# Patient Record
Sex: Female | Born: 1960 | Race: Black or African American | Hispanic: No | Smoking: Never smoker
Health system: Southern US, Community
[De-identification: ages and names within clinical notes are randomized; demographics above are authoritative.]

## PROBLEM LIST (undated history)

## (undated) DIAGNOSIS — M5430 Sciatica, unspecified side: Secondary | ICD-10-CM

## (undated) DIAGNOSIS — R112 Nausea with vomiting, unspecified: Secondary | ICD-10-CM

## (undated) DIAGNOSIS — Z9889 Other specified postprocedural states: Secondary | ICD-10-CM

## (undated) DIAGNOSIS — E559 Vitamin D deficiency, unspecified: Secondary | ICD-10-CM

## (undated) DIAGNOSIS — M549 Dorsalgia, unspecified: Secondary | ICD-10-CM

## (undated) DIAGNOSIS — M5010 Cervical disc disorder with radiculopathy, unspecified cervical region: Secondary | ICD-10-CM

## (undated) HISTORY — PX: ABDOMINAL HYSTERECTOMY: SHX81

## (undated) HISTORY — DX: Vitamin D deficiency, unspecified: E55.9

## (undated) HISTORY — DX: Dorsalgia, unspecified: M54.9

## (undated) HISTORY — PX: FOOT SURGERY: SHX648

---

## 1999-04-29 ENCOUNTER — Other Ambulatory Visit: Admission: RE | Admit: 1999-04-29 | Discharge: 1999-04-29 | Payer: Self-pay | Admitting: Obstetrics and Gynecology

## 2000-05-02 ENCOUNTER — Other Ambulatory Visit: Admission: RE | Admit: 2000-05-02 | Discharge: 2000-05-02 | Payer: Self-pay | Admitting: Obstetrics and Gynecology

## 2001-01-19 ENCOUNTER — Encounter: Admission: RE | Admit: 2001-01-19 | Discharge: 2001-01-19 | Payer: Self-pay | Admitting: Emergency Medicine

## 2001-01-19 ENCOUNTER — Encounter: Payer: Self-pay | Admitting: Emergency Medicine

## 2001-04-12 ENCOUNTER — Other Ambulatory Visit: Admission: RE | Admit: 2001-04-12 | Discharge: 2001-04-12 | Payer: Self-pay | Admitting: Obstetrics and Gynecology

## 2001-10-26 ENCOUNTER — Encounter: Admission: RE | Admit: 2001-10-26 | Discharge: 2001-10-26 | Payer: Self-pay | Admitting: Emergency Medicine

## 2001-10-26 ENCOUNTER — Encounter: Payer: Self-pay | Admitting: Emergency Medicine

## 2002-04-23 ENCOUNTER — Other Ambulatory Visit: Admission: RE | Admit: 2002-04-23 | Discharge: 2002-04-23 | Payer: Self-pay | Admitting: Obstetrics and Gynecology

## 2003-05-22 ENCOUNTER — Other Ambulatory Visit: Admission: RE | Admit: 2003-05-22 | Discharge: 2003-05-22 | Payer: Self-pay | Admitting: Obstetrics and Gynecology

## 2003-07-20 ENCOUNTER — Encounter: Admission: RE | Admit: 2003-07-20 | Discharge: 2003-07-20 | Payer: Self-pay | Admitting: Emergency Medicine

## 2003-08-05 ENCOUNTER — Encounter: Admission: RE | Admit: 2003-08-05 | Discharge: 2003-08-05 | Payer: Self-pay | Admitting: Emergency Medicine

## 2003-08-19 ENCOUNTER — Encounter: Admission: RE | Admit: 2003-08-19 | Discharge: 2003-08-19 | Payer: Self-pay | Admitting: Emergency Medicine

## 2003-09-05 ENCOUNTER — Encounter: Admission: RE | Admit: 2003-09-05 | Discharge: 2003-09-05 | Payer: Self-pay | Admitting: Emergency Medicine

## 2003-11-06 ENCOUNTER — Other Ambulatory Visit: Admission: RE | Admit: 2003-11-06 | Discharge: 2003-11-06 | Payer: Self-pay | Admitting: Obstetrics and Gynecology

## 2004-03-02 ENCOUNTER — Other Ambulatory Visit: Admission: RE | Admit: 2004-03-02 | Discharge: 2004-03-02 | Payer: Self-pay | Admitting: Obstetrics and Gynecology

## 2004-08-07 ENCOUNTER — Other Ambulatory Visit: Admission: RE | Admit: 2004-08-07 | Discharge: 2004-08-07 | Payer: Self-pay | Admitting: Obstetrics and Gynecology

## 2005-01-20 ENCOUNTER — Encounter: Admission: RE | Admit: 2005-01-20 | Discharge: 2005-01-20 | Payer: Self-pay | Admitting: Emergency Medicine

## 2005-02-04 ENCOUNTER — Encounter: Admission: RE | Admit: 2005-02-04 | Discharge: 2005-02-04 | Payer: Self-pay | Admitting: Emergency Medicine

## 2005-02-22 ENCOUNTER — Other Ambulatory Visit: Admission: RE | Admit: 2005-02-22 | Discharge: 2005-02-22 | Payer: Self-pay | Admitting: Obstetrics and Gynecology

## 2005-03-02 ENCOUNTER — Encounter: Admission: RE | Admit: 2005-03-02 | Discharge: 2005-03-02 | Payer: Self-pay | Admitting: Emergency Medicine

## 2005-06-10 ENCOUNTER — Encounter: Admission: RE | Admit: 2005-06-10 | Discharge: 2005-06-10 | Payer: Self-pay | Admitting: Emergency Medicine

## 2006-11-01 ENCOUNTER — Encounter: Admission: RE | Admit: 2006-11-01 | Discharge: 2006-11-01 | Payer: Self-pay | Admitting: Obstetrics and Gynecology

## 2007-04-21 ENCOUNTER — Encounter: Admission: RE | Admit: 2007-04-21 | Discharge: 2007-04-21 | Payer: Self-pay | Admitting: Obstetrics and Gynecology

## 2007-10-25 ENCOUNTER — Encounter: Admission: RE | Admit: 2007-10-25 | Discharge: 2007-10-25 | Payer: Self-pay | Admitting: Obstetrics and Gynecology

## 2008-10-29 ENCOUNTER — Encounter: Admission: RE | Admit: 2008-10-29 | Discharge: 2008-10-29 | Payer: Self-pay | Admitting: Obstetrics and Gynecology

## 2009-10-30 ENCOUNTER — Encounter: Admission: RE | Admit: 2009-10-30 | Discharge: 2009-10-30 | Payer: Self-pay | Admitting: Obstetrics and Gynecology

## 2010-05-14 ENCOUNTER — Other Ambulatory Visit: Payer: Self-pay | Admitting: Obstetrics and Gynecology

## 2010-05-14 DIAGNOSIS — Z139 Encounter for screening, unspecified: Secondary | ICD-10-CM

## 2010-11-02 ENCOUNTER — Ambulatory Visit
Admission: RE | Admit: 2010-11-02 | Discharge: 2010-11-02 | Disposition: A | Discharge status: Home / Self Care | Payer: 59 | Source: Ambulatory Visit | Attending: Obstetrics and Gynecology | Admitting: Obstetrics and Gynecology

## 2010-11-02 DIAGNOSIS — Z139 Encounter for screening, unspecified: Secondary | ICD-10-CM

## 2011-09-30 ENCOUNTER — Other Ambulatory Visit: Payer: Self-pay | Admitting: Obstetrics and Gynecology

## 2011-09-30 DIAGNOSIS — Z1231 Encounter for screening mammogram for malignant neoplasm of breast: Secondary | ICD-10-CM

## 2011-11-03 ENCOUNTER — Ambulatory Visit
Admission: RE | Admit: 2011-11-03 | Discharge: 2011-11-03 | Disposition: A | Discharge status: Home / Self Care | Payer: 59 | Source: Ambulatory Visit | Attending: Obstetrics and Gynecology | Admitting: Obstetrics and Gynecology

## 2011-11-03 DIAGNOSIS — Z1231 Encounter for screening mammogram for malignant neoplasm of breast: Secondary | ICD-10-CM

## 2012-05-24 ENCOUNTER — Other Ambulatory Visit (HOSPITAL_COMMUNITY)
Admission: RE | Admit: 2012-05-24 | Discharge: 2012-05-24 | Disposition: A | Discharge status: Home / Self Care | Payer: 59 | Source: Ambulatory Visit | Attending: Obstetrics and Gynecology | Admitting: Obstetrics and Gynecology

## 2012-05-24 ENCOUNTER — Other Ambulatory Visit: Payer: Self-pay | Admitting: Obstetrics and Gynecology

## 2012-05-24 DIAGNOSIS — Z01419 Encounter for gynecological examination (general) (routine) without abnormal findings: Secondary | ICD-10-CM | POA: Insufficient documentation

## 2012-05-24 DIAGNOSIS — Z1151 Encounter for screening for human papillomavirus (HPV): Secondary | ICD-10-CM | POA: Insufficient documentation

## 2012-09-25 ENCOUNTER — Ambulatory Visit
Admission: RE | Admit: 2012-09-25 | Discharge: 2012-09-25 | Disposition: A | Discharge status: Home / Self Care | Payer: 59 | Source: Ambulatory Visit | Attending: Family Medicine | Admitting: Family Medicine

## 2012-09-25 ENCOUNTER — Ambulatory Visit (INDEPENDENT_AMBULATORY_CARE_PROVIDER_SITE_OTHER): Payer: 59 | Admitting: Sports Medicine

## 2012-09-25 VITALS — BP 115/78 | Ht 64.0 in | Wt 163.0 lb

## 2012-09-25 DIAGNOSIS — M542 Cervicalgia: Secondary | ICD-10-CM

## 2012-09-25 DIAGNOSIS — M545 Low back pain: Secondary | ICD-10-CM

## 2012-09-25 MED ORDER — CYCLOBENZAPRINE HCL 10 MG PO TABS: 10.0000 mg | ORAL_TABLET | Freq: Every evening | ORAL | Status: DC | PRN

## 2012-09-25 MED ORDER — KETOROLAC TROMETHAMINE 60 MG/2ML IM SOLN
60.0000 mg | Freq: Once | INTRAMUSCULAR | Status: AC
  Administered 2012-09-25: 60 mg via INTRAMUSCULAR

## 2012-09-25 MED ORDER — METHYLPREDNISOLONE ACETATE 80 MG/ML IJ SUSP
80.0000 mg | Freq: Once | INTRAMUSCULAR | Status: AC
  Administered 2012-09-25: 80 mg via INTRAMUSCULAR

## 2012-09-25 NOTE — Patient Instructions (Addendum)
Dear Mrs. Melissa Roth,   Thank you for coming in today. Please read the below about the issues that we discussed.  1. Neck pain - this is likely related to arthritis of the neck. We will get an x-ray to evaluate the bones of the neck. Additionally I will prescribed a muscle relaxer that you can take at night to help sleep. Also, I will provided note to you can take to work stating that you need a headset to use to decrease your neck pain.  2. Back pain - get another x-ray of your back today to evaluate the arthritis that could be causing the pain. Hopefully you will get some relief from the shot that you received today. We will contact you with the results of the x-ray.  Sincerely,   Dr. Margaretha Sheffield and Dr. Clinton Sawyer

## 2012-09-25 NOTE — Assessment & Plan Note (Signed)
Assessment: Chronic low back pain likely due to facet arthritis and degenerative disc disease but no sign of radiculopathy Plan: Obtain plain films of lumbar spine to assess degenerative disc disease and facet arthritis and likely recommend repeat steroid injections or facet since it was very helpful the past; patient also given IM injections of Depo-Medrol 80 mg and Toradol 60 mg for immediate pain relief today

## 2012-09-25 NOTE — Assessment & Plan Note (Signed)
Assessment: Paraspinal muscle pain likely due to positioning at work and also arthritis of cervical spine without evidence of radiculopathy or impingement Plan: obtain plain film x-rays of cervical spine, give prescription for Flexeril 10 mg each bedtime when necessary, provide a note for work recommending hands-free headset to use

## 2012-09-25 NOTE — Progress Notes (Signed)
  Subjective:    Patient ID: Melissa Roth, female    DOB: Mar 04, 1960, 52 y.o.   MRN: [id number]  HPI  52 year old female who presents for new patient visit for evaluation of lower back pain and neck pain.  Low back pain: Patient notes she had a history of low back pain since 2005. At that time she had a bulging disc seen on MRI. She has had injections of her spine with steroids which is very helpful. She did not have any pain for several years thereafter. However she started having worsening pain began over the last year. She describes the pain as being located in the lower back on both sides. It does not radiate to her buttocks or legs. It is worsened by sitting for prolonged period of time. She has seen a chiropractor in [place] for this problem. She feels like her treatment from a chiropractor had helped her for a few weeks at a time. However then the pain recurs. She denies any weakness of her legs any numbness or tingling of her perineum and no bowel or bladder dysfunction. She also denies any recent imaging.  Neck pain: As also started approximately a year ago. It is located in the muscles of her neck bilaterally. It does not radiate to her shoulders or her arms. She denies any numbness tingling or weakness of her upper exteremities. She thinks it is worsened by talking on the phone at work. She is a Science writer for [employer]. She has a talking on the phone for several hours a day makes it worse. It is mildly relieved by naproxen. She is not on any pain medications or anti-inflammatories.  Past medical history:  Degenerative disc disease at L4-L5 Disc herniation L3-L4  Facet arthritis  Past surgical history: Epidural steroid injections in 2005  SI joint injection in 2005 2007  facet joint injections in 2000 5007  No current outpatient prescriptions on file prior to visit.   No current facility-administered medications on file prior to visit.      Review of  Systems See history of present illness    Objective:   Physical Exam  BP 115/78  Ht 5\' 4"  (1.626 m)  Wt 163 lb (73.936 kg)  BMI 27.97 kg/m2 Gen. Middle-aged [place] American female, well-appearing, pleasant and conversant, overweight  Back:  Appearance: sciolosis no Palpation: tenderness of paraspinal muscles in the cervical and lumbar regions, spinous process no; pelvis no  Flexion: limited by tightness of muscles Extension: markedly limited by pain in the lumbar region FADIR: negative FABER: negative for pain but limited by range of motion  Neck:  Decreased flexion of the neck, normal extension left and right lateral rotations, negative Spurling bilaterally, tenderness palpation of paraspinal muscles and superior trapezius  Neuro: Strength bilateral hip flexion 5/5, hip abduction 5/5, hip adduction 5/5,  knee extension 5/5, knee flexion 5/5, dorsiflexion 5/5, plantar flexion 5/5 Reflexes: patella 2/2 Bilateral  Achilles 2/2 Bilateral Straight Leg Raise: negative Sensation to light touch: yes  Skin: warm and dry and no rashes      Assessment & Plan:

## 2012-09-27 ENCOUNTER — Encounter: Payer: Self-pay | Admitting: *Deleted

## 2012-09-27 ENCOUNTER — Telehealth: Payer: Self-pay | Admitting: *Deleted

## 2012-09-27 ENCOUNTER — Telehealth: Payer: Self-pay | Admitting: Sports Medicine

## 2012-09-27 DIAGNOSIS — M545 Low back pain: Secondary | ICD-10-CM

## 2012-09-27 NOTE — Telephone Encounter (Signed)
Message copied by Mora Bellman on Wed Sep 27, 2012  4:12 PM ------      Message from: Annita Brod      Created: Wed Sep 27, 2012  4:08 PM      Regarding: FW: voicemail message      Contact: [phone]                   ----- Message -----         From: Mora Bellman, RN         Sent: 09/27/2012   4:04 PM           To: Lillia Pauls, CMA      Subject: FW: voicemail message                                                ----- Message -----         From: [name], DO         Sent: 09/27/2012   3:57 PM           To: Mora Bellman, RN      Subject: FW: voicemail message                                    Pain could be coming from her neck were she has some degenerative changes.      Also tell her that we can proceed with repeat facet injections in her lumbar spine without pursuing a new MRI. We are setting that up at [facility] imaging and I would like to see her back after the injections.            ----- Message -----         From: Lillia Pauls, CMA         Sent: 09/27/2012   1:32 PM           To: [name], DO      Subject: FW: voicemail message                                                ----- Message -----         From: Lizbeth Bark         Sent: 09/27/2012   1:20 PM           To: Lillia Pauls, CMA      Subject: voicemail message                                        Pt called stating her left arm is painful.  She saw Dr. Margaretha Sheffield and wants to know if this could be caused by her back and neck pain.       ------

## 2012-09-27 NOTE — Telephone Encounter (Signed)
Spoke with pt- advised her of below message from Dr. Margaretha Sheffield.

## 2012-09-27 NOTE — Telephone Encounter (Signed)
Patient has evidence of C5-C6 degenerative disc disease in her neck as well as facet arthropathy in her lumbar spine. We spoke with [facility] imaging and they're willing to proceed with repeat lumbar facet injections without repeating an MRI. He seemed to get the most benefit from bilateral L3-L4 and L4-L5 injections previously. Patient will followup with me after those injections for check on her progress.

## 2012-09-27 NOTE — Progress Notes (Signed)
Patient ID: Melissa Roth, female   DOB: May 08, 1960, 52 y.o.   MRN: [id number]   Ordered per Dr. Margaretha Sheffield.  Sent to [place] at [facility].  She will call pt to schedule.

## 2012-09-29 ENCOUNTER — Other Ambulatory Visit: Payer: Self-pay | Admitting: Sports Medicine

## 2012-09-29 DIAGNOSIS — M545 Low back pain, unspecified: Secondary | ICD-10-CM

## 2012-10-03 ENCOUNTER — Other Ambulatory Visit: Payer: Self-pay

## 2012-10-03 DIAGNOSIS — Z1231 Encounter for screening mammogram for malignant neoplasm of breast: Secondary | ICD-10-CM

## 2012-10-05 ENCOUNTER — Other Ambulatory Visit: Payer: Self-pay | Admitting: Sports Medicine

## 2012-10-05 ENCOUNTER — Ambulatory Visit
Admission: RE | Admit: 2012-10-05 | Discharge: 2012-10-05 | Disposition: A | Discharge status: Home / Self Care | Payer: 59 | Source: Ambulatory Visit | Attending: Sports Medicine | Admitting: Sports Medicine

## 2012-10-05 VITALS — BP 107/59 | HR 63

## 2012-10-05 DIAGNOSIS — M545 Low back pain, unspecified: Secondary | ICD-10-CM

## 2012-10-05 MED ORDER — IOHEXOL 180 MG/ML  SOLN
1.0000 mL | Freq: Once | INTRAMUSCULAR | Status: AC | PRN
  Administered 2012-10-05: 4 mL via INTRA_ARTICULAR

## 2012-10-05 MED ORDER — METHYLPREDNISOLONE ACETATE 40 MG/ML INJ SUSP (RADIOLOG
120.0000 mg | Freq: Once | INTRAMUSCULAR | Status: AC
  Administered 2012-10-05: 120 mg via INTRA_ARTICULAR

## 2012-10-09 ENCOUNTER — Ambulatory Visit: Payer: 59 | Admitting: Sports Medicine

## 2012-10-16 ENCOUNTER — Ambulatory Visit (INDEPENDENT_AMBULATORY_CARE_PROVIDER_SITE_OTHER): Payer: 59 | Admitting: Sports Medicine

## 2012-10-16 VITALS — BP 116/78 | Ht 63.0 in | Wt 157.0 lb

## 2012-10-16 DIAGNOSIS — M25512 Pain in left shoulder: Secondary | ICD-10-CM

## 2012-10-16 DIAGNOSIS — M25519 Pain in unspecified shoulder: Secondary | ICD-10-CM

## 2012-10-16 MED ORDER — METHYLPREDNISOLONE ACETATE 40 MG/ML IJ SUSP
40.0000 mg | Freq: Once | INTRAMUSCULAR | Status: AC
  Administered 2012-10-16: 40 mg via INTRA_ARTICULAR

## 2012-10-17 NOTE — Progress Notes (Signed)
  Subjective:    Patient ID: Melissa Roth, female    DOB: Nov 07, 1960, 52 y.o.   MRN: [id number]  HPI chief complaint: Left shoulder pain  Patient comes in today with persistent left shoulder pain. Recent x-rays of her cervical spine showed moderate degenerative disc disease at C5-C6. X-rays of her lumbar spine showed facet arthropathy and she has responded very well to facet injections thus far. In regards to her left shoulder she is getting pain mainly along the posterior and lateral aspect of the shoulder which is most pronounced with reaching overhead or weight from her body. She denies any numbness or tingling down the left arm. He does get intermittent neck pain. We had recommended to her employer that she have a headset for work but they have yet to provide her with one. No recent trauma. She takes an occasional Flexeril at night.  Interim medical history is unchanged     Review of Systems     Objective:   Physical Exam Well-developed, well-nourished. No acute distress Cervical spine: She has good rotation to the left and right but pain with flexion of the neck. There is no tenderness to palpation along the lumbar midline but there is some tenderness along the left trapezius as well as the periscapular area. No gross focal neurological deficits of either upper extremity. Left shoulder: Active forward flexion is limited to 100 before pain limits any further movement. Active abduction is also to 100 before pain limits further movement. Good internal and external rotation. Positive empty can, positive Hawkins. Rotator cuff strength is 5/5 bilaterally. No tenderness over the bicipital groove.       Assessment & Plan:  Left shoulder pain likely secondary to rotator cuff tendinitis/bursitis versus referred pain from cervical degenerative disc disease Improved low back pain secondary to facet arthropathy status post facet injections  For diagnostic as well as therapeutic reasons I  recommended a left subacromial cortisone injection. If pain persists there after I may need to consider merits of further diagnostic imaging in the form of ultrasound of her left shoulder and possibly MRI of her cervical spine. She will followup for ongoing or recalcitrant issues.  Consent obtained and verified. Time-out conducted. Noted no overlying erythema, induration, or other signs of local infection. Skin prepped in a sterile fashion. Topical analgesic spray: Ethyl chloride. Joint: left subacromial space Needle: 25g 1.5 inch Completed without difficulty. Meds: 1cc (40mg ) depomedrol, 3cc 1% xylocaine  Advised to call if fevers/chills, erythema, induration, drainage, or persistent bleeding.

## 2012-11-03 ENCOUNTER — Ambulatory Visit
Admission: RE | Admit: 2012-11-03 | Discharge: 2012-11-03 | Disposition: A | Discharge status: Home / Self Care | Payer: 59 | Source: Ambulatory Visit

## 2012-11-03 DIAGNOSIS — Z1231 Encounter for screening mammogram for malignant neoplasm of breast: Secondary | ICD-10-CM

## 2012-11-07 ENCOUNTER — Other Ambulatory Visit: Payer: Self-pay | Admitting: Obstetrics and Gynecology

## 2012-11-07 DIAGNOSIS — R928 Other abnormal and inconclusive findings on diagnostic imaging of breast: Secondary | ICD-10-CM

## 2012-11-24 ENCOUNTER — Ambulatory Visit
Admission: RE | Admit: 2012-11-24 | Discharge: 2012-11-24 | Disposition: A | Discharge status: Home / Self Care | Payer: 59 | Source: Ambulatory Visit | Attending: Obstetrics and Gynecology | Admitting: Obstetrics and Gynecology

## 2012-11-24 DIAGNOSIS — R928 Other abnormal and inconclusive findings on diagnostic imaging of breast: Secondary | ICD-10-CM

## 2012-12-20 ENCOUNTER — Encounter: Payer: Self-pay | Admitting: Family Medicine

## 2012-12-20 ENCOUNTER — Ambulatory Visit (INDEPENDENT_AMBULATORY_CARE_PROVIDER_SITE_OTHER): Payer: 59 | Admitting: Family Medicine

## 2012-12-20 ENCOUNTER — Encounter (INDEPENDENT_AMBULATORY_CARE_PROVIDER_SITE_OTHER): Payer: Self-pay

## 2012-12-20 VITALS — BP 113/69 | HR 73 | Ht 63.0 in | Wt 150.0 lb

## 2012-12-20 DIAGNOSIS — M25512 Pain in left shoulder: Secondary | ICD-10-CM

## 2012-12-20 DIAGNOSIS — M25519 Pain in unspecified shoulder: Secondary | ICD-10-CM

## 2012-12-20 NOTE — Patient Instructions (Signed)
You have rotator cuff impingement Try to avoid painful activities (overhead activities, lifting with extended arm) as much as possible. Aleve 2 tabs twice a day with food OR ibuprofen 3 tabs three times a day with food for pain and inflammation. Can take tylenol in addition to this. Subacromial injection may be beneficial to help with pain and to decrease inflammation - we repeated this today. Do home exercise program with theraband and scapular stabilization exercises daily - these are very important for long term relief even if an injection was given - 3 sets of 10 once a day of each. Consider formal physical therapy. Consider nitro patches. Call me if you want to do either of these. Otherwise follow up in the [place] office in 6 weeks.

## 2012-12-24 ENCOUNTER — Encounter: Payer: Self-pay | Admitting: Family Medicine

## 2012-12-24 NOTE — Progress Notes (Signed)
Patient ID: Melissa Roth, female   DOB: 1960-11-08, 52 y.o.   MRN: [id number]  PCP: No primary provider on file.  Subjective:   HPI: Patient is a 52 y.o. female here for left shoulder pain.  Patient denies known injury. States she's having problems reaching behind and overhead. Had subacromial injection in [place] office in past which helped her. Has not done PT or a home exercise program. Has known facet arthropathy of cervical spine as well. States painful to dress. Massage helped. + night pain. No numbness/tingling.  History reviewed. No pertinent past medical history.  Current Outpatient Prescriptions on File Prior to Visit  Medication Sig Dispense Refill  . cyclobenzaprine (FLEXERIL) 10 MG tablet Take 1 tablet (10 mg total) by mouth at bedtime as needed for muscle spasms.  30 tablet  0   No current facility-administered medications on file prior to visit.    Past Surgical History  Procedure Laterality Date  . Abdominal hysterectomy      Allergies  Allergen Reactions  . Ivp Dye [Iodinated Diagnostic Agents]   . Sulfa Antibiotics     History   Social History  . Marital Status: Single    Spouse Name: N/A    Number of Children: N/A  . Years of Education: N/A   Occupational History  . Not on file.   Social History Main Topics  . Smoking status: Never Smoker   . Smokeless tobacco: Not on file  . Alcohol Use: Not on file  . Drug Use: Not on file  . Sexual Activity: Not on file   Other Topics Concern  . Not on file   Social History Narrative  . No narrative on file    Family History  Problem Relation Age of Onset  . Hyperlipidemia Mother   . Hypertension Mother   . Hypertension Father   . Hyperlipidemia Father   . Heart attack Father   . Hyperlipidemia Brother   . Hypertension Brother   . Diabetes Neg Hx   . Sudden death Neg Hx     BP 113/69  Pulse 73  Ht 5\' 3"  (1.6 m)  Wt 150 lb (68.04 kg)  BMI 26.58 kg/m2  Review of Systems: See  HPI above.    Objective:  Physical Exam:  Gen: NAD  Left shoulder: No swelling, ecchymoses.  No gross deformity. No TTP AC joint, biceps tendon. FROM with painful arc. Positive Hawkins, Neers. Negative Yergasons. Strength 4/5 with empty can, painful.  Negative resisted internal/external rotation, 5/5 strength. Negative apprehension. NV intact distally.    Assessment & Plan:  1. Left shoulder rotator cuff impingement - Start home exercise program.  Declined formal PT for now.  Repeated subacromial injection - did well with this in the past.  NSAIDs, avoid painful activities.  Consider PT, nitro patches if not improving.  F/u in 6 weeks.  After informed written consent, patient was seated on exam table. Left shoulder was prepped with alcohol swab and utilizing posterior approach, patient's left subacromial space was injected with 3:1 marcaine: depomedrol. Patient tolerated the procedure well without immediate complications.

## 2012-12-26 DIAGNOSIS — M25512 Pain in left shoulder: Secondary | ICD-10-CM | POA: Insufficient documentation

## 2012-12-26 NOTE — Assessment & Plan Note (Signed)
Left shoulder rotator cuff impingement - Start home exercise program.  Declined formal PT for now.  Repeated subacromial injection - did well with this in the past.  NSAIDs, avoid painful activities.  Consider PT, nitro patches if not improving.  F/u in 6 weeks.  After informed written consent, patient was seated on exam table. Left shoulder was prepped with alcohol swab and utilizing posterior approach, patient's left subacromial space was injected with 3:1 marcaine: depomedrol. Patient tolerated the procedure well without immediate complications.

## 2013-01-31 ENCOUNTER — Ambulatory Visit: Payer: 59 | Admitting: Sports Medicine

## 2013-02-06 ENCOUNTER — Ambulatory Visit: Payer: 59 | Admitting: Sports Medicine

## 2013-02-06 ENCOUNTER — Encounter: Payer: Self-pay | Admitting: Sports Medicine

## 2013-02-06 ENCOUNTER — Ambulatory Visit (INDEPENDENT_AMBULATORY_CARE_PROVIDER_SITE_OTHER): Payer: Managed Care, Other (non HMO) | Admitting: Sports Medicine

## 2013-02-06 VITALS — BP 116/78 | Ht 64.0 in | Wt 149.0 lb

## 2013-02-06 DIAGNOSIS — M25519 Pain in unspecified shoulder: Secondary | ICD-10-CM

## 2013-02-06 NOTE — Patient Instructions (Signed)
You have been scheduled for appointment for MRI of you shoulder on 02/08/13 at 9 am [facility] [address]  [phone]

## 2013-02-07 NOTE — Progress Notes (Signed)
   Subjective:    Patient ID: Melissa Roth, female    DOB: 1960/04/27, 53 y.o.   MRN: [id number]  HPI Patient comes in today with returning left shoulder pain. Pain has been present now for several months. She does receive some improvement with subacromial cortisone injections but her pain never completely resolves. She has had 2 injections over the past 6 months. She localizes her pain to the lateral shoulder. It is most noticeable with shoulder related activity such as reaching overhead or away from her body. She also gets intermittent neck pain. She has been taking over-the-counter Aleve for several weeks and participating in a home exercise program. Despite this her symptoms persist. She is getting weakness as well. She denies any recent trauma.  Past medical history and current medications are reviewed    Review of Systems     Objective:   Physical Exam Well-developed, well-nourished. No acute distress. Awake alert and oriented x3. Vital signs are reviewed.  Shoulder: Active forward flexion and abduction to 90. Passively I'm able to. Her to about 120. Positive empty can, positive Hawkins. Rotator cuff strength is 5/5 but reproducible of pain with resisted supraspinatus. No tenderness to palpation over the a.c. joint or over the bicipital groove. Good passive external rotation. Neurovascularly intact distally.       Assessment & Plan:  Persistent left shoulder pain worrisome for rotator cuff tear  MRI of the left shoulder specifically to rule out a rotator cuff tear which may need operative intervention. I will call her with those results once available. We will delineate further treatment based on those results.

## 2013-02-08 ENCOUNTER — Ambulatory Visit
Admission: RE | Admit: 2013-02-08 | Discharge: 2013-02-08 | Disposition: A | Discharge status: Home / Self Care | Payer: Managed Care, Other (non HMO) | Source: Ambulatory Visit | Attending: Sports Medicine | Admitting: Sports Medicine

## 2013-02-08 DIAGNOSIS — M25519 Pain in unspecified shoulder: Secondary | ICD-10-CM

## 2013-02-12 ENCOUNTER — Other Ambulatory Visit: Payer: Self-pay | Admitting: Sports Medicine

## 2013-02-12 ENCOUNTER — Telehealth: Payer: Self-pay | Admitting: Sports Medicine

## 2013-02-12 DIAGNOSIS — M25519 Pain in unspecified shoulder: Secondary | ICD-10-CM

## 2013-02-12 MED ORDER — MELOXICAM 15 MG PO TABS: 15.0000 mg | ORAL_TABLET | Freq: Every day | ORAL | Status: DC

## 2013-02-12 NOTE — Telephone Encounter (Signed)
I spoke with the patient on the phone after reviewing the MRI findings of her left shoulder. There is no evidence of rotator cuff tear. She does have some edema in the infraspinatus and teres minor muscles consistent with a strain here. Based on these findings I recommended that we start Mobic 15 mg daily and start with some physical therapy. Patient is to followup with me in 6 weeks. If she is able to regain better shoulder mobility but continues to complain of neck and arm pain then I may need to consider the merits of an MRI scan of her cervical spine to rule out referred pain from her cervical degenerative disc disease. She is reassured that she has no surgical pathology in her left shoulder.

## 2013-02-12 NOTE — Telephone Encounter (Signed)
Message copied by Ralene CorkRAPER, TIMOTHY R on Mon Feb 12, 2013  2:35 PM ------      Message from: Annita BrodMOORE, NEETON C      Created: Mon Feb 12, 2013 10:44 AM      Regarding: FW: mri results      Contact: [phone]                   ----- Message -----         From: Lizbeth BarkMelanie L Ceresi         Sent: 02/12/2013  10:19 AM           To: Lillia PaulsNeeton Moore, CMA      Subject: mri results                                              Pt called to get results of Left arm/shoulder mri ------

## 2013-02-27 ENCOUNTER — Ambulatory Visit: Payer: Managed Care, Other (non HMO) | Attending: Sports Medicine

## 2013-03-28 ENCOUNTER — Encounter: Payer: Self-pay | Admitting: Sports Medicine

## 2013-03-28 ENCOUNTER — Ambulatory Visit (INDEPENDENT_AMBULATORY_CARE_PROVIDER_SITE_OTHER): Payer: Managed Care, Other (non HMO) | Admitting: Sports Medicine

## 2013-03-28 VITALS — BP 109/68 | Ht 63.0 in | Wt 150.0 lb

## 2013-03-28 DIAGNOSIS — M5412 Radiculopathy, cervical region: Secondary | ICD-10-CM

## 2013-03-28 DIAGNOSIS — M5010 Cervical disc disorder with radiculopathy, unspecified cervical region: Secondary | ICD-10-CM

## 2013-03-28 DIAGNOSIS — M542 Cervicalgia: Secondary | ICD-10-CM

## 2013-03-28 MED ORDER — PREDNISONE 10 MG PO KIT: PACK | ORAL | Status: DC

## 2013-03-28 MED ORDER — GABAPENTIN 300 MG PO CAPS: 300.0000 mg | ORAL_CAPSULE | Freq: Every day | ORAL | Status: DC

## 2013-03-28 NOTE — Progress Notes (Signed)
   Subjective:    Patient ID: Melissa Roth, female    DOB: 01-19-1960, 53 y.o.   MRN: [id number]  HPI Patient comes in today to discuss MRI findings of her left shoulder. Other than findings to suggest a mild teres minor and infraspinatus muscle strain, the scan is basically unremarkable. No rotator cuff tear. No adhesive capsulitis. Biceps tendon is normal. No labral tear. No bursitis. She continues to struggle with debilitating diffuse pain in the left shoulder. Pain will radiate down her arm into her hand. There is associated numbness and tingling intermittently. Some weakness as well. Also some left-sided neck pain. Previous x-rays of her cervical spine showed advanced degenerative disc disease at C5-C6 with disc space narrowing and osteophyte formation. Cyclobenzaprine was somewhat helpful, but other medications including meloxicam have not been of any benefit.    Review of Systems As above    Objective:   Physical Exam Well-developed, well-nourished. No acute distress.  Left shoulder: There continues to be limited range of motion in all planes secondary to pain. She has good passive external rotation and abduction. Global weakness secondary to pain. Neurological exam: Reflexes appear to be trace but equal at the biceps, triceps, and brachial radialis tendons bilaterally. No atrophy. Again, there is diffuse weakness of the left upper extremity secondary to pain. Good radial and ulnar pulses.       Assessment & Plan:  Persistent diffuse left shoulder and arm pain, likely due to C5-C6 radiculopathy  Shoulder MRI is basically unremarkable and does not explain her level of pain. Therefore, I want to get an MRI scan of her cervical spine specifically to evaluate for a disc herniation at C5-C6. I will place her on a 6 day Sterapred Dosepak to take as directed and we will start Neurontin 300 mg at bedtime. I have discussed the possibility of switching back to cyclobenzaprine if the Neurontin  is ineffective and I also discussed the possibility of adding tramadol. I will call her after I reviewed the MRI of her cervical spine. If still struggling, future treatment could include cervical ESI's, physical therapy, or referral to neurosurgery.

## 2013-03-28 NOTE — Patient Instructions (Signed)
You have been scheduled for a MRI of your cervical spine on 03/31/13 at 6:30 am  [facility]  [address] [place]  [phone]  Gabapentin and prednisone dose pack [facility]

## 2013-03-31 ENCOUNTER — Ambulatory Visit
Admission: RE | Admit: 2013-03-31 | Discharge: 2013-03-31 | Disposition: A | Discharge status: Home / Self Care | Payer: Managed Care, Other (non HMO) | Source: Ambulatory Visit | Attending: Sports Medicine | Admitting: Sports Medicine

## 2013-03-31 DIAGNOSIS — M542 Cervicalgia: Secondary | ICD-10-CM

## 2013-04-05 ENCOUNTER — Encounter: Payer: Self-pay | Admitting: *Deleted

## 2013-04-05 NOTE — Patient Instructions (Signed)
DR Rosine [facility]LANDA[facility] [address] [place] [phone] MON APR 6TH AT 945A

## 2013-04-06 ENCOUNTER — Telehealth: Payer: Self-pay | Admitting: Sports Medicine

## 2013-04-06 NOTE — Telephone Encounter (Signed)
  Patient returns to the office today at my request. An MRI of her cervical spine shows moderate central stenosis at C5-C6 with broad-based disc osteophyte resulting in bilateral foraminal stenosis, left greater than right. The patient's radicular symptoms have improved with prednisone and Neurontin but her biggest complaint continues to be limited shoulder range of motion particularly internal rotation. Her shoulder exam today shows limited external rotation to about 70 but limited passive abduction to about 90. A recent MRI scan of her left shoulder showed only some mild muscle straining of the teres minor and infraspinatus muscles. No evidence of rotator cuff pathology or labral pathology.  Although the patient's MRI of the shoulder is fairly unremarkable, clinically I think she is getting adhesive capsulitis. I also think some of her symptoms, particularly her neck pain and radiating numbness and tingling into her hand, are a result of the degenerative cervical spine changes at C5-C6. Since her radicular symptoms seem to improve and her main complaint continues to be limited shoulder mobility I recommended consultation with Dr. Dion SaucierLandau to see if he agrees that there may be an element of adhesive capsulitis present. I had previously discussed the possibility of a cervical epidural steroid injection but I will await Dr. Shelba FlakeLandau's input prior to ordering this. I've asked the patient to contact me after her appointment with Dr. Dion SaucierLandau.

## 2013-04-13 ENCOUNTER — Ambulatory Visit (INDEPENDENT_AMBULATORY_CARE_PROVIDER_SITE_OTHER): Payer: Managed Care, Other (non HMO) | Admitting: Sports Medicine

## 2013-04-13 ENCOUNTER — Encounter: Payer: Self-pay | Admitting: Sports Medicine

## 2013-04-13 VITALS — BP 117/78 | HR 69 | Ht 63.0 in | Wt 150.0 lb

## 2013-04-13 DIAGNOSIS — M5412 Radiculopathy, cervical region: Secondary | ICD-10-CM

## 2013-04-13 DIAGNOSIS — M7500 Adhesive capsulitis of unspecified shoulder: Secondary | ICD-10-CM

## 2013-04-13 DIAGNOSIS — M5010 Cervical disc disorder with radiculopathy, unspecified cervical region: Secondary | ICD-10-CM

## 2013-04-13 NOTE — Progress Notes (Signed)
Patient ID: Melissa Roth, female   DOB: Jun 25, 1960, 53 y.o.   MRN: [id number]  Patient comes in today for a quick followup. She saw Dr. Dion SaucierLandau earlier this week. Dr. Dion SaucierLandau agrees that there is an element of adhesive capsulitis in her left shoulder. He gave her an intra-articular cortisone injection and a comprehensive home exercise program. She is following up with him in 4 weeks. The radiculopathy she was getting in her left arm has resolved. She is still left with some chronic left-sided neck pain however. She states it is intermittent and not every day.  For now, I have recommended that we take a watchful waiting approach in regards to her cervical spine issues. She no doubt has adhesive capsulitis and I think she should address this first with Dr. Dion SaucierLandau. I explained that she will likely have an element of chronic neck pain due to degenerative disc disease but with her absence of radiculopathy I do not think we need to pursue a cervical ESI at this point. However, if things change down the road, she will notify me and I can reconsider this. Patient is not interested in discussing a cervical spine fusion and I am in agreement with this plan.  Follow up PRN

## 2013-10-24 ENCOUNTER — Other Ambulatory Visit: Payer: Self-pay

## 2013-10-24 DIAGNOSIS — Z1239 Encounter for other screening for malignant neoplasm of breast: Secondary | ICD-10-CM

## 2013-11-12 ENCOUNTER — Ambulatory Visit
Admission: RE | Admit: 2013-11-12 | Discharge: 2013-11-12 | Disposition: A | Discharge status: Home / Self Care | Payer: Managed Care, Other (non HMO) | Source: Ambulatory Visit

## 2013-11-12 DIAGNOSIS — Z1239 Encounter for other screening for malignant neoplasm of breast: Secondary | ICD-10-CM

## 2014-10-09 ENCOUNTER — Other Ambulatory Visit: Payer: Self-pay

## 2014-10-09 DIAGNOSIS — Z1231 Encounter for screening mammogram for malignant neoplasm of breast: Secondary | ICD-10-CM

## 2014-11-15 ENCOUNTER — Ambulatory Visit: Payer: Managed Care, Other (non HMO)

## 2014-12-05 ENCOUNTER — Ambulatory Visit
Admission: RE | Admit: 2014-12-05 | Discharge: 2014-12-05 | Disposition: A | Discharge status: Home / Self Care | Payer: Managed Care, Other (non HMO) | Source: Ambulatory Visit

## 2014-12-05 DIAGNOSIS — Z1231 Encounter for screening mammogram for malignant neoplasm of breast: Secondary | ICD-10-CM

## 2015-11-17 ENCOUNTER — Other Ambulatory Visit: Payer: Self-pay | Admitting: Family Medicine

## 2015-11-17 DIAGNOSIS — Z1231 Encounter for screening mammogram for malignant neoplasm of breast: Secondary | ICD-10-CM

## 2015-12-22 ENCOUNTER — Ambulatory Visit
Admission: RE | Admit: 2015-12-22 | Discharge: 2015-12-22 | Disposition: A | Discharge status: Home / Self Care | Payer: Managed Care, Other (non HMO) | Source: Ambulatory Visit | Attending: Family Medicine | Admitting: Family Medicine

## 2015-12-22 DIAGNOSIS — Z1231 Encounter for screening mammogram for malignant neoplasm of breast: Secondary | ICD-10-CM

## 2016-02-02 ENCOUNTER — Ambulatory Visit (INDEPENDENT_AMBULATORY_CARE_PROVIDER_SITE_OTHER): Payer: Managed Care, Other (non HMO) | Admitting: Podiatry

## 2016-02-02 ENCOUNTER — Ambulatory Visit (INDEPENDENT_AMBULATORY_CARE_PROVIDER_SITE_OTHER): Payer: Managed Care, Other (non HMO)

## 2016-02-02 ENCOUNTER — Encounter: Payer: Self-pay | Admitting: Podiatry

## 2016-02-02 VITALS — BP 102/64 | HR 84 | Resp 18

## 2016-02-02 DIAGNOSIS — M722 Plantar fascial fibromatosis: Secondary | ICD-10-CM | POA: Diagnosis not present

## 2016-02-02 MED ORDER — MELOXICAM 15 MG PO TABS: 15.0000 mg | ORAL_TABLET | Freq: Every day | ORAL | 2 refills | Status: DC

## 2016-02-02 NOTE — Progress Notes (Signed)
   Subjective:    Patient ID: Melissa Roth, female    DOB: [birth year], 56 y.o.   MRN: [id number]  HPI  56 year old female presents the office they for concerns of bilateral heel pain which has been ongoing for about 3 weeks. She states the area is painful the morning when she first gets up after standing all day. She denies any recent injury or trauma. She states that she was treated for this several years ago she believes. Denies any swelling or redness. She's had no recent treatment for this. She denies any claudication symptoms. The pain does not wake her up at night. No other complaints at this time.   Review of Systems  All other systems reviewed and are negative.      Objective:   Physical Exam General: AAO x3, NAD  Dermatological: Skin is warm, dry and supple bilateral. Nails x 10 are well manicured; remaining integument appears unremarkable at this time. There are no open sores, no preulcerative lesions, no rash or signs of infection present.  Vascular: Dorsalis Pedis artery and Posterior Tibial artery pedal pulses are 2/4 bilateral with immedate capillary fill time. Pedal hair growth present.  There is no pain with calf compression, swelling, warmth, erythema.   Neruologic: Grossly intact via light touch bilateral. Vibratory intact via tuning fork bilateral. Protective threshold with Semmes Wienstein monofilament intact to all pedal sites bilateral.   Musculoskeletal: Tenderness to palpation along the plantar medial tubercle of the calcaneus at the insertion of plantar fascia on the left and right foot. There is no pain along the course of the plantar fascia within the arch of the foot. Plantar fascia appears to be intact. There is no pain with lateral compression of the calcaneus or pain with vibratory sensation. There is no pain along the course or insertion of the achilles tendon. No other areas of tenderness to bilateral lower extremities. Muscular strength 5/5 in all groups  tested bilateral.  Gait: Unassisted, Nonantalgic.      Assessment & Plan:  56 year old female bilateral heel pain, plantar fasciitis -Treatment options discussed including all alternatives, risks, and complications -Etiology of symptoms were discussed -X-rays were obtained and reviewed with the patient. No evidence of acute fracture identified. -Patient elects to proceed with steroid injection into the left and right heel. Under sterile skin preparation, a total of 2.5cc of kenalog 10, 0.5% Marcaine plain, and 2% lidocaine plain were infiltrated into the symptomatic area without complication. A band-aid was applied. Patient tolerated the injection well without complication. Post-injection care with discussed with the patient. Discussed with the patient to ice the area over the next couple of days to help prevent a steroid flare.  -Plantar fascial brace dispensed -Prescribed mobic. Discussed side effects of the medication and directed to stop if any are to occur and call the office.  -Stretching, icing daily -Discussed shoe gear modifications and orthotics -Follow-up in 3 weeks or sooner if any problems arise. In the meantime, encouraged to call the office with any questions, concerns, change in symptoms.   Ovid CurdMatthew Wagoner, DPM

## 2016-02-02 NOTE — Patient Instructions (Signed)

## 2016-02-03 DIAGNOSIS — M722 Plantar fascial fibromatosis: Secondary | ICD-10-CM | POA: Insufficient documentation

## 2016-02-03 MED ORDER — TRIAMCINOLONE ACETONIDE 10 MG/ML IJ SUSP
10.0000 mg | Freq: Once | INTRAMUSCULAR | Status: AC
  Administered 2016-02-03: 10 mg

## 2016-02-23 ENCOUNTER — Ambulatory Visit (INDEPENDENT_AMBULATORY_CARE_PROVIDER_SITE_OTHER): Payer: Managed Care, Other (non HMO) | Admitting: Podiatry

## 2016-02-23 ENCOUNTER — Encounter: Payer: Self-pay | Admitting: Podiatry

## 2016-02-23 VITALS — BP 114/68 | HR 69 | Resp 18

## 2016-02-23 DIAGNOSIS — M722 Plantar fascial fibromatosis: Secondary | ICD-10-CM

## 2016-02-23 MED ORDER — TRIAMCINOLONE ACETONIDE 10 MG/ML IJ SUSP
10.0000 mg | Freq: Once | INTRAMUSCULAR | Status: AC
  Administered 2016-02-23: 10 mg

## 2016-02-23 NOTE — Progress Notes (Signed)
Subjective: [name] presents to the office today for follow-up evaluation of bilateral heel pain. She states that she is doing much better but she is still having some mild pain to the heels. She has been stretching but not icing. The braces have been helping a lot.  Denies any systemic complaints such as fevers, chills, nausea, vomiting. No acute changes since last appointment, and no other complaints at this time.   Objective: AAO x3, NAD DP/PT pulses palpable bilaterally, CRT less than 3 seconds There is mild continued tenderness to palpation along the plantar medial tubercle of the calcaneus at the insertion of plantar fascia on the left and right foot. There is no pain along the course of the plantar fascia within the arch of the foot. Plantar fascia appears to be intact. There is no pain with lateral compression of the calcaneus or pain with vibratory sensation. There is no pain along the course or insertion of the achilles tendon. No other areas of tenderness to bilateral lower extremities. No open lesions or pre-ulcerative lesions.  No pain with calf compression, swelling, warmth, erythema  Assessment: Resolving bilateral heel pain- plantar fasciitis  Plan: -All treatment options discussed with the patient including all alternatives, risks, complications.  -Patient elects to proceed with steroid injection into the left and right heel. Under sterile skin preparation, a total of 2.5cc of kenalog 10, 0.5% Marcaine plain, and 2% lidocaine plain were infiltrated into the symptomatic area without complication. A band-aid was applied. Patient tolerated the injection well without complication. Post-injection care with discussed with the patient. Discussed with the patient to ice the area over the next couple of days to help prevent a steroid flare.  -Continue stretching, icing daily -Plantar fascial brace -Discussed orthotics -RTC in 4 weeks if symptoms not completely resolved or sooner if  needed.  -Patient encouraged to call the office with any questions, concerns, change in symptoms.   Melissa Roth, DPM

## 2016-06-15 ENCOUNTER — Other Ambulatory Visit: Payer: Self-pay | Admitting: Physical Medicine and Rehabilitation

## 2016-06-15 DIAGNOSIS — K7689 Other specified diseases of liver: Secondary | ICD-10-CM

## 2016-06-23 ENCOUNTER — Ambulatory Visit
Admission: RE | Admit: 2016-06-23 | Discharge: 2016-06-23 | Disposition: A | Discharge status: Home / Self Care | Payer: 59 | Source: Ambulatory Visit | Attending: Physical Medicine and Rehabilitation | Admitting: Physical Medicine and Rehabilitation

## 2016-06-23 DIAGNOSIS — K7689 Other specified diseases of liver: Secondary | ICD-10-CM

## 2016-10-14 ENCOUNTER — Emergency Department (HOSPITAL_BASED_OUTPATIENT_CLINIC_OR_DEPARTMENT_OTHER): Payer: 59

## 2016-10-14 ENCOUNTER — Emergency Department (HOSPITAL_BASED_OUTPATIENT_CLINIC_OR_DEPARTMENT_OTHER)
Admission: EM | Admit: 2016-10-14 | Discharge: 2016-10-14 | Disposition: A | Discharge status: Home / Self Care | Payer: 59 | Attending: Emergency Medicine | Admitting: Emergency Medicine

## 2016-10-14 ENCOUNTER — Encounter (HOSPITAL_BASED_OUTPATIENT_CLINIC_OR_DEPARTMENT_OTHER): Payer: Self-pay

## 2016-10-14 DIAGNOSIS — Z79899 Other long term (current) drug therapy: Secondary | ICD-10-CM | POA: Diagnosis not present

## 2016-10-14 DIAGNOSIS — R52 Pain, unspecified: Secondary | ICD-10-CM

## 2016-10-14 DIAGNOSIS — M79605 Pain in left leg: Secondary | ICD-10-CM | POA: Diagnosis present

## 2016-10-14 HISTORY — DX: Cervical disc disorder with radiculopathy, unspecified cervical region: M50.10

## 2016-10-14 HISTORY — DX: Sciatica, unspecified side: M54.30

## 2016-10-14 MED ORDER — OXYCODONE-ACETAMINOPHEN 5-325 MG PO TABS
1.0000 | ORAL_TABLET | Freq: Once | ORAL | Status: AC
  Administered 2016-10-14: 1 via ORAL
  Filled 2016-10-14: qty 1

## 2016-10-14 MED ORDER — TRAMADOL HCL 50 MG PO TABS: 50.0000 mg | ORAL_TABLET | Freq: Four times a day (QID) | ORAL | 0 refills | Status: DC | PRN

## 2016-10-14 MED ORDER — METHYLPREDNISOLONE 4 MG PO TBPK: ORAL_TABLET | ORAL | 0 refills | Status: DC

## 2016-10-14 NOTE — ED Triage Notes (Signed)
C/o pain to left LE-started yesterday-denies injury-slow limping gait

## 2016-10-14 NOTE — Discharge Instructions (Signed)
Please continue taking your NSAIDS and muscle relaxer. Have given you a steroid pack to take as directed. Please follow up with your orthopaedic doctor. If you develop and worsening symptoms return to the ED. Will give you short course of pain medicine to take tomorrow if you are not improving.

## 2016-10-15 NOTE — ED Provider Notes (Signed)
[facility] Provider Note   CSN: [id number] Arrival date & time: 10/14/16  2019     History   Chief Complaint Chief Complaint  Patient presents with  . Leg Pain    HPI Melissa Roth is a 56 y.o. female.  HPI 56 year old African-American female past medical history significant for sciatica and cervical disc radiculopathy presents to the emergency Department today with complaints of left hip pain that radiates down her left leg. Patient states that symptoms started after working out on the elliptical yesterday. States that pain was worse this morning when she got up. Pain is worse with movement and palpation. States that she did have a cold sweat this morning due to the pain and felt nauseated. Patient took ibuprofen and Flexeril at home with little relief. States that she called her orthopedic doctor but they were unable to see her today. States that she did have a history of sciatica and this feels somewhat similar however worse. Patient is able to open with a limp. Denies any other trauma. Patient denies any lower extremity paresthesias or weakness. Pt denies any ha, night sweats, hx of ivdu/cancer, loss or bowel or bladder, urinary retention, saddle paresthesias, lower extremity paresthesias, urinary symptoms.    Past Medical History:  Diagnosis Date  . Cervical disc disorder with radiculopathy of cervical region   . Sciatica     Patient Active Problem List   Diagnosis Date Noted  . Plantar fasciitis 02/03/2016  . Left shoulder pain 12/26/2012  . Low back pain 09/25/2012  . Cervical pain (neck) 09/25/2012    Past Surgical History:  Procedure Laterality Date  . ABDOMINAL HYSTERECTOMY      OB History    No data available       Home Medications    Prior to Admission medications   Medication Sig Start Date End Date Taking? Authorizing Provider  cyclobenzaprine (FLEXERIL) 10 MG tablet Take 1 tablet (10 mg total) by mouth at bedtime as needed for muscle  spasms. Patient not taking: Reported on 02/02/2016 09/25/12   [name], MD  fluconazole (DIFLUCAN) 150 MG tablet  03/09/13   [provider]  gabapentin (NEURONTIN) 300 MG capsule Take 1 capsule (300 mg total) by mouth at bedtime. Patient not taking: Reported on 02/02/2016 03/28/13   [name], DO  meloxicam (MOBIC) 15 MG tablet Take 1 tablet (15 mg total) by mouth daily. Patient not taking: Reported on 02/02/2016 02/12/13   [name], DO  meloxicam (MOBIC) 15 MG tablet Take 1 tablet (15 mg total) by mouth daily. 02/02/16 02/01/17  [name], DPM  methylPREDNISolone (MEDROL DOSEPAK) 4 MG TBPK tablet Take as directed 10/14/16   [name], PA-C  Multiple Vitamin (MULTIVITAMIN) capsule Take 1 capsule by mouth daily.    [provider]  traMADol (ULTRAM) 50 MG tablet Take 1 tablet (50 mg total) by mouth every 6 (six) hours as needed. 10/14/16   [name], PA-C  valACYclovir (VALTREX) 500 MG tablet  11/03/12   [provider]    Family History Family History  Problem Relation Age of Onset  . Hyperlipidemia Mother   . Hypertension Mother   . Hypertension Father   . Hyperlipidemia Father   . Heart attack Father   . Hyperlipidemia Brother   . Hypertension Brother   . Diabetes Neg Hx   . Sudden death Neg Hx     Social History Social History  Substance Use Topics  .  Smoking status: Never Smoker  . Smokeless tobacco: Never Used  . Alcohol use Yes     Comment: occ     Allergies   Ivp dye [iodinated diagnostic agents] and Sulfa antibiotics   Review of Systems Review of Systems  Constitutional: Negative for chills and fever.  Musculoskeletal: Positive for arthralgias, back pain, gait problem and myalgias. Negative for neck pain and neck stiffness.  Skin: Negative for color change.  Neurological: Negative for weakness and numbness.     Physical Exam Updated Vital Signs BP 129/72 (BP Location: Left Arm)    Pulse 80   Temp 98.2 F (36.8 C) (Oral)   Resp 18   Ht  (1.626 m)   Wt 79.8 kg (175 lb 14.8 oz)   SpO2 100%   BMI 30.20 kg/m   Physical Exam  Constitutional: She appears well-developed and well-nourished. No distress.  HENT:  Head: Normocephalic and atraumatic.  Eyes: Right eye exhibits no discharge. Left eye exhibits no discharge. No scleral icterus.  Neck: Normal range of motion.  Pulmonary/Chest: No respiratory distress.  Musculoskeletal: Normal range of motion.  Patient with pain to palpation of the left buttocks that radiates to the left posterior thigh. Full range of motion of all joints. No erythema, edema, warmth over the joints.  No midline T spine or L spine tenderness. No deformities or step offs noted. Full ROM. Pelvis is stable.  No lower extremity edema or calf tenderness.   Neurological: She is alert.  Strength 5 out of 5 in lower extremities. Sensation intact to all dermatomes. Patellar reflexes are normal.  Skin: Skin is warm and dry. Capillary refill takes less than 2 seconds. No pallor.  Psychiatric: Her behavior is normal. Judgment and thought content normal.  Nursing note and vitals reviewed.    ED Treatments / Results  Labs (all labs ordered are listed, but only abnormal results are displayed) Labs Reviewed - No data to display  EKG  EKG Interpretation None       Radiology US Venous Img Lower Unilateral Left  Result Date: 10/14/2016 CLINICAL DATA:  56 year old female with posterior left thigh pain for 1 day with no known injury. EXAM: LEFT LOWER EXTREMITY VENOUS DOPPLER ULTRASOUND TECHNIQUE: Gray-scale sonography with graded compression, as well as color Doppler and duplex ultrasound were performed to evaluate the lower extremity deep venous systems from the level of the common femoral vein and including the common femoral, femoral, profunda femoral, popliteal and calf veins including the posterior tibial, peroneal and gastrocnemius veins  when visible. The superficial great saphenous vein was also interrogated. Spectral Doppler was utilized to evaluate flow at rest and with distal augmentation maneuvers in the common femoral, femoral and popliteal veins. COMPARISON:  None. FINDINGS: Contralateral Common Femoral Vein: Respiratory phasicity is normal and symmetric with the symptomatic side. No evidence of thrombus. Normal compressibility. Common Femoral Vein: No evidence of thrombus. Normal compressibility, respiratory phasicity and response to augmentation. Saphenofemoral Junction: No evidence of thrombus. Normal compressibility and flow on color Doppler imaging. Profunda Femoral Vein: No evidence of thrombus. Normal compressibility and flow on color Doppler imaging. Femoral Vein: No evidence of thrombus. Normal compressibility, respiratory phasicity and response to augmentation. Popliteal Vein: No evidence of thrombus. Normal compressibility, respiratory phasicity and response to augmentation. Calf Veins: No evidence of thrombus. Normal compressibility and flow on color Doppler imaging. Superficial Great Saphenous Vein: No evidence of thrombus. Normal compressibility. Venous Reflux:  None. Other Findings: Spectral broadening on Doppler of both common femoral  veins, significance unclear. IMPRESSION: No evidence of left lower extremity deep venous thrombosis. Electronically Signed   By: Odessa Fleming M.D.   On: 10/14/2016 21:48    Procedures Procedures (including critical care time)  Medications Ordered in ED Medications  oxyCODONE-acetaminophen (PERCOCET/ROXICET) 5-325 MG per tablet 1 tablet (1 tablet Oral Given 10/14/16 2234)     Initial Impression / Assessment and Plan / ED Course  I have reviewed the triage vital signs and the nursing notes.  Pertinent labs & imaging results that were available during my care of the patient were reviewed by me and considered in my medical decision making (see chart for details).     Patient presents to  the ED with complaints of left hip pain. History of sciatic nerve pain. Patient is neurovascularly intact in all extremities. No focal neuro deficit. Patient denies any back pain. No red flag symptoms concerning for cauda equina. Patient's pain seems typical of a radicular pain. Encouraged her to continue the ibuprofen and Flexeril.  Ultrasound was obtained to rule out blood clot. This was unremarkable. Do not feel that any further imaging is indicated at this time. No signs of septic joint. We'll give patient pain medicine the ED. Encouraged symptomatic treatment at home. We'll give Medrol Dosepak. Close follow-up with her orthopedic doctor.  Pt is hemodynamically stable, in NAD, & able to ambulate in the ED. Evaluation does not show pathology that would require ongoing emergent intervention or inpatient treatment. I explained the diagnosis to the patient. Pain has been managed & has no complaints prior to dc. Pt is comfortable with above plan and is stable for discharge at this time. All questions were answered prior to disposition. Strict return precautions for f/u to the ED were discussed. Encouraged follow up with PCP.   Final Clinical Impressions(s) / ED Diagnoses   Final diagnoses:  Pain  Left leg pain    New Prescriptions Discharge Medication List as of 10/14/2016 10:32 PM    START taking these medications   Details  methylPREDNISolone (MEDROL DOSEPAK) 4 MG TBPK tablet Take as directed, Print    traMADol (ULTRAM) 50 MG tablet Take 1 tablet (50 mg total) by mouth every 6 (six) hours as needed., Starting Thu 10/14/2016, Print         [name], PA-C 10/15/16 0214    Vanetta Mulders, MD 10/15/16 2337

## 2016-10-18 ENCOUNTER — Other Ambulatory Visit: Payer: Self-pay | Admitting: Family Medicine

## 2016-10-18 DIAGNOSIS — Z0000 Encounter for general adult medical examination without abnormal findings: Secondary | ICD-10-CM

## 2016-11-02 NOTE — Pre-Procedure Instructions (Signed)
Jacky Kindlennie L Annunziato  11/02/2016      [id number] [place] - [address] [address] [address] [place] [id number] Phone: [phone] Fax: [phone]    Your procedure is scheduled on November 7  [facility] at [phone] A.M.  Call this number if you have problems the morning of surgery:  [phone]   Remember:  Do not eat food or drink liquids after midnight.  Continue all other medications as directed by your physician except follow these medication instructions before surgery   Take these medicines the morning of surgery with A SIP OF WATER  pregabalin (LYRICA traMADol (ULTRAM if needed ondansetron [facility]ZOFRAN) if needed  7 days prior to surgery STOP taking any Aspirin (unless otherwise instructed by your surgeon), Aleve, Naproxen, Ibuprofen, Motrin, Advil, Goody's, BC's, all herbal medications, fish oil, and all vitamins    Do not wear jewelry, make-up or nail polish.  Do not wear lotions, powders, or perfumes, or deoderant.  Do not shave 48 hours prior to surgery.  Men may shave face and neck.  Do not bring valuables to the hospital.  [facility] is not responsible for any belongings or valuables.  Contacts, dentures or bridgework may not be worn into surgery.  Leave your suitcase in the car.  After surgery it may be brought to your room.  For patients admitted to the hospital, discharge time will be determined by your treatment team.  Patients discharged the day of surgery will not be allowed to drive home.    Special instructions:   [facility]- Preparing For Surgery  Before surgery, you can play an important role. Because skin is not sterile, your skin needs to be as free of germs as possible. You can reduce the number of germs on your skin by washing with CHG (chlorahexidine gluconate) Soap before surgery.  CHG is an antiseptic cleaner which kills germs and bonds with the  skin to continue killing germs even after washing.  Please do not use if you have an allergy to CHG or antibacterial soaps. If your skin becomes reddened/irritated stop using the CHG.  Do not shave (including legs and underarms) for at least 48 hours prior to first CHG shower. It is OK to shave your face.  Please follow these instructions carefully.   1. Shower the NIGHT BEFORE SURGERY and the MORNING OF SURGERY with CHG.   2. If you chose to wash your hair, wash your hair first as usual with your normal shampoo.  3. After you shampoo, rinse your hair and body thoroughly to remove the shampoo.  4. Use CHG as you would any other liquid soap. You can apply CHG directly to the skin and wash gently with a scrungie or a clean washcloth.   5. Apply the CHG Soap to your body ONLY FROM THE NECK DOWN.  Do not use on open wounds or open sores. Avoid contact with your eyes, ears, mouth and genitals (private parts). Wash Face and genitals (private parts)  with your normal soap.  6. Wash thoroughly, paying special attention to the area where your surgery will be performed.  7. Thoroughly rinse your body with warm water from the neck down.  8. DO NOT shower/wash with your normal soap after using and rinsing off the CHG Soap.  9. Pat yourself dry with a CLEAN TOWEL.  10. Wear CLEAN PAJAMAS to bed the night before surgery, wear  comfortable clothes the morning of surgery  11. Place CLEAN SHEETS on your bed the night of your first shower and DO NOT SLEEP WITH PETS.    Day of Surgery: Do not apply any deodorants/lotions. Please wear clean clothes to the hospital/surgery center.      Please read over the following fact sheets that you were given.

## 2016-11-03 ENCOUNTER — Encounter (HOSPITAL_COMMUNITY): Payer: Self-pay

## 2016-11-03 ENCOUNTER — Encounter (HOSPITAL_COMMUNITY)
Admission: RE | Admit: 2016-11-03 | Discharge: 2016-11-03 | Disposition: A | Discharge status: Home / Self Care | Payer: 59 | Source: Ambulatory Visit | Attending: Orthopedic Surgery | Admitting: Orthopedic Surgery

## 2016-11-03 DIAGNOSIS — M4807 Spinal stenosis, lumbosacral region: Secondary | ICD-10-CM | POA: Insufficient documentation

## 2016-11-03 DIAGNOSIS — M5127 Other intervertebral disc displacement, lumbosacral region: Secondary | ICD-10-CM | POA: Insufficient documentation

## 2016-11-03 DIAGNOSIS — G588 Other specified mononeuropathies: Secondary | ICD-10-CM | POA: Diagnosis not present

## 2016-11-03 HISTORY — DX: Other specified postprocedural states: Z98.890

## 2016-11-03 HISTORY — DX: Other specified postprocedural states: R11.2

## 2016-11-03 LAB — BASIC METABOLIC PANEL
ANION GAP: 7 (ref 5–15)
BUN: 8 mg/dL (ref 6–20)
CO2: 27 mmol/L (ref 22–32)
Calcium: 9.1 mg/dL (ref 8.9–10.3)
Chloride: 108 mmol/L (ref 101–111)
Creatinine, Ser: 0.77 mg/dL (ref 0.44–1.00)
GFR calc Af Amer: >60 mL/min (ref >60)
Glucose, Bld: 99 mg/dL (ref 65–99)
POTASSIUM: 4.3 mmol/L (ref 3.5–5.1)
SODIUM: 142 mmol/L (ref 135–145)

## 2016-11-03 LAB — CBC
HEMATOCRIT: 40 % (ref 36.0–46.0)
HEMOGLOBIN: 13.2 g/dL (ref 12.0–15.0)
MCH: 30.6 pg (ref 26.0–34.0)
MCHC: 33 g/dL (ref 30.0–36.0)
MCV: 92.6 fL (ref 78.0–100.0)
Platelets: 256 10*3/uL (ref 150–400)
RBC: 4.32 MIL/uL (ref 3.87–5.11)
RDW: 13.3 % (ref 11.5–15.5)
WBC: 4.4 10*3/uL (ref 4.0–10.5)

## 2016-11-03 LAB — SURGICAL PCR SCREEN
MRSA, PCR: NEGATIVE
STAPHYLOCOCCUS AUREUS: NEGATIVE

## 2016-11-03 NOTE — Progress Notes (Signed)
PCP - Fransisca KaufmannElizabeth Barns Cardiologist -  denies  Chest x-ray - not needed EKG - requesting Stress Test - denies ECHO - denies Cardiac Cath - denies  Sending to anesthesia for review of records requested  Patient denies shortness of breath, fever, cough and chest pain at PAT appointment   Patient verbalized understanding of instructions that were given to them at the PAT appointment. Patient was also instructed that they will need to review over the PAT instructions again at home before surgery.

## 2016-11-04 NOTE — Progress Notes (Addendum)
Anesthesia Chart Review:  Pt is a 56 year old female scheduled for L5-S1 discectomy on 11/10/2016 with Venita Lickahari Brooks, MD  - PCP is Juluis RainierElizabeth Barnes, MD  PMH reviewed. Never smoker. BMI 29  Medications reviewed.   BP 119/69   Pulse 98   Temp 36.8 C   Resp 18   Ht 5\' 4"  (1.626 m)   Wt 170 lb 10.2 oz (77.4 kg)   SpO2 98%   BMI 29.29 kg/m   Preoperative labs reviewed.    EKG 10/28/16 (PCP's office): sinus rhythm   If no changes, I anticipate pt can proceed with surgery as scheduled.   Rica Mastngela Hanna Ra, FNP-BC [facility] Phone: [phone] 11/04/2016 1:33 PM

## 2016-11-10 ENCOUNTER — Encounter (HOSPITAL_COMMUNITY)
Admission: RE | Disposition: A | Discharge status: Home / Self Care | Payer: Self-pay | Source: Ambulatory Visit | Attending: Orthopedic Surgery

## 2016-11-10 ENCOUNTER — Ambulatory Visit (HOSPITAL_COMMUNITY): Payer: 59 | Admitting: Anesthesiology

## 2016-11-10 ENCOUNTER — Observation Stay (HOSPITAL_COMMUNITY)
Admission: RE | Admit: 2016-11-10 | Discharge: 2016-11-11 | Disposition: A | Discharge status: Home / Self Care | Payer: 59 | Source: Ambulatory Visit | Attending: Orthopedic Surgery | Admitting: Orthopedic Surgery

## 2016-11-10 ENCOUNTER — Other Ambulatory Visit: Payer: Self-pay

## 2016-11-10 ENCOUNTER — Ambulatory Visit (HOSPITAL_COMMUNITY): Payer: 59 | Admitting: Emergency Medicine

## 2016-11-10 ENCOUNTER — Ambulatory Visit (HOSPITAL_COMMUNITY): Payer: 59

## 2016-11-10 ENCOUNTER — Encounter (HOSPITAL_COMMUNITY): Payer: Self-pay | Admitting: *Deleted

## 2016-11-10 DIAGNOSIS — Z8249 Family history of ischemic heart disease and other diseases of the circulatory system: Secondary | ICD-10-CM | POA: Insufficient documentation

## 2016-11-10 DIAGNOSIS — Z419 Encounter for procedure for purposes other than remedying health state, unspecified: Secondary | ICD-10-CM

## 2016-11-10 DIAGNOSIS — Z882 Allergy status to sulfonamides status: Secondary | ICD-10-CM | POA: Insufficient documentation

## 2016-11-10 DIAGNOSIS — Z9889 Other specified postprocedural states: Secondary | ICD-10-CM

## 2016-11-10 DIAGNOSIS — M5117 Intervertebral disc disorders with radiculopathy, lumbosacral region: Principal | ICD-10-CM | POA: Insufficient documentation

## 2016-11-10 DIAGNOSIS — Z91041 Radiographic dye allergy status: Secondary | ICD-10-CM | POA: Insufficient documentation

## 2016-11-10 DIAGNOSIS — M50322 Other cervical disc degeneration at C5-C6 level: Secondary | ICD-10-CM | POA: Insufficient documentation

## 2016-11-10 DIAGNOSIS — Z79899 Other long term (current) drug therapy: Secondary | ICD-10-CM | POA: Insufficient documentation

## 2016-11-10 HISTORY — PX: LUMBAR LAMINECTOMY/DECOMPRESSION MICRODISCECTOMY: SHX5026

## 2016-11-10 SURGERY — LUMBAR LAMINECTOMY/DECOMPRESSION MICRODISCECTOMY 1 LEVEL
Anesthesia: General | Laterality: Left

## 2016-11-10 MED ORDER — MAGNESIUM CITRATE PO SOLN: 1.0000 | Freq: Once | ORAL | Status: DC | PRN

## 2016-11-10 MED ORDER — BUPIVACAINE-EPINEPHRINE 0.25% -1:200000 IJ SOLN
INTRAMUSCULAR | Status: DC | PRN
  Administered 2016-11-10: 10 mL

## 2016-11-10 MED ORDER — METHOCARBAMOL 1000 MG/10ML IJ SOLN
500.0000 mg | Freq: Four times a day (QID) | INTRAVENOUS | Status: DC | PRN
  Filled 2016-11-10: qty 5

## 2016-11-10 MED ORDER — SODIUM CHLORIDE 0.9% FLUSH: 3.0000 mL | INTRAVENOUS | Status: DC | PRN

## 2016-11-10 MED ORDER — SODIUM CHLORIDE 0.9% FLUSH: 3.0000 mL | Freq: Two times a day (BID) | INTRAVENOUS | Status: DC

## 2016-11-10 MED ORDER — ONDANSETRON HCL 4 MG/2ML IJ SOLN
INTRAMUSCULAR | Status: AC
  Filled 2016-11-10: qty 2

## 2016-11-10 MED ORDER — LIDOCAINE 2% (20 MG/ML) 5 ML SYRINGE
INTRAMUSCULAR | Status: AC
  Filled 2016-11-10: qty 5

## 2016-11-10 MED ORDER — OXYCODONE HCL 5 MG PO TABS
10.0000 mg | ORAL_TABLET | ORAL | Status: DC | PRN
  Administered 2016-11-10 – 2016-11-11 (×6): 10 mg via ORAL
  Filled 2016-11-10 (×5): qty 2

## 2016-11-10 MED ORDER — ONDANSETRON HCL 4 MG/2ML IJ SOLN
4.0000 mg | Freq: Four times a day (QID) | INTRAMUSCULAR | Status: DC | PRN
  Administered 2016-11-10: 4 mg via INTRAVENOUS
  Filled 2016-11-10: qty 2

## 2016-11-10 MED ORDER — ONDANSETRON HCL 4 MG/2ML IJ SOLN
INTRAMUSCULAR | Status: DC | PRN
  Administered 2016-11-10: 4 mg via INTRAVENOUS

## 2016-11-10 MED ORDER — PROPOFOL 10 MG/ML IV BOLUS
INTRAVENOUS | Status: AC
  Filled 2016-11-10: qty 20

## 2016-11-10 MED ORDER — THROMBIN (RECOMBINANT) 20000 UNITS EX SOLR
CUTANEOUS | Status: DC | PRN
  Administered 2016-11-10: 20000 [IU] via TOPICAL

## 2016-11-10 MED ORDER — ONDANSETRON HCL 4 MG PO TABS: 4.0000 mg | ORAL_TABLET | Freq: Four times a day (QID) | ORAL | Status: DC | PRN

## 2016-11-10 MED ORDER — DEXAMETHASONE SODIUM PHOSPHATE 10 MG/ML IJ SOLN
INTRAMUSCULAR | Status: AC
  Filled 2016-11-10: qty 1

## 2016-11-10 MED ORDER — PREGABALIN 75 MG PO CAPS
75.0000 mg | ORAL_CAPSULE | Freq: Two times a day (BID) | ORAL | Status: DC
  Administered 2016-11-10 – 2016-11-11 (×2): 75 mg via ORAL
  Filled 2016-11-10 (×2): qty 1

## 2016-11-10 MED ORDER — CEFAZOLIN SODIUM-DEXTROSE 2-4 GM/100ML-% IV SOLN
2.0000 g | Freq: Three times a day (TID) | INTRAVENOUS | Status: AC
  Administered 2016-11-10 – 2016-11-11 (×2): 2 g via INTRAVENOUS
  Filled 2016-11-10 (×2): qty 100

## 2016-11-10 MED ORDER — ACETAMINOPHEN 10 MG/ML IV SOLN
1000.0000 mg | Freq: Once | INTRAVENOUS | Status: AC
  Administered 2016-11-10: 1000 mg via INTRAVENOUS
  Filled 2016-11-10: qty 100

## 2016-11-10 MED ORDER — PROMETHAZINE HCL 25 MG/ML IJ SOLN: 6.2500 mg | INTRAMUSCULAR | Status: DC | PRN

## 2016-11-10 MED ORDER — LACTATED RINGERS IV SOLN
INTRAVENOUS | Status: DC
  Administered 2016-11-10 (×2): via INTRAVENOUS

## 2016-11-10 MED ORDER — FENTANYL CITRATE (PF) 100 MCG/2ML IJ SOLN
INTRAMUSCULAR | Status: DC | PRN
  Administered 2016-11-10: 50 ug via INTRAVENOUS
  Administered 2016-11-10: 150 ug via INTRAVENOUS
  Administered 2016-11-10: 50 ug via INTRAVENOUS

## 2016-11-10 MED ORDER — CEFAZOLIN SODIUM-DEXTROSE 2-4 GM/100ML-% IV SOLN
2.0000 g | INTRAVENOUS | Status: AC
  Administered 2016-11-10: 2 g via INTRAVENOUS

## 2016-11-10 MED ORDER — FENTANYL CITRATE (PF) 100 MCG/2ML IJ SOLN
INTRAMUSCULAR | Status: AC
  Administered 2016-11-10: 50 ug via INTRAVENOUS
  Filled 2016-11-10: qty 2

## 2016-11-10 MED ORDER — OXYCODONE HCL 5 MG PO TABS: 5.0000 mg | ORAL_TABLET | ORAL | Status: DC | PRN

## 2016-11-10 MED ORDER — ROCURONIUM BROMIDE 10 MG/ML (PF) SYRINGE
PREFILLED_SYRINGE | INTRAVENOUS | Status: AC
  Filled 2016-11-10: qty 5

## 2016-11-10 MED ORDER — MENTHOL 3 MG MT LOZG: 1.0000 | LOZENGE | OROMUCOSAL | Status: DC | PRN

## 2016-11-10 MED ORDER — METHOCARBAMOL 500 MG PO TABS
ORAL_TABLET | ORAL | Status: AC
  Filled 2016-11-10: qty 1

## 2016-11-10 MED ORDER — BACLOFEN 10 MG PO TABS: 10.0000 mg | ORAL_TABLET | Freq: Three times a day (TID) | ORAL | 1 refills | Status: AC

## 2016-11-10 MED ORDER — FAMOTIDINE IN NACL 20-0.9 MG/50ML-% IV SOLN: 20.0000 mg | Freq: Once | INTRAVENOUS | Status: DC

## 2016-11-10 MED ORDER — METHYLPREDNISOLONE ACETATE 40 MG/ML IJ SUSP
INTRAMUSCULAR | Status: AC
  Filled 2016-11-10: qty 1

## 2016-11-10 MED ORDER — METHOCARBAMOL 500 MG PO TABS
500.0000 mg | ORAL_TABLET | Freq: Four times a day (QID) | ORAL | Status: DC | PRN
  Administered 2016-11-10 – 2016-11-11 (×3): 500 mg via ORAL
  Filled 2016-11-10 (×2): qty 1

## 2016-11-10 MED ORDER — BUPIVACAINE-EPINEPHRINE (PF) 0.25% -1:200000 IJ SOLN
INTRAMUSCULAR | Status: AC
  Filled 2016-11-10: qty 30

## 2016-11-10 MED ORDER — ACETAMINOPHEN 650 MG RE SUPP: 650.0000 mg | RECTAL | Status: DC | PRN

## 2016-11-10 MED ORDER — PHENOL 1.4 % MT LIQD: 1.0000 | OROMUCOSAL | Status: DC | PRN

## 2016-11-10 MED ORDER — POLYETHYLENE GLYCOL 3350 17 G PO PACK: 17.0000 g | PACK | Freq: Every day | ORAL | Status: DC | PRN

## 2016-11-10 MED ORDER — PHENYLEPHRINE HCL 10 MG/ML IJ SOLN
INTRAMUSCULAR | Status: DC | PRN
  Administered 2016-11-10 (×6): 80 ug via INTRAVENOUS

## 2016-11-10 MED ORDER — METHYLPREDNISOLONE ACETATE 40 MG/ML IJ SUSP
INTRAMUSCULAR | Status: DC | PRN
  Administered 2016-11-10: 20 mg via INTRAMUSCULAR

## 2016-11-10 MED ORDER — LACTATED RINGERS IV SOLN
INTRAVENOUS | Status: DC
  Administered 2016-11-10: 16:00:00 via INTRAVENOUS

## 2016-11-10 MED ORDER — ROCURONIUM BROMIDE 100 MG/10ML IV SOLN
INTRAVENOUS | Status: DC | PRN
  Administered 2016-11-10: 50 mg via INTRAVENOUS

## 2016-11-10 MED ORDER — OXYCODONE-ACETAMINOPHEN 10-325 MG PO TABS: 1.0000 | ORAL_TABLET | ORAL | 0 refills | Status: AC | PRN

## 2016-11-10 MED ORDER — MIDAZOLAM HCL 5 MG/5ML IJ SOLN
INTRAMUSCULAR | Status: DC | PRN
  Administered 2016-11-10: 2 mg via INTRAVENOUS

## 2016-11-10 MED ORDER — CEFAZOLIN SODIUM-DEXTROSE 2-4 GM/100ML-% IV SOLN
INTRAVENOUS | Status: AC
  Filled 2016-11-10: qty 100

## 2016-11-10 MED ORDER — 0.9 % SODIUM CHLORIDE (POUR BTL) OPTIME
TOPICAL | Status: DC | PRN
  Administered 2016-11-10: 1000 mL

## 2016-11-10 MED ORDER — DEXAMETHASONE SODIUM PHOSPHATE 10 MG/ML IJ SOLN
INTRAMUSCULAR | Status: DC | PRN
  Administered 2016-11-10: 10 mg via INTRAVENOUS

## 2016-11-10 MED ORDER — PROPOFOL 10 MG/ML IV BOLUS
INTRAVENOUS | Status: DC | PRN
  Administered 2016-11-10: 150 mg via INTRAVENOUS

## 2016-11-10 MED ORDER — FENTANYL CITRATE (PF) 100 MCG/2ML IJ SOLN
25.0000 ug | INTRAMUSCULAR | Status: DC | PRN
  Administered 2016-11-10 (×2): 50 ug via INTRAVENOUS

## 2016-11-10 MED ORDER — DOCUSATE SODIUM 100 MG PO CAPS
100.0000 mg | ORAL_CAPSULE | Freq: Two times a day (BID) | ORAL | Status: DC
  Administered 2016-11-10 – 2016-11-11 (×2): 100 mg via ORAL
  Filled 2016-11-10 (×2): qty 1

## 2016-11-10 MED ORDER — LIDOCAINE HCL (CARDIAC) 20 MG/ML IV SOLN
INTRAVENOUS | Status: DC | PRN
  Administered 2016-11-10: 60 mg via INTRAVENOUS

## 2016-11-10 MED ORDER — OXYCODONE HCL 5 MG PO TABS
ORAL_TABLET | ORAL | Status: AC
Start: 2016-11-10 — End: 2016-11-11
  Filled 2016-11-10: qty 2

## 2016-11-10 MED ORDER — MIDAZOLAM HCL 2 MG/2ML IJ SOLN
INTRAMUSCULAR | Status: AC
  Filled 2016-11-10: qty 2

## 2016-11-10 MED ORDER — SUGAMMADEX SODIUM 200 MG/2ML IV SOLN
INTRAVENOUS | Status: AC
  Filled 2016-11-10: qty 2

## 2016-11-10 MED ORDER — HEMOSTATIC AGENTS (NO CHARGE) OPTIME
TOPICAL | Status: DC | PRN
  Administered 2016-11-10: 1 via TOPICAL

## 2016-11-10 MED ORDER — THROMBIN 20000 UNITS EX KIT
PACK | CUTANEOUS | Status: AC
  Filled 2016-11-10: qty 1

## 2016-11-10 MED ORDER — SUGAMMADEX SODIUM 200 MG/2ML IV SOLN
INTRAVENOUS | Status: DC | PRN
  Administered 2016-11-10: 175 mg via INTRAVENOUS

## 2016-11-10 MED ORDER — ACETAMINOPHEN 325 MG PO TABS: 650.0000 mg | ORAL_TABLET | ORAL | Status: DC | PRN

## 2016-11-10 MED ORDER — FENTANYL CITRATE (PF) 250 MCG/5ML IJ SOLN
INTRAMUSCULAR | Status: AC
Start: 2016-11-10 — End: 2016-11-10
  Filled 2016-11-10: qty 5

## 2016-11-10 SURGICAL SUPPLY — 65 items
BUR EGG ELITE 4.0 (BURR) IMPLANT
BUR EGG ELITE 4.0MM (BURR)
BUR MATCHSTICK NEURO 3.0 LAGG (BURR) IMPLANT
CANISTER SUCT 3000ML PPV (MISCELLANEOUS) ×3 IMPLANT
CLOSURE STERI-STRIP 1/2X4 (GAUZE/BANDAGES/DRESSINGS) ×1
CLSR STERI-STRIP ANTIMIC 1/2X4 (GAUZE/BANDAGES/DRESSINGS) ×2 IMPLANT
CORD BI POLAR (MISCELLANEOUS) ×3 IMPLANT
COVER SURGICAL LIGHT HANDLE (MISCELLANEOUS) ×3 IMPLANT
DRAIN CHANNEL 15F RND FF W/TCR (WOUND CARE) IMPLANT
DRAPE POUCH INSTRU U-SHP 10X18 (DRAPES) ×3 IMPLANT
DRAPE SURG 17X23 STRL (DRAPES) ×3 IMPLANT
DRAPE U-SHAPE 47X51 STRL (DRAPES) ×3 IMPLANT
DRSG AQUACEL AG ADV 3.5X 4 (GAUZE/BANDAGES/DRESSINGS) IMPLANT
DRSG AQUACEL AG ADV 3.5X 6 (GAUZE/BANDAGES/DRESSINGS) ×3 IMPLANT
DRSG OPSITE POSTOP 4X6 (GAUZE/BANDAGES/DRESSINGS) ×3 IMPLANT
DURAPREP 26ML APPLICATOR (WOUND CARE) ×3 IMPLANT
ELECT BLADE 4.0 EZ CLEAN MEGAD (MISCELLANEOUS)
ELECT CAUTERY BLADE 6.4 (BLADE) ×3 IMPLANT
ELECT PENCIL ROCKER SW 15FT (MISCELLANEOUS) ×3 IMPLANT
ELECT REM PT RETURN 9FT ADLT (ELECTROSURGICAL) ×3
ELECTRODE BLDE 4.0 EZ CLN MEGD (MISCELLANEOUS) IMPLANT
ELECTRODE REM PT RTRN 9FT ADLT (ELECTROSURGICAL) ×1 IMPLANT
EVACUATOR SILICONE 100CC (DRAIN) IMPLANT
GLOVE BIO SURGEON STRL SZ 6.5 (GLOVE) ×2 IMPLANT
GLOVE BIO SURGEONS STRL SZ 6.5 (GLOVE) ×1
GLOVE BIOGEL PI IND STRL 6.5 (GLOVE) ×1 IMPLANT
GLOVE BIOGEL PI IND STRL 8.5 (GLOVE) ×2 IMPLANT
GLOVE BIOGEL PI INDICATOR 6.5 (GLOVE) ×2
GLOVE BIOGEL PI INDICATOR 8.5 (GLOVE) ×4
GLOVE SS BIOGEL STRL SZ 8.5 (GLOVE) ×1 IMPLANT
GLOVE SUPERSENSE BIOGEL SZ 8.5 (GLOVE) ×2
GOWN STRL REUS W/TWL 2XL LVL3 (GOWN DISPOSABLE) ×6 IMPLANT
KIT BASIN OR (CUSTOM PROCEDURE TRAY) ×3 IMPLANT
KIT ROOM TURNOVER OR (KITS) ×3 IMPLANT
NEEDLE 22X1 1/2 (OR ONLY) (NEEDLE) ×6 IMPLANT
NEEDLE SPNL 18GX3.5 QUINCKE PK (NEEDLE) ×6 IMPLANT
NS IRRIG 1000ML POUR BTL (IV SOLUTION) ×3 IMPLANT
PACK LAMINECTOMY ORTHO (CUSTOM PROCEDURE TRAY) ×3 IMPLANT
PACK UNIVERSAL I (CUSTOM PROCEDURE TRAY) ×3 IMPLANT
PAD ARMBOARD 7.5X6 YLW CONV (MISCELLANEOUS) ×6 IMPLANT
PATTIES SURGICAL .5 X.5 (GAUZE/BANDAGES/DRESSINGS) IMPLANT
PATTIES SURGICAL .5 X1 (DISPOSABLE) ×3 IMPLANT
SPONGE SURGIFOAM ABS GEL 100 (HEMOSTASIS) IMPLANT
SURGIFLO W/THROMBIN 8M KIT (HEMOSTASIS) IMPLANT
SUT BONE WAX W31G (SUTURE) ×3 IMPLANT
SUT MON AB 3-0 SH 27 (SUTURE)
SUT MON AB 3-0 SH27 (SUTURE) IMPLANT
SUT STRATAFIX 1PDS 45CM VIOLET (SUTURE) IMPLANT
SUT STRATAFIX MNCRL+ 3-0 PS-2 (SUTURE)
SUT STRATAFIX MONOCRYL 3-0 (SUTURE)
SUT STRATAFIX SPIRAL + 2-0 (SUTURE) IMPLANT
SUT VIC AB 0 CT1 27 (SUTURE)
SUT VIC AB 0 CT1 27XBRD ANBCTR (SUTURE) IMPLANT
SUT VIC AB 1 CT1 18XCR BRD 8 (SUTURE) IMPLANT
SUT VIC AB 1 CT1 8-18 (SUTURE)
SUT VIC AB 1 CTX 36 (SUTURE)
SUT VIC AB 1 CTX36XBRD ANBCTR (SUTURE) IMPLANT
SUT VIC AB 2-0 CT1 18 (SUTURE) IMPLANT
SUTURE STRATFX MNCRL+ 3-0 PS-2 (SUTURE) IMPLANT
SYR BULB IRRIGATION 50ML (SYRINGE) ×3 IMPLANT
SYR CONTROL 10ML LL (SYRINGE) ×3 IMPLANT
TOWEL OR 17X24 6PK STRL BLUE (TOWEL DISPOSABLE) ×3 IMPLANT
TOWEL OR 17X26 10 PK STRL BLUE (TOWEL DISPOSABLE) ×3 IMPLANT
WATER STERILE IRR 1000ML POUR (IV SOLUTION) ×3 IMPLANT
YANKAUER SUCT BULB TIP NO VENT (SUCTIONS) ×3 IMPLANT

## 2016-11-10 NOTE — Anesthesia Procedure Notes (Signed)
Procedure Name: Intubation Date/Time: 11/10/2016 12:21 PM Performed by: Lovie Cholock, Jasan Doughtie K, CRNA Pre-anesthesia Checklist: Patient identified, Emergency Drugs available, Suction available and Patient being monitored Patient Re-evaluated:Patient Re-evaluated prior to induction Oxygen Delivery Method: Circle System Utilized Preoxygenation: Pre-oxygenation with 100% oxygen Induction Type: IV induction Ventilation: Mask ventilation without difficulty Laryngoscope Size: Miller and 2 Grade View: Grade II Tube type: Oral Tube size: 7.0 mm Number of attempts: 1 Airway Equipment and Method: Stylet and Oral airway Placement Confirmation: ETT inserted through vocal cords under direct vision,  positive ETCO2 and breath sounds checked- equal and bilateral Secured at: 21 cm Tube secured with: Tape Dental Injury: Teeth and Oropharynx as per pre-operative assessment

## 2016-11-10 NOTE — Op Note (Signed)
Operative report.  Preoperative diagnosis.  L5-S1 disc herniation left side.  Left S1 radiculopathy.  Postoperative diagnosis.  Same.  Operative procedure.  L5-S1 microdiscectomy left side.  CPT code.  [id number].  Complications.  None.  First Geophysicist/field seismologistassistant.  [facility].  Indications this is a very pleasant 56 year old woman who has been complaining of acute onset of severe radicular left leg pain.  Imaging studies demonstrated posterior lateral to the left disc herniation/protrusion at L5-S1.  Patient had a injection which helped but only temporarily.  She noted progressive pain dysesthesias and even trace weakness.  As a result we elected to proceed with surgery.  All appropriate risks benefits and alternatives were discussed with the patient and consent was obtained obtained.  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  Imaging studies from 2015 indicate that the disc of interest was labeled as L4-5.  However MRI that was obtained in my office indicated that the disc of interest was L5-S1.  Should be noted that the L4-5 level corresponds to what I consider the L5-S1 level based on the MRI and plain x-rays obtained in my office.  Operative report.  She was brought the operating room placed upon the operating room table.  After successful induction of general anesthesia treatment the patient teds SCDs were applied she was turned prone onto the Wilson frame.  All bony prominences well-padded and the back was prepped and draped in a standard fashion.  Timeout was taken to confirm patient procedure and all other important data.  Once this was completed 2 needles were placed in the back and x-ray was taken.  I marked out the incision site based on the preoperative MRI that I obtained in my office.  The patient was noted on that MRI to have an S1-S2 space.  Therefore I consider the last open disc space S1-S2 and therefore the second to last L5-S1.  According to the radiologist who read  the x-ray based on the 2015 MRI that disc space was considered to be L4-5.  I directly compared both the 2015 and 2018 MRI and was convinced that I was at the appropriate level.  Once I confirmed the appropriate level I marked out the incision site infiltrated with quarter percent Marcaine.  Incision was made sharp dissection was carried out down to the deep fascia.  The deep fascia was sharply incised I strip the paraspinal muscles to expose the L5S1 space.  A Penfield 4 was placed on the lamina and x-ray was taken.  Once I confirm the appropriate level laminotomy was performed with a 3 mm Kerrison rongeur.  I dissected through the ligamentum flavum with a Penfield 4 and then released the ligamentum flavum with my 2 and 3 mm Kerrison punch at this point I was able to dissect using a Penfield forward to the lateral recess.  I coagulated the epidural veins and then identified the disc space.  Upon inspection of the nerve root itself it was not very mobile.  I then realized there was a hard osteophyte make disc fragment adherent to the nerve itself.  This is actually compressed the nerve.  He was at this point that I felt like this was probably the major source of her radicular pain.  Using a Penfield 4 I gently dissected free of the nerve root and then resected it.  At this point the nerve root was now completely freely mobile and no longer under tension.  I did sweep into the disc space just  to confirm that there was no other fragments of disc material.  At this point I could freely pass my [facility] elevator inferiorly into the lateral recess and out the L5 5 S1's foramen and then superiorly towards the L5.  There Was No Significant Lateral Recess or Foraminal Compromise.  The fragment of disc material that was compressing the nerve was removed.  The nerve was no longer under tension I irrigated the wound copes with normal saline and made sure that hemostasis using bipolar cautery.  I did place 20 mg of Depo-Medrol  over the nerve to aid in postoperative analgesia given the manipulation that was required during surgery.  I then closed the wound in a layered fashion with interrupted #1 Vicryl suture, 2-0 Vicryl suture, 3-0 Monocryl.  Steri-Strips dry dressing were applied and the patient was ultimately extubated and transferred to the PACU without incident.  At the end of the case all needle and sponge counts were correct.  There are no adverse intraoperative events.

## 2016-11-10 NOTE — Brief Op Note (Signed)
11/10/2016  1:50 PM  PATIENT:  Jacky KindleAnnie L Handel  56 y.o. female  PRE-OPERATIVE DIAGNOSIS:  L5-S1 left HNP  POST-OPERATIVE DIAGNOSIS:  L5-S1 left HNP  PROCEDURE:  Procedure(s) with comments: Left L5-S1 discectomy (Left) - 120 mins  SURGEON:  Surgeon(s) and Role:    Venita Lick* Amarya Kuehl, MD - Primary  PHYSICIAN ASSISTANT:   ASSISTANTS: Carmen Mayo PA   ANESTHESIA:   general  EBL:  30 mL   BLOOD ADMINISTERED:none  DRAINS: none   LOCAL MEDICATIONS USED:  MARCAINE     SPECIMEN:  No Specimen  DISPOSITION OF SPECIMEN:  N/A  COUNTS:  YES  TOURNIQUET:  * No tourniquets in log *  DICTATION: .Dragon Dictation  PLAN OF CARE: Admit for overnight observation  PATIENT DISPOSITION:  PACU - hemodynamically stable.

## 2016-11-10 NOTE — Evaluation (Signed)
Physical Therapy Evaluation Patient Details Name: Melissa Roth MRN: [id number] DOB: 01/10/1960 Today's Date: 11/10/2016   History of Present Illness  Pt is a 56 y/o female s/p L5-S1 discectomy. Pt with no pertinent PMH.   Clinical Impression  Patient is s/p above surgery resulting in the deficits listed below (see PT Problem List). PTA, pt was independent with mobility. Upon eval, pt limited by pain, nausea, and presented with L knee buckling and decreased balance. Refused use of AD and required min to min guard assist for mobility. Reports family will be able to assist as needed upon d/c. Will continue to progress mobility to determine DME needs.  Patient will benefit from skilled PT to increase their independence and safety with mobility (while adhering to their precautions) to allow discharge to the venue listed below. Will continue to follow acutely.      Follow Up Recommendations No PT follow up;Supervision for mobility/OOB    Equipment Recommendations  Other (comment)(TBD pending pt progress )    Recommendations for Other Services       Precautions / Restrictions Precautions Precautions: Back Precaution Booklet Issued: Yes (comment) Precaution Comments: Reviewed back precautions with pt and family.  Required Braces or Orthoses: Spinal Brace Spinal Brace: Lumbar corset;Applied in sitting position Restrictions Weight Bearing Restrictions: No      Mobility  Bed Mobility Overal bed mobility: Needs Assistance Bed Mobility: Rolling;Sidelying to Sit;Sit to Sidelying Rolling: Supervision Sidelying to sit: Min assist     Sit to sidelying: Min guard General bed mobility comments: Min A for trunk elevation. Verbal cues for use of log roll technique.   Transfers Overall transfer level: Needs assistance Equipment used: 1 person hand held assist Transfers: Sit to/from Stand Sit to Stand: Min assist         General transfer comment: Min A for steadying assist. Verbal cues  to power through LEs.   Ambulation/Gait Ambulation/Gait assistance: Min guard;Min assist Ambulation Distance (Feet): 50 Feet Assistive device: 1 person hand held assist Gait Pattern/deviations: Step-through pattern;Decreased stride length(L knee buckling ) Gait velocity: Decreased  Gait velocity interpretation: Below normal speed for age/gender General Gait Details: Slow, unsteady gait. Min to min guard for steadying assist. Pt exhibiting L knee buckling and required cues for knee extension during stance phase. Refusing use of RW at this time. Pt reporting nausea during gait, so distance limited.   Stairs            Wheelchair Mobility    Modified Rankin (Stroke Patients Only)       Balance Overall balance assessment: Needs assistance Sitting-balance support: No upper extremity supported;Feet supported Sitting balance-Leahy Scale: Good     Standing balance support: Single extremity supported;During functional activity Standing balance-Leahy Scale: Poor Standing balance comment: Reliant on single UE support and external support for balance.                              Pertinent Vitals/Pain Pain Assessment: 0-10 Pain Score: 5  Pain Location: back  Pain Descriptors / Indicators: Aching;Operative site guarding Pain Intervention(s): Limited activity within patient's tolerance;Monitored during session;Repositioned    Home Living Family/patient expects to be discharged to:: Private residence Living Arrangements: Spouse/significant other Available Help at Discharge: Family;Available 24 hours/day Type of Home: House Home Access: Level entry     Home Layout: Two level Home Equipment: Shower seat - built in      Prior Function Level of Independence: Independent  Hand Dominance        Extremity/Trunk Assessment   Upper Extremity Assessment Upper Extremity Assessment: Defer to OT evaluation    Lower Extremity Assessment Lower  Extremity Assessment: LLE deficits/detail LLE Deficits / Details: functional weakness noted, as pt's L knee was buckling during ambulation. Reports pain is much better in LLE.     Cervical / Trunk Assessment Cervical / Trunk Assessment: Other exceptions Cervical / Trunk Exceptions: s/p back surgery  Communication   Communication: No difficulties  Cognition Arousal/Alertness: Awake/alert Behavior During Therapy: WFL for tasks assessed/performed Overall Cognitive Status: Within Functional Limits for tasks assessed                                        General Comments General comments (skin integrity, edema, etc.): Pt family present during session. Educated about generalized exercise program to perform at home.     Exercises     Assessment/Plan    PT Assessment Patient needs continued PT services  PT Problem List Decreased strength;Decreased activity tolerance;Decreased balance;Decreased mobility;Decreased knowledge of use of DME;Decreased knowledge of precautions;Pain       PT Treatment Interventions DME instruction;Stair training;Gait training;Functional mobility training;Therapeutic activities;Balance training;Therapeutic exercise;Neuromuscular re-education;Patient/family education    PT Goals (Current goals can be found in the Care Plan section)  Acute Rehab PT Goals Patient Stated Goal: to feel better  PT Goal Formulation: With patient Time For Goal Achievement: 11/17/16 Potential to Achieve Goals: Good    Frequency Min 5X/week   Barriers to discharge        Co-evaluation               AM-PAC PT "6 Clicks" Daily Activity  Outcome Measure Difficulty turning over in bed (including adjusting bedclothes, sheets and blankets)?: A Little Difficulty moving from lying on back to sitting on the side of the bed? : A Little Difficulty sitting down on and standing up from a chair with arms (e.g., wheelchair, bedside commode, etc,.)?: Unable Help needed  moving to and from a bed to chair (including a wheelchair)?: A Little Help needed walking in hospital room?: A Little Help needed climbing 3-5 steps with a railing? : A Little 6 Click Score: 16    End of Session Equipment Utilized During Treatment: Gait belt;Back brace Activity Tolerance: Treatment limited secondary to medical complications (Comment)(nausea ) Patient left: in bed;with call bell/phone within reach Nurse Communication: Mobility status PT Visit Diagnosis: Other abnormalities of gait and mobility (R26.89);Pain Pain - part of body: (back )    Time: [id number] PT Time Calculation (min) (ACUTE ONLY): 25 min   Charges:   PT Evaluation $PT Eval Low Complexity: 1 Low PT Treatments $Gait Training: 8-22 mins   PT G Codes:   PT G-Codes **NOT FOR INPATIENT CLASS** Functional Assessment Tool Used: AM-PAC 6 Clicks Basic Mobility Functional Limitation: Mobility: Walking and moving around Mobility: Walking and Moving Around Current Status ([id number](G8978): At least 40 percent but less than 60 percent impaired, limited or restricted Mobility: Walking and Moving Around Goal Status [id number](G8979): At least 1 percent but less than 20 percent impaired, limited or restricted    Gladys DammeBrittany Kynslie Ringle, PT, DPT  Acute Rehabilitation Services  Pager: [phone]   Lehman PromBrittany S Shaquala Broeker 11/10/2016, 5:20 PM

## 2016-11-10 NOTE — Transfer of Care (Signed)
Immediate Anesthesia Transfer of Care Note  Patient: [name]  Procedure(s) Performed: Left L5-S1 discectomy (Left )  Patient Location: PACU  Anesthesia Type:General  Level of Consciousness: awake, oriented and patient cooperative  Airway & Oxygen Therapy: Patient Spontanous Breathing and Patient connected to face mask oxygen  Post-op Assessment: Report given to RN and Post -op Vital signs reviewed and stable  Post vital signs: Reviewed  Last Vitals:  Vitals:   11/10/16 0922  BP: (!) 145/71  Pulse: 73  Resp: 18  Temp: 36.8 C  SpO2: 100%    Last Pain:  Vitals:   11/10/16 0926  TempSrc:   PainSc: 2       Patients Stated Pain Goal: 2 (11/10/16 0926)  Complications: No apparent anesthesia complications

## 2016-11-10 NOTE — Anesthesia Preprocedure Evaluation (Addendum)
Anesthesia Evaluation  Patient identified by MRN, date of birth, ID band Patient awake    Reviewed: Allergy & Precautions, NPO status , Patient's Chart, lab work & pertinent test results  History of Anesthesia Complications (+) PONV and history of anesthetic complications  Airway Mallampati: II  TM Distance: >3 FB Neck ROM: Full    Dental no notable dental hx. (+) Teeth Intact, Dental Advisory Given   Pulmonary neg pulmonary ROS,    Pulmonary exam normal breath sounds clear to auscultation       Cardiovascular negative cardio ROS Normal cardiovascular exam Rhythm:Regular Rate:Normal  ECG: SR, rate 62 Pre-op eval per PCP Zachery Dauer([place])   Neuro/Psych negative neurological ROS  negative psych ROS   GI/Hepatic negative GI ROS, Neg liver ROS,   Endo/Other  negative endocrine ROS  Renal/GU negative Renal ROS     Musculoskeletal Sciatica   Abdominal   Peds  Hematology negative hematology ROS (+)   Anesthesia Other Findings L5-S1 left HNP  Reproductive/Obstetrics                           Anesthesia Physical Anesthesia Plan  ASA: II  Anesthesia Plan: General   Post-op Pain Management:    Induction: Intravenous  PONV Risk Score and Plan: 4 or greater and Ondansetron, Dexamethasone and Midazolam  Airway Management Planned: Oral ETT  Additional Equipment:   Intra-op Plan:   Post-operative Plan: Extubation in OR  Informed Consent: I have reviewed the patients History and Physical, chart, labs and discussed the procedure including the risks, benefits and alternatives for the proposed anesthesia with the patient or authorized representative who has indicated his/her understanding and acceptance.   Dental advisory given  Plan Discussed with: CRNA  Anesthesia Plan Comments:        Anesthesia Quick Evaluation

## 2016-11-10 NOTE — Discharge Instructions (Signed)
Lumbar Diskectomy, Care After °Refer to this sheet in the next few weeks. These instructions provide you with information about caring for yourself after your procedure. Your health care provider may also give you more specific instructions. Your treatment has been planned according to current medical practices, but problems sometimes occur. Call your health care provider if you have any problems or questions after your procedure. °What can I expect after the procedure? °After the procedure, it is common to have: °· Pain. °· Numbness. °· Weakness. ° °Follow these instructions at home: °Medicines °· Take medicines only as directed by your health care provider. °· If you were prescribed an antibiotic medicine, finish all of it even if you start to feel better. °Incision care °· There are many different ways to close and cover an incision, including stitches (sutures), skin glue, and adhesive strips. Follow your health care provider's instructions about: °? Incision care. °? Bandage (dressing) changes and removal. °? Incision closure removal. °· Check your incision area every day for signs of infection. If you cannot see your incision, have someone check it for you. Watch for: °? Redness, swelling, or pain. °? Fluid, blood, or pus. °Activity °· Avoid sitting for longer than 20 minutes at a time or as directed by your health care provider. °· Do not climb stairs more than once each day until your health care provider approves. °· Do not bend at your waist. To pick things up, bend your knees. °· Do not lift anything that is heavier than 10 lb (4.5 kg) or as directed by your health care provider. °· Do not drive a car until your health care provider approves. °· Ask your health care provider when you may return to your normal activities, such as playing sports and going back to work. °· Work with your physical therapist to learn safe movement and exercises to help healing. Do these exercises as directed. °· Take short  walks often. °General instructions °· Do not use any tobacco products, including cigarettes, chewing tobacco, or electronic cigarettes. If you need help quitting, ask your health care provider. °· Follow your health care provider’s instructions about bathing. Do not take baths, shower, swim, or use a hot tub until your health care provider approves. °· Wear your back brace as directed by your health care provider. °· To prevent constipation: °? Drink enough fluid to keep your urine clear or pale yellow. °? Eat plenty of fruits, vegetables, and whole grains. °· Keep all follow-up visits as directed by your health care provider. This is important. This includes any follow-up visits with your physical therapist. °Contact a health care provider if: °· You have a fever. °· You have redness, swelling, or pain in your incision area. °· Your pain is not controlled with medicine. °· You have pain, numbness, or weakness that lasts longer than three weeks after surgery. °· You become constipated. °Get help right away if: °· You have fluid, blood, or pus coming from your incision. °· You have increasing pain, numbness, or weakness. °· You lose control of when you urinate or have a bowel movement (incontinence). °· You have chest pain. °· You have trouble breathing. °This information is not intended to replace advice given to you by your health care provider. Make sure you discuss any questions you have with your health care provider. °Document Released: 11/26/2003 Document Revised: 05/29/2015 Document Reviewed: 08/15/2013 °[employer] Interactive Patient Education © 2018 [employer] ° °

## 2016-11-10 NOTE — H&P (Signed)
History of Present Illness The patient is a 56 year old female who comes in today for a preoperative History and Physical. The patient is scheduled for a L5-S1 disectomy to be performed by Dr. Debria Garretahari D. Shon BatonBrooks, MD on 11-10-16 . Pt reports a hx of good health. The patient reports pain (left buttock and leg to just below the knee, to the foot on Sat) and decreased range of motion involving the low back (left leg) which began 10 day(s) ago in association with an established activity (had been working out). The patient describes their pain as rating it as 9 / 10 on a ten-point analog pain scale and feels as if they are unchanged. Symptoms are exacerbated by walking. The patient indicates that their symptoms are relieved with standing and opioid analgesics. Associated symptoms include loss of bowel control (just 1 incident on Thurs) and leg pain, while associated symptoms do not include loss of bladder control or leg weakness. Current treatment includes oral corticosteroids (has 2 doses left) and non-opioid analgesics (Tramadol and Lyrica). Past evaluation has included orthopedic consultation and MRI. Past treatment has included non-opioid analgesics, opioid analgesics, activity modification and epidural injections.  Problem List/Past Medical Cervical pain (M54.2)  Whiplash, initial encounter (S13.4XXA)  HNP (herniated nucleus pulposus), lumbar (M51.26)  Lumbar back pain with radiculopathy affecting left lower extremity (M54.16)  Degeneration of intervertebral disc at C5-C6 level (M50.322)  Problems Reconciled   Allergies SULFA [06/13/2008]: and IVP DYE Allergies Reconciled   Family History  Heart Disease  Father. Hypertension  Brother, Mother. Osteoporosis  Mother.  Social History  Children  1 Current drinker  03/19/2014: Currently drinks wine only occasionally per week Current work status  working full time Exercise  Exercises daily; does running / walking and gym / weights Living  situation  live with parents Marital status  divorced No history of drug/alcohol rehab  Not under pain contract  Tobacco / smoke exposure  03/19/2014: no Tobacco use  Never smoker. 03/19/2014  Medication History  Lyrica (75MG  Capsule, 1 (one) Capsule Oral two times daily, Taken starting 10/22/2016) Active. (am and hs) TraMADol HCl (50MG  Tablet, 1 (one) Tablet Oral three times daily, as needed, Taken starting 10/22/2016) Active. Norco (10-325MG  Tablet, 1 (one) Oral three times daily, as needed, Taken starting 10/16/2016) Active. Multiple Vitamins/Womens (1 (one) Oral) Active. Medications Reconciled  Pregnancy / Birth History  Pregnant  no  Past Surgical History Hysterectomy  partial (non-cancerous)  Other Problems  Unspecified Diagnosis    Physical Exam  General General Appearance-Not in acute distress. Orientation-Oriented X3. Build & Nutrition-Well nourished and Well developed.  Integumentary General Characteristics Surgical Scars - no surgical scar evidence of previous lumbar surgery. Lumbar Spine-Skin examination of the lumbar spine is without deformity, skin lesions, lacerations or abrasions.  Chest and Lung Exam Auscultation Breath sounds - Normal and Clear.  Abdomen Palpation/Percussion Palpation and Percussion of the abdomen reveal - Soft, Non Tender and No Rebound tenderness.  Peripheral Vascular Lower Extremity Palpation - Posterior tibial pulse - Bilateral - 2+. Dorsalis pedis pulse - Bilateral - 2+.  Neurologic Sensation Lower Extremity - Left - sensation is diminished in the lower extremity. Right - sensation is intact in the lower extremity. Reflexes Patellar Reflex - Bilateral - 2+. Achilles Reflex - Bilateral - 2+. Clonus - Bilateral - clonus not present. Hoffman's Sign - Bilateral - Hoffman's sign not present. Testing Seated Straight Leg Raise - Left - Seated straight leg raise positive. Right - Seated straight leg raise  negative.  Musculoskeletal Spine/Ribs/Pelvis  Lumbosacral Spine: Inspection and Palpation - Tenderness - left lumbar paraspinals tender to palpation and right lumbar paraspinals tender to palpation. Strength and Tone: Strength - Hip Flexion - Bilateral - 5/5. Knee Extension - Bilateral - 5/5. Knee Flexion - Bilateral - 5/5. Ankle Dorsiflexion - Left - 4-/5. Right - 5/5. Ankle Plantarflexion - Bilateral - 5/5. Heel walk - Bilateral - unable to heel walk. Toe Walk - Bilateral - unable to walk on toes. ROM - Flexion - moderately decreased range of motion and painful. Extension - moderately decreased range of motion and painful. Left Lateral Bending - moderately decreased range of motion and painful. Right Lateral Bending - moderately decreased range of motion and painful. Right Rotation - moderately decreased range of motion and painful. Left Rotation - moderately decreased range of motion and painful. Pain - neither flexion or extension is more painful than the other. Lumbosacral Spine - Waddell's Signs - no Waddell's signs present. Lower Extremity Range of Motion - No true hip, knee or ankle pain with range of motion. Gait and Station - [employer] - no assistive devices.  MRI was significant for a disc extrusion moderate thickening of ligamentum flavum contacting bilateral S1 nerve roots displacement of left S1.  Assessment & Plan  Goal Of Surgery: Discussed that goal of surgery is to reduce pain and improve function and quality of life. Patient is aware that despite all appropriate treatment that there pain and function could be the same, worse, or different.  Posterior Lumbar Decompression/disectomy: Risks of surgery include infection, bleeding, nerve damage, death, stroke, paralysis, failure to heal, need for further surgery, ongoing or worse pain, need for further surgery, CSF leak, loss of bowel or bladder, and recurrent disc herniation or Stenosis which would necessitate need for further  surgery.  The patient has had issues with her back for some time now. In the past she has been treated with physical therapy andan epidural injections. However last week she started experiencing horrific leg pain which is a new component of her overall pain. She states that this leg pain far exceeds her back pain It prevents her from concentrating, she is losing sleep, and overall her quality of life is significantly been impaired. At this point we need to get an updated MRI as her clinical exam is consistent with a disc herniation.  As a result we will plan on moving forward with microdiscectomy and decompression.  I did review all the risks and benefits with the patient today clinical exam is unchanged.

## 2016-11-10 NOTE — Progress Notes (Signed)
Report given to steve shaw rn as caregiver 

## 2016-11-11 ENCOUNTER — Encounter (HOSPITAL_COMMUNITY): Payer: Self-pay | Admitting: Orthopedic Surgery

## 2016-11-11 DIAGNOSIS — M5117 Intervertebral disc disorders with radiculopathy, lumbosacral region: Secondary | ICD-10-CM | POA: Diagnosis not present

## 2016-11-11 MED ORDER — HYDROXYZINE HCL 50 MG/ML IM SOLN
50.0000 mg | Freq: Four times a day (QID) | INTRAMUSCULAR | Status: DC | PRN
  Administered 2016-11-11: 50 mg via INTRAMUSCULAR
  Filled 2016-11-11: qty 1

## 2016-11-11 NOTE — Plan of Care (Signed)
  Progressing Safety: Ability to remain free from injury will improve 11/11/2016 0316 - Progressing by Wille CelesteMadriaga-Acosta, [place] Natarajan D, RN Activity: Ability to avoid complications of mobility impairment will improve 11/11/2016 0316 - Progressing by Wille CelesteMadriaga-Acosta, Andera Cranmer D, RN Ability to tolerate increased activity will improve 11/11/2016 0316 - Progressing by Wille CelesteMadriaga-Acosta, Glynn Freas D, RN Will remain free from falls 11/11/2016 0316 - Progressing by Wille CelesteMadriaga-Acosta, Tawyna Pellot D, RN Education: Ability to verbalize activity precautions or restrictions will improve 11/11/2016 0316 - Progressing by Wille CelesteMadriaga-Acosta, Jules Vidovich D, RN Knowledge of the prescribed therapeutic regimen will improve 11/11/2016 0316 - Progressing by Wille CelesteMadriaga-Acosta, Kaleah Hagemeister D, RN Understanding of discharge needs will improve 11/11/2016 0316 - Progressing by Wille CelesteMadriaga-Acosta, Taite Schoeppner D, RN Physical Regulation: Ability to maintain clinical measurements within normal limits will improve 11/11/2016 0316 - Progressing by Wille CelesteMadriaga-Acosta, Laurieann Friddle D, RN Postoperative complications will be avoided or minimized 11/11/2016 0316 - Progressing by Wille CelesteMadriaga-Acosta, Graden Hoshino D, RN Diagnostic test results will improve 11/11/2016 0316 - Progressing by Wille CelesteMadriaga-Acosta, Sara Selvidge D, RN Pain Management: Pain level will decrease 11/11/2016 0316 - Progressing by Wille CelesteMadriaga-Acosta, Craven Crean D, RN Skin Integrity: Signs of wound healing will improve 11/11/2016 0316 - Progressing by Wille CelesteMadriaga-Acosta, Dmoni Fortson D, RN Health Behavior/Discharge Planning: Identification of resources available to assist in meeting health care needs will improve 11/11/2016 0316 - Progressing by Wille CelesteMadriaga-Acosta, Ernesto Zukowski D, RN

## 2016-11-11 NOTE — Progress Notes (Signed)
    Subjective: 1 Day Post-Op Procedure(s) (LRB): Left L5-S1 discectomy (Left) Patient reports pain as 8 on 0-10 scale.   Denies CP or SOB.  Voiding without difficulty. Positive flatus.Pt reports nausea. Pt has been ambulating in hallway.  Objective: Vital signs in last 24 hours: Temp:  [97.5 F (36.4 C)-98.9 F (37.2 C)] 98.4 F (36.9 C) (11/08 0352) Pulse Rate:  [51-90] 71 (11/08 0352) Resp:  [14-20] 18 (11/08 0352) BP: (94-145)/(56-88) 113/59 (11/08 0352) SpO2:  [95 %-100 %] 96 % (11/08 0352) Weight:  [77.4 kg (170 lb 10.2 oz)] 77.4 kg (170 lb 10.2 oz) (11/07 0922)  Intake/Output from previous day: 11/07 0701 - 11/08 0700 In: 3044.2 [P.O.:960; I.V.:1984.2; IV Piggyback:100] Out: 30 [Blood:30] Intake/Output this shift: No intake/output data recorded.  Labs: No results for input(s): HGB in the last 72 hours. No results for input(s): WBC, RBC, HCT, PLT in the last 72 hours. No results for input(s): NA, K, CL, CO2, BUN, CREATININE, GLUCOSE, CALCIUM in the last 72 hours. No results for input(s): LABPT, INR in the last 72 hours.  Physical Exam: Neurologically intact ABD soft Sensation intact distally Dorsiflexion/Plantar flexion intact Incision: scant drainage Compartment soft  Assessment/Plan: 1 Day Post-Op Procedure(s) (LRB): Left L5-S1 discectomy (Left) Advance diet Up with therapy  Hopeful to DC today  Malissa Slay, Baxter Kailarmen Christina for Dr. Venita Lickahari Brooks [facility] [phone] 11/11/2016, 7:15 AM

## 2016-11-11 NOTE — Progress Notes (Signed)
Physical Therapy Treatment Patient Details Name: Melissa Roth MRN: [id number] DOB: 12/27/1960 Today's Date: 11/11/2016    History of Present Illness Pt is a 56 y/o female s/p L5-S1 discectomy. Pt with no pertinent PMH.     PT Comments    Pt making good progress towards achieving her current functional mobility goals. Pt successfully completed stair training this session with husband present throughout. PT reviewed 3/3 back precautions with pt.    Follow Up Recommendations  No PT follow up;Supervision for mobility/OOB     Equipment Recommendations  None recommended by PT    Recommendations for Other Services       Precautions / Restrictions Precautions Precautions: Back Precaution Booklet Issued: No Precaution Comments: Educated pt on 3/3 back precautions. Required Braces or Orthoses: Spinal Brace Spinal Brace: Lumbar corset;Applied in sitting position Restrictions Weight Bearing Restrictions: No    Mobility  Bed Mobility Overal bed mobility: Needs Assistance Bed Mobility: Rolling;Sidelying to Sit Rolling: Supervision Sidelying to sit: Supervision       General bed mobility comments: pt sitting EOB upon PT arrival  Transfers Overall transfer level: Needs assistance Equipment used: None Transfers: Sit to/from Stand Sit to Stand: Supervision         General transfer comment: supervision for safety  Ambulation/Gait Ambulation/Gait assistance: Min guard Ambulation Distance (Feet): 300 Feet Assistive device: None Gait Pattern/deviations: Step-through pattern;Decreased stride length;Drifts right/left Gait velocity: Decreased  Gait velocity interpretation: Below normal speed for age/gender General Gait Details: slow, mildly unsteady gait, requiring close min guard for safety   Stairs Stairs: Yes   Stair Management: One rail Left;Sideways;Step to pattern Number of Stairs: 4 General stair comments: min guard for safety, no LOB or need for physical  assistance. Pt's husband present throughout training as well  Wheelchair Mobility    Modified Rankin (Stroke Patients Only)       Balance Overall balance assessment: Needs assistance Sitting-balance support: Feet supported;No upper extremity supported Sitting balance-Leahy Scale: Good     Standing balance support: No upper extremity supported;During functional activity Standing balance-Leahy Scale: Fair                              Cognition Arousal/Alertness: Awake/alert Behavior During Therapy: WFL for tasks assessed/performed Overall Cognitive Status: Within Functional Limits for tasks assessed                                        Exercises      General Comments        Pertinent Vitals/Pain Pain Assessment: 0-10 Pain Score: 5  Pain Location: back  Pain Descriptors / Indicators: Aching;Sore Pain Intervention(s): Monitored during session;Repositioned    Home Living Family/patient expects to be discharged to:: Private residence Living Arrangements: Spouse/significant other Available Help at Discharge: Family;Available 24 hours/day Type of Home: House Home Access: Level entry   Home Layout: Two level Home Equipment: Shower seat - built in      Prior Function Level of Independence: Independent          PT Goals (current goals can now be found in the care plan section) Acute Rehab PT Goals Patient Stated Goal: to feel better  PT Goal Formulation: With patient Time For Goal Achievement: 11/17/16 Potential to Achieve Goals: Good Progress towards PT goals: Progressing toward goals    Frequency    Min  5X/week      PT Plan Current plan remains appropriate    Co-evaluation              AM-PAC PT "6 Clicks" Daily Activity  Outcome Measure  Difficulty turning over in bed (including adjusting bedclothes, sheets and blankets)?: A Little Difficulty moving from lying on back to sitting on the side of the bed? : A  Little Difficulty sitting down on and standing up from a chair with arms (e.g., wheelchair, bedside commode, etc,.)?: A Little Help needed moving to and from a bed to chair (including a wheelchair)?: A Little Help needed walking in hospital room?: A Little Help needed climbing 3-5 steps with a railing? : A Little 6 Click Score: 18    End of Session Equipment Utilized During Treatment: Gait belt;Back brace Activity Tolerance: Patient tolerated treatment well Patient left: with call bell/phone within reach;with family/visitor present;Other (comment)(sitting EOB) Nurse Communication: Mobility status PT Visit Diagnosis: Other abnormalities of gait and mobility (R26.89);Pain Pain - part of body: (back)     Time: [id number] PT Time Calculation (min) (ACUTE ONLY): 18 min  Charges:  $Gait Training: 8-22 mins                    G Codes:       MunsonJennifer Damiana Berrian, [place]PT, [place]DPT [phone]    Alessandra BevelsJennifer M Aunya Lemler 11/11/2016, 11:38 AM

## 2016-11-11 NOTE — Progress Notes (Signed)
Pt and family given D/C instructions with Rx's, verbal understanding was provided. Pt's incision is clean and dry with no sign of infection. Pt's IV was removed prior to D/C. Pt D/C'd home via wheelchair per MD order. Pt is stable @ D/C and has no other needs at this time. Brisha Mccabe, RN  

## 2016-11-11 NOTE — Evaluation (Signed)
Occupational Therapy Evaluation Patient Details Name: Melissa Roth MRN: [id number] DOB: 08-05-1960 Today's Date: 11/11/2016    History of Present Illness Pt is a 56 y/o female s/p L5-S1 discectomy. Pt with no pertinent PMH.    Clinical Impression   Pt reports she was independent with ADL PTA. Currently pt min assist ADL and min guard with functional mobility. Began back, safety, and ADL education with pt. Pt planning to d/c home with 24/7 supervision from family. Pt would benefit from continued skilled OT to address established goals.    Follow Up Recommendations  No OT follow up;Supervision/Assistance - 24 hour    Equipment Recommendations  None recommended by OT    Recommendations for Other Services       Precautions / Restrictions Precautions Precautions: Back Precaution Booklet Issued: No Precaution Comments: Educated pt on 3/3 back precautions. Required Braces or Orthoses: Spinal Brace Spinal Brace: Lumbar corset;Applied in sitting position Restrictions Weight Bearing Restrictions: No      Mobility Bed Mobility Overal bed mobility: Needs Assistance Bed Mobility: Rolling;Sidelying to Sit Rolling: Supervision Sidelying to sit: Supervision       General bed mobility comments: Cues for log roll technique but no physical assist required. HOB flat with use of bed rail.  Transfers Overall transfer level: Needs assistance Equipment used: None Transfers: Sit to/from Stand Sit to Stand: Min guard         General transfer comment: Min guard for safety with sit to stand due to mild unsteadiness.    Balance Overall balance assessment: Needs assistance Sitting-balance support: Feet supported;No upper extremity supported Sitting balance-Leahy Scale: Good     Standing balance support: No upper extremity supported;During functional activity Standing balance-Leahy Scale: Fair                             ADL either performed or assessed with clinical  judgement   ADL Overall ADL's : Needs assistance/impaired Eating/Feeding: Set up;Sitting   Grooming: Min guard;Standing Grooming Details (indicate cue type and reason): Educated on use of 2 cups for oral care Upper Body Bathing: Sitting;Set up   Lower Body Bathing: Minimal assistance;Sit to/from stand   Upper Body Dressing : Sitting;Minimal assistance Upper Body Dressing Details (indicate cue type and reason): for brace Lower Body Dressing: Minimal assistance;Sit to/from stand Lower Body Dressing Details (indicate cue type and reason): Pt with difficulty crossing foot over opposite knee. Family to assist as needed Toilet Transfer: Min guard;Ambulation Toilet Transfer Details (indicate cue type and reason): Simulated by sit to stand from EOB with functional mobility   Toileting - Clothing Manipulation Details (indicate cue type and reason): Educated pt no twisting during peri care and use of wet wipes Tub/ Shower Transfer: Supervision/safety;Walk-in shower;Ambulation;Shower Field seismologistseat Tub/Shower Transfer Details (indicate cue type and reason): Supervision for safety with transfer. Recommend use of shower seat initially for safety Functional mobility during ADLs: Min guard General ADL Comments: Educated pt on maintaining back precautions during functional activities, keeping frequently used items at [employer] Insurancecoutner top height, frequent mobility thorughout the day upon return home, brace management and wear schedule.     Vision         Perception     Praxis      Pertinent Vitals/Pain Pain Assessment: 0-10 Pain Score: 7  Pain Location: back  Pain Descriptors / Indicators: Aching;Sore Pain Intervention(s): Limited activity within patient's tolerance;Monitored during session;Repositioned;Patient requesting pain meds-RN notified     Hand Dominance  Extremity/Trunk Assessment Upper Extremity Assessment Upper Extremity Assessment: Overall WFL for tasks assessed   Lower Extremity  Assessment Lower Extremity Assessment: Defer to PT evaluation   Cervical / Trunk Assessment Cervical / Trunk Assessment: Other exceptions Cervical / Trunk Exceptions: s/p back surgery   Communication Communication Communication: No difficulties   Cognition Arousal/Alertness: Awake/alert Behavior During Therapy: WFL for tasks assessed/performed Overall Cognitive Status: Within Functional Limits for tasks assessed                                     General Comments       Exercises     Shoulder Instructions      Home Living Family/patient expects to be discharged to:: Private residence Living Arrangements: Spouse/significant other Available Help at Discharge: Family;Available 24 hours/day Type of Home: House Home Access: Level entry     Home Layout: Two level Alternate Level Stairs-Number of Steps: flight  Alternate Level Stairs-Rails: Left Bathroom Shower/Tub: Producer, television/film/videoWalk-in shower   Bathroom Toilet: Handicapped height     Home Equipment: Shower seat - built in          Prior Functioning/Environment Level of Independence: Independent                 OT Problem List: Decreased strength;Decreased activity tolerance;Impaired balance (sitting and/or standing);Decreased knowledge of use of DME or AE;Decreased knowledge of precautions;Pain      OT Treatment/Interventions: Self-care/ADL training;Energy conservation;DME and/or AE instruction;Therapeutic activities;Patient/family education;Balance training    OT Goals(Current goals can be found in the care plan section) Acute Rehab OT Goals Patient Stated Goal: to feel better  OT Goal Formulation: With patient Time For Goal Achievement: 11/25/16 Potential to Achieve Goals: Good ADL Goals Pt Will Perform Lower Body Bathing: with supervision;sit to/from stand Pt Will Perform Lower Body Dressing: with supervision;sit to/from stand Additional ADL Goal #1: Pt will independently verbally recall 3/3 back  precautions and maintain throughout ADL. Additional ADL Goal #2: Pt will don/doff back brace with set up as precursor to ADL.  OT Frequency: Min 2X/week   Barriers to D/C:            Co-evaluation              AM-PAC PT "6 Clicks" Daily Activity     Outcome Measure Help from another person eating meals?: None Help from another person taking care of personal grooming?: A Little Help from another person toileting, which includes using toliet, bedpan, or urinal?: A Little Help from another person bathing (including washing, rinsing, drying)?: A Little Help from another person to put on and taking off regular upper body clothing?: None Help from another person to put on and taking off regular lower body clothing?: A Little 6 Click Score: 20   End of Session Equipment Utilized During Treatment: Back brace Nurse Communication: Mobility status;Other (comment)(no equipment or f/u needs)  Activity Tolerance: Patient tolerated treatment well Patient left: with call bell/phone within reach;with family/visitor present(sitting EOB)  OT Visit Diagnosis: Unsteadiness on feet (R26.81);Other abnormalities of gait and mobility (R26.89);Pain Pain - part of body: (back)                Time: [id number] OT Time Calculation (min): 13 min Charges:  OT General Charges $OT Visit: 1 Visit OT Evaluation $OT Eval Moderate Complexity: 1 Mod G-Codes: OT G-codes **NOT FOR INPATIENT CLASS** Functional Assessment Tool Used: Clinical judgement Functional Limitation: Self care  Self Care Current Status [phone]): At least 20 percent but less than 40 percent impaired, limited or restricted Self Care Goal Status (U0454): At least 1 percent but less than 20 percent impaired, limited or restricted   Fredric Mare A. Brett Albino, M.S., OTR/L Pager: [phone]  Gaye Alken 11/11/2016, 9:16 AM

## 2016-11-11 NOTE — Anesthesia Postprocedure Evaluation (Signed)
Anesthesia Post Note  Patient: Melissa Roth  Procedure(s) Performed: Left L5-S1 discectomy (Left )     Patient location during evaluation: PACU Anesthesia Type: General Level of consciousness: awake and alert Pain management: pain level controlled Vital Signs Assessment: post-procedure vital signs reviewed and stable Respiratory status: spontaneous breathing, nonlabored ventilation, respiratory function stable and patient connected to nasal cannula oxygen Cardiovascular status: blood pressure returned to baseline and stable Postop Assessment: no apparent nausea or vomiting Anesthetic complications: no    Last Vitals:  Vitals:   11/11/16 0352 11/11/16 0800  BP: (!) 113/59 (!) 103/57  Pulse: 71 65  Resp: 18 16  Temp: 36.9 C 36.7 C  SpO2: 96% 99%    Last Pain:  Vitals:   11/11/16 0820  TempSrc:   PainSc: 4                  Ryan P Ellender

## 2016-11-16 NOTE — Discharge Summary (Signed)
Physician Discharge Summary  Patient ID: Melissa Roth MRN: [id number] DOB/AGE: [birth year] 56 y.o.  Admit date: 11/10/2016 Discharge date: 11/11/16  Admission Diagnoses:  L5-S1 HNP  Discharge Diagnoses:  Active Problems:   Status post lumbar spine surgery for decompression of spinal cord   Past Medical History:  Diagnosis Date  . Cervical disc disorder with radiculopathy of cervical region   . PONV (postoperative nausea and vomiting)   . Sciatica     Surgeries: Procedure(s): Left L5-S1 discectomy on 11/10/2016   Consultants (if any):   Discharged Condition: Improved  Hospital Course: Melissa Roth is an 56 y.o. female who was admitted 11/10/2016 with a diagnosis of L5-S1 HNP and went to the operating room on 11/10/2016 and underwent the above named procedures.  Post op day one pt reports moderate pain.  Pt does reports pain medication decreases pain.  Pt has been ambulating in hallway.  Pt denies issues with urination.  Pt did experience ome nausea overnight but it was resolving in the am. Pt is cleared for DC by PT.   She was given perioperative antibiotics:  Anti-infectives (From admission, onward)   Start     Dose/Rate Route Frequency Ordered Stop   11/10/16 2030  ceFAZolin (ANCEF) IVPB 2g/100 mL premix     2 g 200 mL/hr over 30 Minutes Intravenous Every 8 hours 11/10/16 1538 11/11/16 0512   11/10/16 0920  ceFAZolin (ANCEF) 2-4 GM/100ML-% IVPB    Comments:  Hazlip, Jessica   : cabinet override      11/10/16 0920 11/10/16 1223   11/10/16 0917  ceFAZolin (ANCEF) IVPB 2g/100 mL premix     2 g 200 mL/hr over 30 Minutes Intravenous 30 min pre-op 11/10/16 0917 11/10/16 1223    .  She was given sequential compression devices, early ambulation, and TED for DVT prophylaxis.  She benefited maximally from the hospital stay and there were no complications.    Recent vital signs:  Vitals:   11/11/16 0352 11/11/16 0800  BP: (!) 113/59 (!) 103/57  Pulse: 71 65  Resp: 18 16   Temp: 98.4 F (36.9 C) 98.1 F (36.7 C)  SpO2: 96% 99%    Recent laboratory studies:  Lab Results  Component Value Date   HGB 13.2 11/03/2016   Lab Results  Component Value Date   WBC 4.4 11/03/2016   PLT 256 11/03/2016   No results found for: INR Lab Results  Component Value Date   NA 142 11/03/2016   K 4.3 11/03/2016   CL 108 11/03/2016   CO2 27 11/03/2016   BUN 8 11/03/2016   CREATININE 0.77 11/03/2016   GLUCOSE 99 11/03/2016    Discharge Medications:   Allergies as of 11/11/2016      Reactions   Ivp Dye [iodinated Diagnostic Agents]    Sulfa Antibiotics       Medication List    STOP taking these medications   traMADol 50 MG tablet Commonly known as:  ULTRAM     TAKE these medications   baclofen 10 MG tablet Commonly known as:  LIORESAL Take 1 tablet (10 mg total) 3 (three) times daily by mouth.   ondansetron 4 MG tablet Commonly known as:  ZOFRAN Take 4 mg by mouth every 8 (eight) hours as needed for nausea or vomiting.   oxyCODONE-acetaminophen 10-325 MG tablet Commonly known as:  PERCOCET Take 1 tablet every 4 (four) hours as needed for up to 7 days by mouth for pain.   pregabalin 75  MG capsule Commonly known as:  LYRICA Take 75 mg by mouth 2 (two) times daily.   Vitamin D (Ergocalciferol) 50000 units Caps capsule Commonly known as:  DRISDOL Take 50,000 Units by mouth every 7 (seven) days.       Diagnostic Studies: Dg Lumbar Spine 2-3 Views  Result Date: 11/10/2016 CLINICAL DATA:  L5-S1 discectomy. EXAM: LUMBAR SPINE - 2-3 VIEW COMPARISON:  MRI of 07/20/2003.  Plain films of 09/25/2012. FINDINGS: Single lateral view labeled 1234 hours. Presuming nomenclature from the prior MRI, this demonstrates surgical devices projecting posterior to the L4 and L5 vertebral bodies. The last open disc space is presumed L5-S1, as on the prior MRI and plain films. IMPRESSION: Intraoperative localization of L4 and L5. Report called and discussed with clinical  service at 1:09 p.m. Electronically Signed   By: Jeronimo GreavesKyle  Talbot M.D.   On: 11/10/2016 13:11    Disposition: 01-Home or Self Care Pt will present to clinic in two weeks Post op medications provided  Discharge Instructions    Incentive spirometry RT   Complete by:  As directed       Follow-up Information    Venita LickBrooks, Dahari, MD Follow up.   Specialty:  Orthopedic Surgery Contact information: [address] [place] [address] [phone]            Signed: Kirt BoysMayo, Alejandrina Raimer Christina 11/16/2016, 12:18 PM

## 2016-12-27 ENCOUNTER — Ambulatory Visit
Admission: RE | Admit: 2016-12-27 | Discharge: 2016-12-27 | Disposition: A | Discharge status: Home / Self Care | Payer: 59 | Source: Ambulatory Visit | Attending: Family Medicine | Admitting: Family Medicine

## 2016-12-27 DIAGNOSIS — Z0000 Encounter for general adult medical examination without abnormal findings: Secondary | ICD-10-CM

## 2017-01-12 ENCOUNTER — Other Ambulatory Visit: Payer: Self-pay | Admitting: Orthopedic Surgery

## 2017-01-12 DIAGNOSIS — Z4789 Encounter for other orthopedic aftercare: Secondary | ICD-10-CM

## 2017-01-27 ENCOUNTER — Other Ambulatory Visit: Payer: 59

## 2017-01-28 ENCOUNTER — Ambulatory Visit
Admission: RE | Admit: 2017-01-28 | Discharge: 2017-01-28 | Disposition: A | Discharge status: Home / Self Care | Payer: 59 | Source: Ambulatory Visit | Attending: Orthopedic Surgery | Admitting: Orthopedic Surgery

## 2017-01-28 DIAGNOSIS — Z4789 Encounter for other orthopedic aftercare: Secondary | ICD-10-CM

## 2017-01-28 MED ORDER — IOPAMIDOL (ISOVUE-M 300) INJECTION 61%
1.0000 mL | Freq: Once | INTRAMUSCULAR | Status: AC | PRN
  Administered 2017-01-28: 1 mL via EPIDURAL

## 2017-01-28 MED ORDER — METHYLPREDNISOLONE ACETATE 40 MG/ML INJ SUSP (RADIOLOG
120.0000 mg | Freq: Once | INTRAMUSCULAR | Status: AC
  Administered 2017-01-28: 120 mg via EPIDURAL

## 2017-01-28 NOTE — Discharge Instructions (Signed)

## 2017-02-09 ENCOUNTER — Other Ambulatory Visit: Payer: Self-pay | Admitting: Family Medicine

## 2017-02-09 DIAGNOSIS — K7689 Other specified diseases of liver: Secondary | ICD-10-CM

## 2017-02-21 ENCOUNTER — Other Ambulatory Visit: Payer: 59

## 2017-03-18 ENCOUNTER — Other Ambulatory Visit: Payer: Self-pay | Admitting: Family Medicine

## 2017-03-18 DIAGNOSIS — R935 Abnormal findings on diagnostic imaging of other abdominal regions, including retroperitoneum: Secondary | ICD-10-CM

## 2017-03-18 DIAGNOSIS — K7689 Other specified diseases of liver: Secondary | ICD-10-CM

## 2017-03-28 DIAGNOSIS — M5030 Other cervical disc degeneration, unspecified cervical region: Secondary | ICD-10-CM | POA: Insufficient documentation

## 2017-03-28 DIAGNOSIS — M961 Postlaminectomy syndrome, not elsewhere classified: Secondary | ICD-10-CM | POA: Insufficient documentation

## 2017-04-04 ENCOUNTER — Other Ambulatory Visit: Payer: Self-pay | Admitting: Physical Medicine and Rehabilitation

## 2017-04-04 ENCOUNTER — Ambulatory Visit
Admission: RE | Admit: 2017-04-04 | Discharge: 2017-04-04 | Disposition: A | Discharge status: Home / Self Care | Payer: 59 | Source: Ambulatory Visit | Attending: Family Medicine | Admitting: Family Medicine

## 2017-04-04 DIAGNOSIS — K7689 Other specified diseases of liver: Secondary | ICD-10-CM

## 2017-04-04 DIAGNOSIS — M5030 Other cervical disc degeneration, unspecified cervical region: Secondary | ICD-10-CM

## 2017-04-22 ENCOUNTER — Inpatient Hospital Stay
Admission: RE | Admit: 2017-04-22 | Discharge: 2017-04-22 | Disposition: A | Discharge status: Home / Self Care | Payer: 59 | Source: Ambulatory Visit | Attending: Physical Medicine and Rehabilitation | Admitting: Physical Medicine and Rehabilitation

## 2017-05-03 ENCOUNTER — Ambulatory Visit: Payer: Self-pay | Admitting: General Surgery

## 2017-05-09 ENCOUNTER — Inpatient Hospital Stay (HOSPITAL_COMMUNITY): Admission: RE | Admit: 2017-05-09 | Payer: 59 | Source: Ambulatory Visit

## 2017-06-03 NOTE — Pre-Procedure Instructions (Signed)
SUEKO DIMICHELE  06/03/2017      [id number] - [place], [place] - [address] AT [facility] [address] [place] [address] Phone: [phone] Fax: [phone]  [id number] Lorenza Evangelist, [place] - [address] [place] [address] Phone: [phone] Fax: [phone]    Your procedure is scheduled on June 4.  Report to [facility] Admitting at 530 A.M.  Call this number if you have problems the morning of surgery:  [phone]   Remember:  No food after midnight.     Take these medicines the morning of surgery with A SIP OF WATER  Ondansetron (Zofran) if needed Pregablin  (Lyrica) if needed Baclofen (Lioresal ) if needed  Stop taking Aspirin, BC's, Goody's, Herbal medications, Fish Oil, Aleve, Ibuprofen, Advil, Motrin, Vitamins    Do not wear jewelry, make-up or nail polish.  Do not wear lotions, powders, or perfumes, or deodorant.  Do not shave 48 hours prior to surgery.  Men may shave face and neck.  Do not bring valuables to the hospital.  [facility] is not responsible for any belongings or valuables.  Contacts, dentures or bridgework may not be worn into surgery.  Leave your suitcase in the car.  After surgery it may be brought to your room.  For patients admitted to the hospital, discharge time will be determined by your treatment team.  Patients discharged the day of surgery will not be allowed to drive home.    Special instructions:   [place]- Preparing For Surgery  Before surgery, you can play an important role. Because skin is not sterile, your skin needs to be as free of germs as possible. You can reduce the number of germs on your skin by washing with CHG (chlorahexidine gluconate) Soap before surgery.  CHG is an antiseptic cleaner which kills germs and bonds with the skin to continue killing germs even after washing.    Oral Hygiene is also important to  reduce your risk of infection.  Remember - BRUSH YOUR TEETH THE MORNING OF SURGERY WITH YOUR REGULAR TOOTHPASTE  Please do not use if you have an allergy to CHG or antibacterial soaps. If your skin becomes reddened/irritated stop using the CHG.  Do not shave (including legs and underarms) for at least 48 hours prior to first CHG shower. It is OK to shave your face.  Please follow these instructions carefully.   1. Shower the NIGHT BEFORE SURGERY and the MORNING OF SURGERY with CHG.   2. If you chose to wash your hair, wash your hair first as usual with your normal shampoo.  3. After you shampoo, rinse your hair and body thoroughly to remove the shampoo.  4. Use CHG as you would any other liquid soap. You can apply CHG directly to the skin and wash gently with a scrungie or a clean washcloth.   5. Apply the CHG Soap to your body ONLY FROM THE NECK DOWN.  Do not use on open wounds or open sores. Avoid contact with your eyes, ears, mouth and genitals (private parts). Wash Face and genitals (private parts)  with your normal soap.  6. Wash thoroughly, paying special attention to the area where your surgery will be performed.  7. Thoroughly rinse your body with warm water from the neck down.  8. DO NOT shower/wash with your normal soap after using and rinsing off the CHG Soap.  9. Pat yourself dry  with a CLEAN TOWEL.  10. Wear CLEAN PAJAMAS to bed the night before surgery, wear comfortable clothes the morning of surgery  11. Place CLEAN SHEETS on your bed the night of your first shower and DO NOT SLEEP WITH PETS.    Day of Surgery:  Do not apply any deodorants/lotions.  Please wear clean clothes to the hospital/surgery center.   Remember to brush your teeth WITH YOUR REGULAR TOOTHPASTE.    Please read over the following fact sheets that you were given. Pain Booklet, Coughing and Deep Breathing and Surgical Site Infection Prevention

## 2017-06-06 ENCOUNTER — Encounter (HOSPITAL_COMMUNITY)
Admission: RE | Admit: 2017-06-06 | Discharge: 2017-06-06 | Disposition: A | Discharge status: Home / Self Care | Payer: 59 | Source: Ambulatory Visit | Attending: General Surgery | Admitting: General Surgery

## 2017-06-06 ENCOUNTER — Other Ambulatory Visit: Payer: Self-pay

## 2017-06-06 ENCOUNTER — Encounter (HOSPITAL_COMMUNITY): Payer: Self-pay

## 2017-06-06 ENCOUNTER — Ambulatory Visit (HOSPITAL_COMMUNITY)
Admission: RE | Admit: 2017-06-06 | Discharge: 2017-06-06 | Disposition: A | Discharge status: Home / Self Care | Payer: 59 | Source: Ambulatory Visit | Attending: General Surgery | Admitting: General Surgery

## 2017-06-06 DIAGNOSIS — K824 Cholesterolosis of gallbladder: Secondary | ICD-10-CM | POA: Diagnosis not present

## 2017-06-06 DIAGNOSIS — Z01818 Encounter for other preprocedural examination: Secondary | ICD-10-CM

## 2017-06-06 LAB — CBC WITH DIFFERENTIAL/PLATELET
Abs Immature Granulocytes: 0 10*3/uL (ref 0.0–0.1)
Basophils Absolute: 0 10*3/uL (ref 0.0–0.1)
Basophils Relative: 1 %
Eosinophils Absolute: 0.1 10*3/uL (ref 0.0–0.7)
Eosinophils Relative: 2 %
HEMATOCRIT: 39.6 % (ref 36.0–46.0)
HEMOGLOBIN: 12.6 g/dL (ref 12.0–15.0)
IMMATURE GRANULOCYTES: 0 %
LYMPHS ABS: 1.6 10*3/uL (ref 0.7–4.0)
LYMPHS PCT: 29 %
MCH: 30.4 pg (ref 26.0–34.0)
MCHC: 31.8 g/dL (ref 30.0–36.0)
MCV: 95.7 fL (ref 78.0–100.0)
Monocytes Absolute: 0.4 10*3/uL (ref 0.1–1.0)
Monocytes Relative: 7 %
NEUTROS PCT: 61 %
Neutro Abs: 3.3 10*3/uL (ref 1.7–7.7)
Platelets: 285 10*3/uL (ref 150–400)
RBC: 4.14 MIL/uL (ref 3.87–5.11)
RDW: 13.4 % (ref 11.5–15.5)
WBC: 5.3 10*3/uL (ref 4.0–10.5)

## 2017-06-06 LAB — COMPREHENSIVE METABOLIC PANEL
ALBUMIN: 3.9 g/dL (ref 3.5–5.0)
ALK PHOS: 81 U/L (ref 38–126)
ALT: 15 U/L (ref 14–54)
AST: 25 U/L (ref 15–41)
Anion gap: 8 (ref 5–15)
BILIRUBIN TOTAL: 0.4 mg/dL (ref 0.3–1.2)
BUN: 12 mg/dL (ref 6–20)
CALCIUM: 9.2 mg/dL (ref 8.9–10.3)
CO2: 26 mmol/L (ref 22–32)
Chloride: 108 mmol/L (ref 101–111)
Creatinine, Ser: 0.75 mg/dL (ref 0.44–1.00)
GFR calc Af Amer: >60 mL/min (ref >60)
GFR calc non Af Amer: >60 mL/min (ref >60)
GLUCOSE: 103 mg/dL — AB (ref 65–99)
Potassium: 3.5 mmol/L (ref 3.5–5.1)
Sodium: 142 mmol/L (ref 135–145)
TOTAL PROTEIN: 7.2 g/dL (ref 6.5–8.1)

## 2017-06-07 ENCOUNTER — Ambulatory Visit (HOSPITAL_COMMUNITY): Payer: 59 | Admitting: Anesthesiology

## 2017-06-07 ENCOUNTER — Encounter (HOSPITAL_COMMUNITY)
Admission: RE | Disposition: A | Discharge status: Home / Self Care | Payer: Self-pay | Source: Ambulatory Visit | Attending: General Surgery

## 2017-06-07 ENCOUNTER — Ambulatory Visit (HOSPITAL_COMMUNITY)
Admission: RE | Admit: 2017-06-07 | Discharge: 2017-06-07 | Disposition: A | Discharge status: Home / Self Care | Payer: 59 | Source: Ambulatory Visit | Attending: General Surgery | Admitting: General Surgery

## 2017-06-07 ENCOUNTER — Encounter (HOSPITAL_COMMUNITY): Payer: Self-pay | Admitting: Anesthesiology

## 2017-06-07 DIAGNOSIS — Z8249 Family history of ischemic heart disease and other diseases of the circulatory system: Secondary | ICD-10-CM | POA: Diagnosis not present

## 2017-06-07 DIAGNOSIS — Z8261 Family history of arthritis: Secondary | ICD-10-CM | POA: Insufficient documentation

## 2017-06-07 DIAGNOSIS — Z79899 Other long term (current) drug therapy: Secondary | ICD-10-CM | POA: Diagnosis not present

## 2017-06-07 DIAGNOSIS — K7689 Other specified diseases of liver: Secondary | ICD-10-CM | POA: Insufficient documentation

## 2017-06-07 DIAGNOSIS — Z811 Family history of alcohol abuse and dependence: Secondary | ICD-10-CM | POA: Insufficient documentation

## 2017-06-07 DIAGNOSIS — M549 Dorsalgia, unspecified: Secondary | ICD-10-CM | POA: Diagnosis not present

## 2017-06-07 DIAGNOSIS — K824 Cholesterolosis of gallbladder: Secondary | ICD-10-CM | POA: Diagnosis present

## 2017-06-07 DIAGNOSIS — K811 Chronic cholecystitis: Secondary | ICD-10-CM | POA: Insufficient documentation

## 2017-06-07 HISTORY — PX: CHOLECYSTECTOMY: SHX55

## 2017-06-07 SURGERY — LAPAROSCOPIC CHOLECYSTECTOMY
Anesthesia: General | Site: Abdomen

## 2017-06-07 MED ORDER — OXYCODONE HCL 5 MG PO TABS: 5.0000 mg | ORAL_TABLET | Freq: Four times a day (QID) | ORAL | 0 refills | Status: DC | PRN

## 2017-06-07 MED ORDER — KETOROLAC TROMETHAMINE 30 MG/ML IJ SOLN
INTRAMUSCULAR | Status: DC | PRN
  Administered 2017-06-07: 30 mg via INTRAVENOUS

## 2017-06-07 MED ORDER — PROPOFOL 10 MG/ML IV BOLUS
INTRAVENOUS | Status: AC
  Filled 2017-06-07: qty 20

## 2017-06-07 MED ORDER — SODIUM CHLORIDE 0.9 % IR SOLN
Status: DC | PRN
  Administered 2017-06-07: 1000 mL

## 2017-06-07 MED ORDER — LIDOCAINE 2% (20 MG/ML) 5 ML SYRINGE
INTRAMUSCULAR | Status: DC | PRN
  Administered 2017-06-07: 60 mg via INTRAVENOUS

## 2017-06-07 MED ORDER — DEXAMETHASONE SODIUM PHOSPHATE 10 MG/ML IJ SOLN
INTRAMUSCULAR | Status: DC | PRN
  Administered 2017-06-07: 10 mg via INTRAVENOUS

## 2017-06-07 MED ORDER — GABAPENTIN 300 MG PO CAPS
ORAL_CAPSULE | ORAL | Status: AC
  Administered 2017-06-07: 300 mg via ORAL
  Filled 2017-06-07: qty 1

## 2017-06-07 MED ORDER — SUGAMMADEX SODIUM 200 MG/2ML IV SOLN
INTRAVENOUS | Status: AC
  Filled 2017-06-07: qty 2

## 2017-06-07 MED ORDER — MIDAZOLAM HCL 2 MG/2ML IJ SOLN
INTRAMUSCULAR | Status: AC
  Filled 2017-06-07: qty 2

## 2017-06-07 MED ORDER — PROPOFOL 500 MG/50ML IV EMUL
INTRAVENOUS | Status: DC | PRN
  Administered 2017-06-07: 20 ug/kg/min via INTRAVENOUS

## 2017-06-07 MED ORDER — ACETAMINOPHEN 500 MG PO TABS
ORAL_TABLET | ORAL | Status: AC
  Administered 2017-06-07: 1000 mg via ORAL
  Filled 2017-06-07: qty 2

## 2017-06-07 MED ORDER — GABAPENTIN 300 MG PO CAPS
300.0000 mg | ORAL_CAPSULE | ORAL | Status: AC
  Administered 2017-06-07: 300 mg via ORAL

## 2017-06-07 MED ORDER — ROCURONIUM BROMIDE 10 MG/ML (PF) SYRINGE
PREFILLED_SYRINGE | INTRAVENOUS | Status: AC
  Filled 2017-06-07: qty 5

## 2017-06-07 MED ORDER — SCOPOLAMINE 1 MG/3DAYS TD PT72
1.0000 | MEDICATED_PATCH | Freq: Once | TRANSDERMAL | Status: DC
  Administered 2017-06-07: 1.5 mg via TRANSDERMAL

## 2017-06-07 MED ORDER — ROCURONIUM BROMIDE 10 MG/ML (PF) SYRINGE
PREFILLED_SYRINGE | INTRAVENOUS | Status: DC | PRN
  Administered 2017-06-07: 50 mg via INTRAVENOUS

## 2017-06-07 MED ORDER — PHENYLEPHRINE 40 MCG/ML (10ML) SYRINGE FOR IV PUSH (FOR BLOOD PRESSURE SUPPORT)
PREFILLED_SYRINGE | INTRAVENOUS | Status: AC
  Filled 2017-06-07: qty 10

## 2017-06-07 MED ORDER — BUPIVACAINE-EPINEPHRINE (PF) 0.25% -1:200000 IJ SOLN
INTRAMUSCULAR | Status: AC
  Filled 2017-06-07: qty 30

## 2017-06-07 MED ORDER — LACTATED RINGERS IV SOLN
INTRAVENOUS | Status: DC | PRN
  Administered 2017-06-07 (×2): via INTRAVENOUS

## 2017-06-07 MED ORDER — 0.9 % SODIUM CHLORIDE (POUR BTL) OPTIME
TOPICAL | Status: DC | PRN
  Administered 2017-06-07: 1000 mL

## 2017-06-07 MED ORDER — LIDOCAINE 2% (20 MG/ML) 5 ML SYRINGE
INTRAMUSCULAR | Status: AC
  Filled 2017-06-07: qty 5

## 2017-06-07 MED ORDER — MIDAZOLAM HCL 5 MG/5ML IJ SOLN
INTRAMUSCULAR | Status: DC | PRN
  Administered 2017-06-07: 2 mg via INTRAVENOUS

## 2017-06-07 MED ORDER — OXYCODONE HCL 5 MG/5ML PO SOLN: 5.0000 mg | Freq: Once | ORAL | Status: DC | PRN

## 2017-06-07 MED ORDER — CHLORHEXIDINE GLUCONATE CLOTH 2 % EX PADS: 6.0000 | MEDICATED_PAD | Freq: Once | CUTANEOUS | Status: DC

## 2017-06-07 MED ORDER — FENTANYL CITRATE (PF) 250 MCG/5ML IJ SOLN
INTRAMUSCULAR | Status: DC | PRN
  Administered 2017-06-07 (×4): 50 ug via INTRAVENOUS

## 2017-06-07 MED ORDER — SODIUM CHLORIDE 0.9 % IV SOLN
INTRAVENOUS | Status: AC
  Filled 2017-06-07: qty 2

## 2017-06-07 MED ORDER — OXYCODONE HCL 5 MG PO TABS: 5.0000 mg | ORAL_TABLET | Freq: Once | ORAL | Status: DC | PRN

## 2017-06-07 MED ORDER — BUPIVACAINE-EPINEPHRINE 0.25% -1:200000 IJ SOLN
INTRAMUSCULAR | Status: DC | PRN
  Administered 2017-06-07: 12 mL

## 2017-06-07 MED ORDER — ONDANSETRON HCL 4 MG/2ML IJ SOLN: 4.0000 mg | Freq: Four times a day (QID) | INTRAMUSCULAR | Status: DC | PRN

## 2017-06-07 MED ORDER — SCOPOLAMINE 1 MG/3DAYS TD PT72
MEDICATED_PATCH | TRANSDERMAL | Status: AC
  Administered 2017-06-07: 1.5 mg via TRANSDERMAL
  Filled 2017-06-07: qty 1

## 2017-06-07 MED ORDER — SUGAMMADEX SODIUM 200 MG/2ML IV SOLN
INTRAVENOUS | Status: DC | PRN
  Administered 2017-06-07: 144.2 mg via INTRAVENOUS

## 2017-06-07 MED ORDER — ONDANSETRON HCL 4 MG/2ML IJ SOLN
INTRAMUSCULAR | Status: AC
  Filled 2017-06-07: qty 2

## 2017-06-07 MED ORDER — KETOROLAC TROMETHAMINE 30 MG/ML IJ SOLN
INTRAMUSCULAR | Status: AC
  Filled 2017-06-07: qty 1

## 2017-06-07 MED ORDER — FENTANYL CITRATE (PF) 250 MCG/5ML IJ SOLN
INTRAMUSCULAR | Status: AC
  Filled 2017-06-07: qty 5

## 2017-06-07 MED ORDER — PROPOFOL 10 MG/ML IV BOLUS
INTRAVENOUS | Status: DC | PRN
  Administered 2017-06-07: 170 mg via INTRAVENOUS

## 2017-06-07 MED ORDER — CEFOTETAN DISODIUM-DEXTROSE 2-2.08 GM-%(50ML) IV SOLR
2.0000 g | INTRAVENOUS | Status: AC
  Administered 2017-06-07: 2 g via INTRAVENOUS

## 2017-06-07 MED ORDER — DEXAMETHASONE SODIUM PHOSPHATE 10 MG/ML IJ SOLN
INTRAMUSCULAR | Status: AC
  Filled 2017-06-07: qty 1

## 2017-06-07 MED ORDER — FENTANYL CITRATE (PF) 100 MCG/2ML IJ SOLN: 25.0000 ug | INTRAMUSCULAR | Status: DC | PRN

## 2017-06-07 MED ORDER — ONDANSETRON HCL 4 MG/2ML IJ SOLN
INTRAMUSCULAR | Status: DC | PRN
Start: 2017-06-07 — End: 2017-06-07
  Administered 2017-06-07: 4 mg via INTRAVENOUS

## 2017-06-07 MED ORDER — ACETAMINOPHEN 500 MG PO TABS
1000.0000 mg | ORAL_TABLET | ORAL | Status: AC
  Administered 2017-06-07: 1000 mg via ORAL

## 2017-06-07 SURGICAL SUPPLY — 40 items
ADH SKN CLS APL DERMABOND .7 (GAUZE/BANDAGES/DRESSINGS) ×1
APPLIER CLIP 5 13 M/L LIGAMAX5 (MISCELLANEOUS) ×3
APR CLP MED LRG 5 ANG JAW (MISCELLANEOUS) ×1
BAG SPEC RTRVL 10 TROC 200 (ENDOMECHANICALS)
CANISTER SUCT 3000ML PPV (MISCELLANEOUS) ×3 IMPLANT
CHLORAPREP W/TINT 26ML (MISCELLANEOUS) ×3 IMPLANT
CLIP APPLIE 5 13 M/L LIGAMAX5 (MISCELLANEOUS) ×1 IMPLANT
CLOSURE STERI-STRIP 1/2X4 (GAUZE/BANDAGES/DRESSINGS) ×1
CLOSURE WOUND 1/2 X4 (GAUZE/BANDAGES/DRESSINGS) ×1
CLSR STERI-STRIP ANTIMIC 1/2X4 (GAUZE/BANDAGES/DRESSINGS) ×2 IMPLANT
COVER SURGICAL LIGHT HANDLE (MISCELLANEOUS) ×3 IMPLANT
DERMABOND ADVANCED (GAUZE/BANDAGES/DRESSINGS) ×2
DERMABOND ADVANCED .7 DNX12 (GAUZE/BANDAGES/DRESSINGS) ×1 IMPLANT
DRSG TEGADERM 2-3/8X2-3/4 SM (GAUZE/BANDAGES/DRESSINGS) ×12 IMPLANT
DRSG TEGADERM 4X4.75 (GAUZE/BANDAGES/DRESSINGS) ×3 IMPLANT
ELECT REM PT RETURN 9FT ADLT (ELECTROSURGICAL) ×3
ELECTRODE REM PT RTRN 9FT ADLT (ELECTROSURGICAL) ×1 IMPLANT
GLOVE BIOGEL PI IND STRL 8 (GLOVE) ×1 IMPLANT
GLOVE BIOGEL PI INDICATOR 8 (GLOVE) ×2
GLOVE ECLIPSE 7.5 STRL STRAW (GLOVE) ×3 IMPLANT
GOWN STRL REUS W/ TWL LRG LVL3 (GOWN DISPOSABLE) ×3 IMPLANT
GOWN STRL REUS W/TWL LRG LVL3 (GOWN DISPOSABLE) ×9
KIT BASIN OR (CUSTOM PROCEDURE TRAY) ×3 IMPLANT
KIT TURNOVER KIT B (KITS) ×3 IMPLANT
NS IRRIG 1000ML POUR BTL (IV SOLUTION) ×3 IMPLANT
PAD ARMBOARD 7.5X6 YLW CONV (MISCELLANEOUS) ×3 IMPLANT
POUCH RETRIEVAL ECOSAC 10 (ENDOMECHANICALS) IMPLANT
POUCH RETRIEVAL ECOSAC 10MM (ENDOMECHANICALS)
SCISSORS LAP 5X35 DISP (ENDOMECHANICALS) ×3 IMPLANT
SET IRRIG TUBING LAPAROSCOPIC (IRRIGATION / IRRIGATOR) ×3 IMPLANT
SLEEVE ENDOPATH XCEL 5M (ENDOMECHANICALS) ×6 IMPLANT
SPECIMEN JAR SMALL (MISCELLANEOUS) ×3 IMPLANT
STRIP CLOSURE SKIN 1/2X4 (GAUZE/BANDAGES/DRESSINGS) ×2 IMPLANT
SUT MNCRL AB 4-0 PS2 18 (SUTURE) ×3 IMPLANT
TOWEL OR 17X24 6PK STRL BLUE (TOWEL DISPOSABLE) ×3 IMPLANT
TRAY LAPAROSCOPIC MC (CUSTOM PROCEDURE TRAY) ×3 IMPLANT
TROCAR XCEL BLUNT TIP 100MML (ENDOMECHANICALS) ×3 IMPLANT
TROCAR XCEL NON-BLD 5MMX100MML (ENDOMECHANICALS) ×3 IMPLANT
TUBING INSUFFLATION (TUBING) ×3 IMPLANT
WATER STERILE IRR 1000ML POUR (IV SOLUTION) ×3 IMPLANT

## 2017-06-07 NOTE — Transfer of Care (Signed)
Immediate Anesthesia Transfer of Care Note  Patient: Melissa Roth  Procedure(s) Performed: LAPAROSCOPIC CHOLECYSTECTOMY (N/A Abdomen)  Patient Location: PACU  Anesthesia Type:General  Level of Consciousness: awake, alert , drowsy and patient cooperative  Airway & Oxygen Therapy: Patient Spontanous Breathing and Patient connected to nasal cannula oxygen  Post-op Assessment: Report given to RN and Post -op Vital signs reviewed and stable  Post vital signs: Reviewed and stable  Last Vitals:  Vitals Value Taken Time  BP 103/71 06/07/2017  8:25 AM  Temp    Pulse 58 06/07/2017  8:26 AM  Resp 9 06/07/2017  8:26 AM  SpO2 100 % 06/07/2017  8:26 AM  Vitals shown include unvalidated device data.  Last Pain:  Vitals:   06/07/17 0614  TempSrc:   PainSc: 0-No pain         Complications: No apparent anesthesia complications

## 2017-06-07 NOTE — Anesthesia Preprocedure Evaluation (Signed)
Anesthesia Evaluation  Patient identified by MRN, date of birth, ID band Patient awake    Reviewed: Allergy & Precautions, H&P , NPO status , Patient's Chart, lab work & pertinent test results  History of Anesthesia Complications (+) PONV and history of anesthetic complications  Airway Mallampati: II   Neck ROM: full    Dental   Pulmonary neg pulmonary ROS,    breath sounds clear to auscultation       Cardiovascular negative cardio ROS   Rhythm:regular Rate:Normal     Neuro/Psych  Neuromuscular disease    GI/Hepatic Gallbladder polyp   Endo/Other    Renal/GU      Musculoskeletal   Abdominal   Peds  Hematology   Anesthesia Other Findings   Reproductive/Obstetrics                             Anesthesia Physical Anesthesia Plan  ASA: II  Anesthesia Plan: General   Post-op Pain Management:    Induction: Intravenous  PONV Risk Score and Plan: 4 or greater and Ondansetron, Dexamethasone, Midazolam, Scopolamine patch - Pre-op and Treatment may vary due to age or medical condition  Airway Management Planned: Oral ETT  Additional Equipment:   Intra-op Plan:   Post-operative Plan: Extubation in OR  Informed Consent: I have reviewed the patients History and Physical, chart, labs and discussed the procedure including the risks, benefits and alternatives for the proposed anesthesia with the patient or authorized representative who has indicated his/her understanding and acceptance.     Plan Discussed with: CRNA, Anesthesiologist and Surgeon  Anesthesia Plan Comments:         Anesthesia Quick Evaluation

## 2017-06-07 NOTE — Discharge Instructions (Addendum)
Laparoscopic Cholecystectomy, Care After This sheet gives you information about how to care for yourself after your procedure. Your doctor may also give you more specific instructions. If you have problems or questions, contact your doctor. Follow these instructions at home: Care for cuts from surgery (incisions)   Follow instructions from your doctor about how to take care of your cuts from surgery. Make sure you: ? Wash your hands with soap and water before you change your bandage (dressing). If you cannot use soap and water, use hand sanitizer. ? Change your bandage as told by your doctor. ? Leave stitches (sutures), skin glue, or skin tape (adhesive) strips in place. They may need to stay in place for 2 weeks or longer. If tape strips get loose and curl up, you may trim the loose edges. Do not remove tape strips completely unless your doctor says it is okay.  Do not take baths, swim, or use a hot tub until your doctor says it is okay. Ask your doctor if you can take showers. You may only be allowed to take sponge baths for bathing.  You may shower and leave palstic dressings intact until seen in clinic  Check your surgical cut area every day for signs of infection. Check for: ? More redness, swelling, or pain. ? More fluid or blood. ? Warmth. ? Pus or a bad smell. Activity  Do not drive or use heavy machinery while taking prescription pain medicine.  Do not lift anything that is heavier than 10 lb (4.5 kg) until your doctor says it is okay.  Do not play contact sports until your doctor says it is okay.  Do not drive for 24 hours if you were given a medicine to help you relax (sedative).  Rest as needed. Do not return to work or school until your doctor says it is okay. General instructions  Take over-the-counter and prescription medicines only as told by your doctor.  To prevent or treat constipation while you are taking prescription pain medicine, your doctor may recommend that  you: ? Drink enough fluid to keep your pee (urine) clear or pale yellow. ? Take over-the-counter or prescription medicines. ? Eat foods that are high in fiber, such as fresh fruits and vegetables, whole grains, and beans. ? Limit foods that are high in fat and processed sugars, such as fried and sweet foods. Contact a doctor if:  You develop a rash.  You have more redness, swelling, or pain around your surgical cuts.  You have more fluid or blood coming from your surgical cuts.  Your surgical cuts feel warm to the touch.  You have pus or a bad smell coming from your surgical cuts.  You have a fever.  One or more of your surgical cuts breaks open. Get help right away if:  You have trouble breathing.  You have chest pain.  You have pain that is getting worse in your shoulders.  You faint or feel dizzy when you stand.  You have very bad pain in your belly (abdomen).  You are sick to your stomach (nauseous) for more than one day.  You have throwing up (vomiting) that lasts for more than one day.  You have leg pain. This information is not intended to replace advice given to you by your health care provider. Make sure you discuss any questions you have with your health care provider.  Marta LamasJames O. Gae BonWyatt, III, MD, FACS [phone]--pager [phone]--office [facility]

## 2017-06-07 NOTE — H&P (Signed)
Melissa Roth Documented: 05/03/2017 9:09 AM Location: [facility] Patient #: [id number] DOB: [birth year] Married / Language: English / Race: Black or African American Female   History of Present Illness Melissa Roth. Melissa Spruce MD; 05/03/2017 9:33 AM) Patient words: Referred for possible blockage and gallbladder polyp. Minimal symptoms, but somtimes intolerant of onions and cabbage. Had recent back surgery and MRI showed some GB and liver lesions. It appears as though the liver lesions are cysts with further workup.  The patient is a 57 year old female.   Diagnostic Studies History (Tanisha A. Manson Passey, RMA; 05/03/2017 9:09 AM) Colonoscopy  5-10 years ago Mammogram  within last year  Allergies (Tanisha A. Manson Passey, RMA; 05/03/2017 9:10 AM) No Known Drug Allergies [05/03/2017]: Allergies Reconciled   Medication History (Tanisha A. Manson Passey, RMA; 05/03/2017 9:11 AM) Baclofen (10MG  Tablet, Oral) Active. Ibuprofen (600MG  Tablet, Oral) Active. Lyrica (75MG  Capsule, Oral) Active. Multi-Vitamin (Oral) Active. Medications Reconciled  Social History (Tanisha A. Manson Passey, RMA; 05/03/2017 9:09 AM) Alcohol use  Moderate alcohol use. Caffeine use  Carbonated beverages, Coffee, Tea. No drug use  Tobacco use  Never smoker.  Family History (Tanisha A. Manson Passey, RMA; 05/03/2017 9:09 AM) Alcohol Abuse  Brother, Father. Arthritis  Brother, Father, Mother. Heart Disease  Father. Hypertension  Brother, Father, Mother.  Pregnancy / Birth History (Tanisha A. Manson Passey, RMA; 05/03/2017 9:09 AM) Age at menarche  17 years. Gravida  1 Maternal age  98-30 Para  1  Other Problems (Tanisha A. Manson Passey, RMA; 05/03/2017 9:09 AM) Back Pain     Review of Systems (Tanisha A. Brown RMA; 05/03/2017 9:09 AM) General Present- Appetite Loss, Fatigue and Weight Loss. Not Present- Chills, Fever, Night Sweats and Weight Gain. Skin Not Present- Change in Wart/Mole, Dryness, Hives, Jaundice, New Lesions,  Non-Healing Wounds, Rash and Ulcer. HEENT Present- Seasonal Allergies and Wears glasses/contact lenses. Not Present- Earache, Hearing Loss, Hoarseness, Nose Bleed, Oral Ulcers, Ringing in the Ears, Sinus Pain, Sore Throat, Visual Disturbances and Yellow Eyes. Respiratory Not Present- Bloody sputum, Chronic Cough, Difficulty Breathing, Snoring and Wheezing. Breast Not Present- Breast Mass, Breast Pain, Nipple Discharge and Skin Changes. Cardiovascular Not Present- Chest Pain, Difficulty Breathing Lying Down, Leg Cramps, Palpitations, Rapid Heart Rate, Shortness of Breath and Swelling of Extremities. Gastrointestinal Not Present- Abdominal Pain, Bloating, Bloody Stool, Change in Bowel Habits, Chronic diarrhea, Constipation, Difficulty Swallowing, Excessive gas, Gets full quickly at meals, Hemorrhoids, Indigestion, Nausea, Rectal Pain and Vomiting. Female Genitourinary Not Present- Frequency, Nocturia, Painful Urination, Pelvic Pain and Urgency. Musculoskeletal Not Present- Back Pain, Joint Pain, Joint Stiffness, Muscle Pain, Muscle Weakness and Swelling of Extremities. Neurological Not Present- Decreased Memory, Fainting, Headaches, Numbness, Seizures, Tingling, Tremor, Trouble walking and Weakness. Psychiatric Not Present- Anxiety, Bipolar, Change in Sleep Pattern, Depression, Fearful and Frequent crying. Endocrine Not Present- Cold Intolerance, Excessive Hunger, Hair Changes, Heat Intolerance, Hot flashes and New Diabetes. Hematology Not Present- Blood Thinners, Easy Bruising, Excessive bleeding, Gland problems, HIV and Persistent Infections.  Vitals (Tanisha A. Brown RMA; 05/03/2017 9:10 AM) 05/03/2017 9:10 AM Weight: 159.6 lb Height: 65in Body Surface Area: 1.8 m Body Mass Index: 26.56 kg/m  Temp.: 98.84F  Pulse: 89 (Regular)  BP: 116/68 (Sitting, Left Arm, Standard) BP today 138/63      Physical Exam (Mahaley Schwering O. Melissa Spruce MD; 05/03/2017 9:40 AM) General Mental  Status-Alert. General Appearance-Cooperative and Well groomed. Note: Seems younger than stated age Orientation-Oriented X4. Build & Nutrition-Well nourished and Well developed.  Chest and Lung Exam Chest and lung exam reveals -normal excursion  with symmetric chest walls, quiet, even and easy respiratory effort with no use of accessory muscles and on auscultation, normal breath sounds, no adventitious sounds and normal vocal resonance.  Cardiovascular Cardiovascular examination reveals -on palpation PMI is normal in location and amplitude, no palpable S3 or S4. Normal cardiac borders., normal heart sounds, regular rate and rhythm with no murmurs and (see Vital Signs section for blood pressure measurements). Note: No murmurs  Abdomen:  Unchanged Inspection Inspection of the abdomen reveals - No Visible peristalsis and No Hernias. Palpation/Percussion Tenderness - Epigastrium(Very mild tenderness). Auscultation Auscultation of the abdomen reveals - Bowel sounds normal.    Assessment & Plan Fayrene Fearing(Kauri Garson O. Rama Sorci MD; 05/03/2017 9:41 AM) GALLBLADDER POLYP (K82.4) Impression: Most recent US demonstrated a GB polyp near the neckof the GB. Patient symptoms are vague and really did not come about until after she knew about the GB polyp. No fevers or chill. Occasiona nausea with certain food.  Recommend cholecystectomy wtihout IOC Current Plans: Lap cholecystectomy for gallbladder polyp.  Melissa LamasJames O. Gae BonWyatt, III, MD, FACS [phone]--pager [phone]--office [facility]

## 2017-06-07 NOTE — Anesthesia Procedure Notes (Signed)
Procedure Name: Intubation Date/Time: 06/07/2017 7:21 AM Performed by: Adria Dillonkin, Maurica Omura A, CRNA Pre-anesthesia Checklist: Patient identified, Emergency Drugs available, Suction available and Patient being monitored Patient Re-evaluated:Patient Re-evaluated prior to induction Oxygen Delivery Method: Circle system utilized Preoxygenation: Pre-oxygenation with 100% oxygen Induction Type: IV induction Ventilation: Mask ventilation without difficulty Laryngoscope Size: 2 Grade View: Grade I Tube type: Oral Tube size: 7.0 mm Number of attempts: 1 Airway Equipment and Method: Stylet Placement Confirmation: ETT inserted through vocal cords under direct vision,  positive ETCO2,  CO2 detector and breath sounds checked- equal and bilateral Secured at: 21 cm Tube secured with: Tape Dental Injury: Teeth and Oropharynx as per pre-operative assessment

## 2017-06-07 NOTE — Op Note (Signed)
OPERATIVE REPORT  DATE OF OPERATION: 06/07/2017  PATIENT:  Melissa Roth  57 y.o. female  PRE-OPERATIVE DIAGNOSIS:  Gallbladder polyp  POST-OPERATIVE DIAGNOSIS:  Gallbladder polyp, left hepatic cyst  INDICATION(S) FOR OPERATION:  Work up demonstrated gallbladder polyp  FINDINGS:  Multiple cysts of the gallbladder  PROCEDURE:  Procedure(s): LAPAROSCOPIC CHOLECYSTECTOMY  SURGEON:  Surgeon(s): Jimmye Norman, MD  ASSISTANT: RNFA  ANESTHESIA:   general  COMPLICATIONS:  1.0 cm serosal tear of the distal stomach  EBL: <10 ml  BLOOD ADMINISTERED: none  DRAINS: none   SPECIMEN:  Source of Specimen:  Gallbladder and contents  COUNTS CORRECT:  YES  PROCEDURE DETAILS: The patient was taken to the operating room and placed on the table in the supine position.  After an adequate endotracheal anesthetic was administered, the patient was prepped with ChloroPrep, and then draped in the usual manner exposing the entire abdomen laterally, inferiorly and up  to the costal margins.  After a proper timeout was performed including identifying the patient and the procedure to be performed, a Supraumbilical 1.5cm midline incision was made using a #15 blade.  This was taken down to the fascia which was then incised with a #15 blade.  The edges of the fascia were tented up with Kocher clamps as the preperitoneal space was penetrated with a Kelly clamp into the peritoneum.  Once this was done, a pursestring suture of 0 Vicryl was passed around the fascial opening.  This was subsequently used to secure the [facility] cannula which was passed into the peritoneal cavity.  Once the [facility] cannula was in place, carbon dioxide gas was insufflated into the peritoneal cavity up to a maximal intra-abdominal pressure of 15mm Hg.The laparoscope, with attached camera and light source, was passed into the peritoneal cavity to visualize the direct insertion of two right upper quadrant 5mm cannulas, and a sup-xiphoid 5mm  cannula.  Once all cannulas were in place, the dissection was begun.  There were adhesions of the gallbladder infundibulum to the outer portion of the duodenum and distal stomach that required dissection   During the course o this dissection tere was a < 1.0cm serosal trear of the distal stomac that was not full thickness and did not require repair.  It was watched during the case and there was not leakage or spillage.    Two ratcheted graspers were attached to the dome and infundibulum of the gallbladder and retracted towards the anterior abdominal wall and the right upper quadrant.  Using cautery attached to a dissecting forceps, the peritoneum overlaying the triangle of Chalot and the hepatoduodenal triangle was dissected away exposing the cystic duct and the cystic artery.  A critical window was developed between the CBD and the cystic duct The cystic artery was clipped proximally and distally then transected.  A clip was placed on the gallbladder side of the cystic duct, then the distal cystic duct was clipped three times then transected between the clips.  The gallbladder was then dissected out of the hepatic bed without event.  It was retrieved from the abdomen without event.  Once the gallbladder was removed, the bed was inspected for hemostasis.  Once excellent hemostasis was obtained all gas and fluids were aspirated from above the liver, then the cannulas were removed.  The supraumbilical incision was closed using the pursestring suture which was in place.  0.25% bupivicaine with epinephrine was injected at all sites.  All 10mm or greater cannula sites were close using a running subcuticular stitch of  4-0 Monocryl.  5.570mm cannula sites were closed with Dermabond only.Steri-Strips and Tagaderm were used to complete the dressings at all sites.  At this point all needle, sponge, and instrument counts were correct.The patient was awakened from anesthesia and taken to the PACU in stable  condition.  Marta LamasJames O. Gae BonWyatt, III, MD, FACS [phone]--pager [phone]--office [facility]  PATIENT DISPOSITION:  PACU - hemodynamically stable.   Jimmye NormanJames Damek Ende 6/4/20198:15 AM

## 2017-06-08 ENCOUNTER — Encounter (HOSPITAL_COMMUNITY): Payer: Self-pay | Admitting: General Surgery

## 2017-06-08 NOTE — Anesthesia Postprocedure Evaluation (Signed)
Anesthesia Post Note  Patient: Melissa Roth  Procedure(s) Performed: LAPAROSCOPIC CHOLECYSTECTOMY (N/A Abdomen)     Patient location during evaluation: PACU Anesthesia Type: General Level of consciousness: awake and alert Pain management: pain level controlled Vital Signs Assessment: post-procedure vital signs reviewed and stable Respiratory status: spontaneous breathing, nonlabored ventilation, respiratory function stable and patient connected to nasal cannula oxygen Cardiovascular status: blood pressure returned to baseline and stable Postop Assessment: no apparent nausea or vomiting Anesthetic complications: no    Last Vitals:  Vitals:   06/07/17 0839 06/07/17 0849  BP: 115/67 136/77  Pulse: 73 (!) 57  Resp: 15   Temp:    SpO2: 99% 100%    Last Pain:  Vitals:   06/07/17 0839  TempSrc:   PainSc: 0-No pain                 Canton Yearby S

## 2017-08-19 ENCOUNTER — Encounter: Payer: Self-pay | Admitting: Podiatry

## 2017-08-19 ENCOUNTER — Ambulatory Visit (INDEPENDENT_AMBULATORY_CARE_PROVIDER_SITE_OTHER): Payer: 59 | Admitting: Podiatry

## 2017-08-19 ENCOUNTER — Ambulatory Visit (INDEPENDENT_AMBULATORY_CARE_PROVIDER_SITE_OTHER): Payer: 59

## 2017-08-19 DIAGNOSIS — S92502A Displaced unspecified fracture of left lesser toe(s), initial encounter for closed fracture: Secondary | ICD-10-CM | POA: Diagnosis not present

## 2017-08-19 DIAGNOSIS — R609 Edema, unspecified: Secondary | ICD-10-CM

## 2017-08-19 NOTE — Patient Instructions (Signed)
Toe Fracture A toe fracture is a break in one of the toe bones (phalanges). Follow these instructions at home: If you have a cast:  Do not stick anything inside the cast to scratch your skin.  Check the skin around the cast every day. Tell your doctor about any concerns. Do not put lotion on the skin underneath the cast. You may put lotion on dry skin around the edges of the cast.  Do not put pressure on any part of the cast until it is fully hardened. This may take many hours.  Keep the cast clean and dry. Bathing  Do not take baths, swim, or use a hot tub until your doctor says that you can. Ask your doctor if you can take showers. You may only be allowed to take sponge baths for bathing.  If your doctor says that bathing and showering are okay, cover the cast or bandage (dressing) with a watertight plastic bag to protect it from water. Do not let the cast or bandage get wet. Managing pain, stiffness, and swelling  If you do not have a cast, put ice on the injured area if told by your doctor: ? Put ice in a plastic bag. ? Place a towel between your skin and the bag. ? Leave the ice on for 20 minutes, 2-3 times per day.  Move your toes often to avoid stiffness and to lessen swelling.  Raise (elevate) the injured area above the level of your heart while you are sitting or lying down. Driving  Do not drive or use heavy machinery while taking pain medicine.  Do not drive while wearing a cast on a foot that you use for driving. Activity  Return to your normal activities as told by your doctor. Ask your doctor what activities are safe for you.  Perform exercises daily as told by your doctor or therapist. Safety  Do not use your leg to support your body weight until your doctor says that you can. Use crutches or other tools to help you move around as told by your doctor. General instructions  If your toe was taped to a toe that is next to it (buddy taping), follow your doctor's  instructions for changing the gauze and tape. Change it more often: ? If the gauze and tape get wet. If this happens, dry the space between the toes. ? If the gauze and tape are too tight and they cause your toe to become pale or to lose feeling (numb).  Wear a protective shoe as told by your doctor. If you were not given one, wear sturdy shoes that support your foot. Your shoes should not pinch your toes. Your shoes should not fit tightly against your toes.  Do not use any tobacco products, including cigarettes, chewing tobacco, or e-cigarettes. Tobacco can delay bone healing. If you need help quitting, ask your doctor.  Take medicines only as told by your doctor.  Keep all follow-up visits as told by your doctor. This is important. Contact a doctor if:  You have a fever.  Your pain medicine is not helping.  Your toe feels cold.  You lose feeling (have numbness) in your toe.  You still have pain after one week of rest and treatment.  You still have pain after your doctor has said that you can start walking again.  You have pain or tingling in your foot, and it is not going away.  You have loss of feeling in your foot, and it is   not going away. Get help right away if:  You have severe pain.  You have redness or swelling (inflammation) in your toe, and it is getting worse.  You have pain or loss of feeling in your toe, and it is getting worse.  Your toe is blue. This information is not intended to replace advice given to you by your health care provider. Make sure you discuss any questions you have with your health care provider. Document Released: 06/09/2007 Document Revised: 08/25/2015 Document Reviewed: 10/17/2013 Elsevier Interactive Patient Education  2018 [employer]  

## 2017-08-23 DIAGNOSIS — S92502A Displaced unspecified fracture of left lesser toe(s), initial encounter for closed fracture: Secondary | ICD-10-CM | POA: Insufficient documentation

## 2017-08-23 NOTE — Progress Notes (Signed)
Subjective: 57 year old female presents the office today for concerns of swelling and pain in the left second toe which happened 2 weeks ago after she fell in the rain.  She states that she also had recent back surgery so she went to orthopedic doctor at that point she had a lot of pain to her foot.  She had no treatment to her foot.  She states the pain is better but does continue to some degree.  She said no recent treatment for her foot.  No other concerns. Denies any systemic complaints such as fevers, chills, nausea, vomiting. No acute changes since last appointment, and no other complaints at this time.   Objective: AAO x3, NAD DP/PT pulses palpable bilaterally, CRT less than 3 seconds There is tenderness of the left second toe on the PIPJ area there is mild edema of the toe but there is no open lesions there is no erythema increased warmth.  No pain with MPJ range of motion there is no area of pain on the metatarsals.  No other areas of tenderness bilaterally there is no other areas of edema, erythema. No open lesions or pre-ulcerative lesions.  No pain with calf compression, swelling, warmth, erythema  Assessment: Left second toe fracture  Plan: -All treatment options discussed with the patient including all alternatives, risks, complications.  -X-rays were obtained reviewed which should reveal a fracture of the proximal phalanx.  No significant callus formation is present.  No evidence of acute fracture. -Given the continuation of pain and swelling I did level is in the surgical shoe.  On her continue ice elevate and limit activity.  As her pain improves she can start to transition to a stiffer soled shoe and gradually increase activity level. -Patient encouraged to call the office with any questions, concerns, change in symptoms.   Vivi BarrackMatthew R Wagoner DPM

## 2017-09-12 ENCOUNTER — Ambulatory Visit (INDEPENDENT_AMBULATORY_CARE_PROVIDER_SITE_OTHER): Payer: 59 | Admitting: Podiatry

## 2017-09-12 ENCOUNTER — Ambulatory Visit (INDEPENDENT_AMBULATORY_CARE_PROVIDER_SITE_OTHER): Payer: 59

## 2017-09-12 ENCOUNTER — Encounter: Payer: Self-pay | Admitting: Podiatry

## 2017-09-12 DIAGNOSIS — S92502A Displaced unspecified fracture of left lesser toe(s), initial encounter for closed fracture: Secondary | ICD-10-CM

## 2017-09-12 DIAGNOSIS — S92502D Displaced unspecified fracture of left lesser toe(s), subsequent encounter for fracture with routine healing: Secondary | ICD-10-CM | POA: Diagnosis not present

## 2017-09-12 NOTE — Progress Notes (Signed)
Subjective: 57 year old female presents the office today for follow-up evaluation of fracture to the left second toe.  She has been in the surgical shoe the majority of time except for when she went to [facility] for a family reunion she can go back to regular shoe.  She states that she still has some discomfort of the toe. Denies any systemic complaints such as fevers, chills, nausea, vomiting. No acute changes since last appointment, and no other complaints at this time.   Objective: AAO x3, NAD DP/PT pulses palpable bilaterally, CRT less than 3 seconds Mild tenderness continues to the PIPJ and the toe has a mild contracture of the PIPJ.  Some discomfort submetatarsal 2 area but there is no evidence of pinpoint tenderness on the second metatarsal.  No other areas of tenderness. No open lesions or pre-ulcerative lesions.  No pain with calf compression, swelling, warmth, erythema  Assessment: 57 year old female left second toe fracture  Plan: -All treatment options discussed with the patient including all alternatives, risks, complications.  -X-rays were obtained reviewed.  Callus formation starting along the second toe proximal phalanx.  Mild displacement. -At this point will recommend continue in surgical shoe wear a stiffer soled shoe.  As her pain improves she can start to transition to a regular shoe.  Ultimately discussed that if the toe causes any issues she may need to have an arthroplasty performed of the toe long term.  -Patient encouraged to call the office with any questions, concerns, change in symptoms.   [name] DPM

## 2017-10-03 DIAGNOSIS — G894 Chronic pain syndrome: Secondary | ICD-10-CM | POA: Insufficient documentation

## 2017-10-10 ENCOUNTER — Ambulatory Visit (INDEPENDENT_AMBULATORY_CARE_PROVIDER_SITE_OTHER): Payer: 59 | Admitting: Podiatry

## 2017-10-10 ENCOUNTER — Other Ambulatory Visit: Payer: Self-pay | Admitting: Family Medicine

## 2017-10-10 ENCOUNTER — Ambulatory Visit (INDEPENDENT_AMBULATORY_CARE_PROVIDER_SITE_OTHER): Payer: 59

## 2017-10-10 DIAGNOSIS — S92502D Displaced unspecified fracture of left lesser toe(s), subsequent encounter for fracture with routine healing: Secondary | ICD-10-CM

## 2017-10-10 DIAGNOSIS — Z1231 Encounter for screening mammogram for malignant neoplasm of breast: Secondary | ICD-10-CM

## 2017-10-10 NOTE — Progress Notes (Signed)
Subjective: 57 year old female presents the office today for follow-up evaluation of fracture to the left second toe.  She states overall she is doing better but the toe does not lay flat.  She is back to wearing a sandal and a regular shoe with minimal discomfort.  She is concerned as she gets back into a high heel or closed in shoe construct or rub.  She has not yet done this.  She has no other concerns today. Denies any systemic complaints such as fevers, chills, nausea, vomiting. No acute changes since last appointment, and no other complaints at this time.   Objective: AAO x3, NAD DP/PT pulses palpable bilaterally, CRT less than 3 seconds Minimal tenderness continues to the PIPJ and the toe has a mild contracture of the PIPJ.  Discomfort along the second MPJ but there is no area pinpoint tenderness on the metatarsal or pain with MPJ range of motion.  Minimal swelling submetatarsal 2.  No other areas of tenderness. No open lesions or pre-ulcerative lesions.  No pain with calf compression, swelling, warmth, erythema  Assessment: 57 year old female left second toe fracture  Plan: -All treatment options discussed with the patient including all alternatives, risks, complications.  -X-rays were obtained reviewed.  Callus formation starting along the second toe proximal phalanx.  Mild displacement.  At this point the toe has healed and is becoming rigid the PIPJ.  We discussed her symptoms continue we discussed angular arthroplasty and arthrodesis of the PIPJ.  We will try to see how she does and start regular shoes but if she continues to have symptoms we discussed surgical intervention.  We will see her back in about 4 to 6 weeks to see if she is doing or sooner if any issues are to arise.  She agrees this plan is no further questions.  [name] DPM

## 2017-11-14 ENCOUNTER — Encounter: Payer: Self-pay | Admitting: Podiatry

## 2017-11-14 ENCOUNTER — Ambulatory Visit (INDEPENDENT_AMBULATORY_CARE_PROVIDER_SITE_OTHER): Payer: 59 | Admitting: Podiatry

## 2017-11-14 ENCOUNTER — Ambulatory Visit (INDEPENDENT_AMBULATORY_CARE_PROVIDER_SITE_OTHER): Payer: 59

## 2017-11-14 VITALS — BP 111/69 | HR 68

## 2017-11-14 DIAGNOSIS — S92502D Displaced unspecified fracture of left lesser toe(s), subsequent encounter for fracture with routine healing: Secondary | ICD-10-CM | POA: Diagnosis not present

## 2017-11-14 DIAGNOSIS — M2042 Other hammer toe(s) (acquired), left foot: Secondary | ICD-10-CM | POA: Diagnosis not present

## 2017-11-15 ENCOUNTER — Telehealth: Payer: Self-pay | Admitting: *Deleted

## 2017-11-15 NOTE — Telephone Encounter (Signed)
I attempted to call the patient.  I left her a message to call me tomorrow.  We need to change her arrival time for 01/02/18.  She will need to be here at 12 noon instead of 7:45 am.  Dr. Ardelle AntonWagoner has surgery at the surgical center in the am.

## 2017-11-16 NOTE — Progress Notes (Addendum)
Subjective: 57 year old female presents the office today for follow-up evaluation of left second toe fracture.  She states that she is having difficulty wearing certain shoes because it rubs inside of her shoes.  She states that she has been thinking about surgery at this point she wants to go ahead and consider doing this again again scheduled.  She states that she cannot bend the second toe like she can bend the other toes. Denies any systemic complaints such as fevers, chills, nausea, vomiting. No acute changes since last appointment, and no other complaints at this time.   Objective: AAO x3, NAD DP/PT pulses palpable bilaterally, CRT less than 3 seconds There is absent range of motion of the second PIPJ in the toe sitting in a contracted position.  Is tenderness palpation of the dorsal aspect of the right second PIPJ.  No other areas of tenderness.  There is slight erythema, rubs inside shoes. No open lesions or pre-ulcerative lesions.  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  -I reviewed the x-rays with her.  We discussed both conservative as well as surgical treatment options.  Given the pain on the PIPJ and lack of motion we discussed an arthroplasty of the left second toe.  Discussed the surgery as well as postoperative course and she wishes to proceed with surgery.  She wants to do this under local anesthesia in the office. -The incision placement as well as the postoperative course was discussed with the patient. I discussed risks of the surgery which include, but not limited to, infection, bleeding, pain, swelling, need for further surgery, delayed or nonhealing, painful or ugly scar, numbness or sensation changes, over/under correction, recurrence, transfer lesions, further deformity, hardware failure,  DVT/PE, loss of toe/foot. Patient understands these risks and wishes to proceed with surgery. The surgical consent was reviewed with the patient all 3 pages were signed. No promises or guarantees were given to the outcome of the procedure. All questions were answered to the best of my ability. Before the surgery the patient was encouraged to call the office if there is any further questions. The surgery will be performed in the office under local anesthesia on an outpatient basis. -Patient encouraged to call the office with any questions, concerns, change in symptoms.   Vivi BarrackMatthew R Azhane Eckart DPM

## 2017-11-16 NOTE — Telephone Encounter (Signed)
I left the patient a message that we were changing her appointment time on December 30 to 12 noon instead of 7:45 am. I asked her to call if she has any concerns.

## 2017-11-24 ENCOUNTER — Telehealth: Payer: Self-pay | Admitting: *Deleted

## 2017-11-24 NOTE — Telephone Encounter (Signed)
"  I'm calling back in regards to my surgery appointment that's scheduled for there on the 30th.  If you could give me a call back.  I did get your message about having to move the surgery to 12 noon.  Thank you."

## 2017-11-25 NOTE — Telephone Encounter (Signed)
I am returning your call.  "You had called me to reschedule my surgery?"  Yes, I was only calling to change the arrival time, the date is the same, December 30.  "Okay, I just wanted to make sure.  You all are going to check my insurance prior to the surgery date correct?"  Yes, we will check your insurance before your surgery date.

## 2017-12-20 ENCOUNTER — Telehealth: Payer: Self-pay | Admitting: *Deleted

## 2017-12-20 NOTE — Telephone Encounter (Signed)
I left the patient a message to give me a call back.  I informed her that we need to change her time from 12 pm on December 30 to 4 pm.

## 2017-12-22 NOTE — Telephone Encounter (Signed)
I am calling to see if you got my message about changing your time to 4 pm on December 30.  "Yes, I did.  Is he doing a lot of surgeries that morning or something?"  Yes, he is.  As a matter of fact, he was asked by one of the other doctors to assist him with a surgery that's going to be three hours long.  "Okay, I guess I have no choice.  I'll be there."  Thank you so much.

## 2017-12-26 ENCOUNTER — Encounter: Payer: Self-pay | Admitting: Podiatry

## 2017-12-26 ENCOUNTER — Ambulatory Visit (INDEPENDENT_AMBULATORY_CARE_PROVIDER_SITE_OTHER): Payer: 59 | Admitting: Podiatry

## 2017-12-26 DIAGNOSIS — M7752 Other enthesopathy of left foot: Secondary | ICD-10-CM | POA: Diagnosis not present

## 2017-12-26 MED ORDER — MELOXICAM 15 MG PO TABS: 15.0000 mg | ORAL_TABLET | Freq: Every day | ORAL | 0 refills | Status: DC

## 2017-12-29 ENCOUNTER — Ambulatory Visit
Admission: RE | Admit: 2017-12-29 | Discharge: 2017-12-29 | Disposition: A | Discharge status: Home / Self Care | Payer: 59 | Source: Ambulatory Visit | Attending: Family Medicine | Admitting: Family Medicine

## 2017-12-29 DIAGNOSIS — Z1231 Encounter for screening mammogram for malignant neoplasm of breast: Secondary | ICD-10-CM

## 2017-12-30 ENCOUNTER — Telehealth: Payer: Self-pay | Admitting: *Deleted

## 2017-12-30 NOTE — Telephone Encounter (Signed)
I called and left her a message that we have her scheduled for Monday and that someone from the facility will call her from the facility and give her the arrival time.

## 2017-12-30 NOTE — Telephone Encounter (Signed)
"  I'm calling to see if Dr. Ardelle AntonWagoner could get me in to have surgery on the 30th.  He had mentioned trying to get me in at 7 am that morning."  I'll have to call you back with that information.

## 2018-01-02 ENCOUNTER — Other Ambulatory Visit: Payer: Self-pay | Admitting: Podiatry

## 2018-01-02 ENCOUNTER — Encounter: Payer: Self-pay | Admitting: Podiatry

## 2018-01-02 ENCOUNTER — Ambulatory Visit: Payer: 59 | Admitting: Podiatry

## 2018-01-02 DIAGNOSIS — M2042 Other hammer toe(s) (acquired), left foot: Secondary | ICD-10-CM | POA: Diagnosis not present

## 2018-01-02 MED ORDER — CEPHALEXIN 500 MG PO CAPS: 500.0000 mg | ORAL_CAPSULE | Freq: Three times a day (TID) | ORAL | 0 refills | Status: DC

## 2018-01-02 MED ORDER — PROMETHAZINE HCL 25 MG PO TABS: 25.0000 mg | ORAL_TABLET | Freq: Three times a day (TID) | ORAL | 0 refills | Status: DC | PRN

## 2018-01-02 MED ORDER — HYDROCODONE-ACETAMINOPHEN 5-325 MG PO TABS: 1.0000 | ORAL_TABLET | Freq: Four times a day (QID) | ORAL | 0 refills | Status: AC | PRN

## 2018-01-02 NOTE — Progress Notes (Signed)
Pre-operative Note  [facility] today for surgical intervention of the left foot for 2nd digit hammertoe repair. The surgical consent was reviewed with the patient and we discussed the procedure as well as the postoperative course. I again discussed all alternatives, risks, complications. I answered all of their questions to the best of my ability and they wish to proceed with surgery. No promises or guarantees were given as to the outcome of the surgery.   The surgical consent was signed.   Patient is NPO since midnight.  The patient does not have have a history of blood clots or bleeding disorders.   Postop medications sent to her pharmacy.   No further questions.   Ovid CurdMatthew Wagoner, DPM [facility]

## 2018-01-05 NOTE — Progress Notes (Signed)
Subjective: Thurston Hole presents the office today for new concerns of pain to the big toe.  She points on the hallux IPJ where she gets discomfort.  She denies any recent injury or trauma or any swelling but she has noted some discomfort in this area and she wants have the area checked before she has surgery of her second toe next week.  She said no recent treatment for this.  Is been intermittently uncomfortable. Denies any systemic complaints such as fevers, chills, nausea, vomiting. No acute changes since last appointment, and no other complaints at this time.   Objective: AAO x3, NAD DP/PT pulses palpable bilaterally, CRT less than 3 seconds The left second toe exam is unchanged.  However today there is some discomfort on the hallux IPJ there is no pain with IPJ range of motion.  There is no edema, erythema.  Minimal discomfort of the dorsal aspect the joint.  Mild bunion deformity is also present but this is without discomfort. No open lesions or pre-ulcerative lesions.  No pain with calf compression, swelling, warmth, erythema  Assessment: Capsulitis right hallux  Plan: -All treatment options discussed with the patient including all alternatives, risks, complications.  -Again this is more from compensation.  We discussed changing shoes as well as offloading wearing stiffer soled shoe.  Anti-inflammatories as needed.  The bunion is not causing discomfort and is localized to the toe itself.  We will continue to monitor. -Patient encouraged to call the office with any questions, concerns, change in symptoms.   [name] DPM

## 2018-01-09 ENCOUNTER — Ambulatory Visit (INDEPENDENT_AMBULATORY_CARE_PROVIDER_SITE_OTHER): Payer: Self-pay | Admitting: Podiatry

## 2018-01-09 ENCOUNTER — Ambulatory Visit (INDEPENDENT_AMBULATORY_CARE_PROVIDER_SITE_OTHER): Payer: 59

## 2018-01-09 DIAGNOSIS — M7752 Other enthesopathy of left foot: Secondary | ICD-10-CM

## 2018-01-09 DIAGNOSIS — M2032 Hallux varus (acquired), left foot: Secondary | ICD-10-CM

## 2018-01-12 ENCOUNTER — Other Ambulatory Visit: Payer: 59

## 2018-01-16 ENCOUNTER — Ambulatory Visit (INDEPENDENT_AMBULATORY_CARE_PROVIDER_SITE_OTHER): Payer: 59 | Admitting: Podiatry

## 2018-01-16 DIAGNOSIS — M7752 Other enthesopathy of left foot: Secondary | ICD-10-CM

## 2018-01-16 DIAGNOSIS — M2042 Other hammer toe(s) (acquired), left foot: Secondary | ICD-10-CM

## 2018-01-16 NOTE — Progress Notes (Signed)
Subjective: Melissa Roth is a 58 y.o. is seen today in office s/p left second digit hammertoe repair preformed on 01/02/2018.  Overall she states that she is doing well.  She has no discomfort the first day or so but otherwise she is been doing well.  She has been in the surgical shoe.  Denies any systemic complaints such as fevers, chills, nausea, vomiting. No calf pain, chest pain, shortness of breath.   Objective: General: No acute distress, AAOx3  DP/PT pulses palpable 2/4, CRT < 3 sec to all digits.  Protective sensation intact. Motor function intact.  LEFT foot: Incision is well coapted without any evidence of dehiscence and sutures appear to be intact. There is no surrounding erythema, ascending cellulitis, fluctuance, crepitus, malodor, drainage/purulence. There is minimal edema around the surgical site. There is no pain along the surgical site.  She has had some pain submetatarsal 2 area prior to surgery however when she palpates the area now she is no longer extends no discomfort. No other areas of tenderness to bilateral lower extremities.  No other open lesions or pre-ulcerative lesions.  No pain with calf compression, swelling, warmth, erythema.   Assessment and Plan:  Status post left second digit hammertoe repair, doing well with no complications   -Treatment options discussed including all alternatives, risks, and complications -X-rays were obtained and reviewed.  Status post arthroplasty of the right second toe.  Evidence of acute fracture. -Incision appears to be healing well.  Antibiotic ointment and a bandage was applied.  Keep the dressing clean, dry, intact.  If she states she continues to drains every couple of days with a similar dressing but hold off on getting the area wet. -Ice/elevation -Pain medication as needed. -Monitor for any clinical signs or symptoms of infection and DVT/PE and directed to call the office immediately should any occur or go to the  ER. -Follow-up as scheduled for suture removal or sooner if any problems arise. In the meantime, encouraged to call the office with any questions, concerns, change in symptoms.   Ovid Curd, DPM

## 2018-01-22 NOTE — Progress Notes (Signed)
Subjective: Melissa Roth is a 58 y.o. is seen today in office s/p left second digit hammertoe repair preformed on 01/02/2018.  Overall she states that she is doing well. She presents today for suture removal.  Overall she is been doing well.  She is been in the surgical shoe.  Denies any pain where she is been taking pain medicine. Denies any systemic complaints such as fevers, chills, nausea, vomiting. No calf pain, chest pain, shortness of breath.   Objective: General: No acute distress, AAOx3  DP/PT pulses palpable 2/4, CRT < 3 sec to all digits.  Protective sensation intact. Motor function intact.  LEFT foot: Incision is well coapted without any evidence of dehiscence and sutures appear to be intact. There is decreased edema to the area there is no erythema or increase in warmth. The toe is in a rectus position. No pain to the toe. The incision appears to be healing well without any signs of infection or dehiscence.  No other open lesions or pre-ulcerative lesions.  No pain with calf compression, swelling, warmth, erythema.   Assessment and Plan:  Status post left second digit hammertoe repair, doing well with no complications   -Treatment options discussed including all alternatives, risks, and complications -I removed The sutures today as there is some minimal motion across the incision.  Antibiotic ointment and a bandage was applied.  Keep the dressing clean, dry, intact.  May need surgical shoe.  Continue ice elevate.  I will see her back in 1 week and at that point remove the remainder of the sutures.  Continue to monitor any signs or symptoms of infection.  [name] DPM

## 2018-01-23 ENCOUNTER — Ambulatory Visit (INDEPENDENT_AMBULATORY_CARE_PROVIDER_SITE_OTHER): Payer: Self-pay | Admitting: Podiatry

## 2018-01-23 DIAGNOSIS — M7752 Other enthesopathy of left foot: Secondary | ICD-10-CM

## 2018-01-23 DIAGNOSIS — M2042 Other hammer toe(s) (acquired), left foot: Secondary | ICD-10-CM

## 2018-01-30 NOTE — Progress Notes (Signed)
Subjective: Melissa Roth is a 58 y.o. is seen today in office s/p left second digit hammertoe repair preformed on 01/02/2018. She presents today for suture removal. Overall she states that she is doing well and not having any pain. She is been in the surgical shoe. She is not taking any pain medications. Denies any systemic complaints such as fevers, chills, nausea, vomiting. No calf pain, chest pain, shortness of breath.   Objective: General: No acute distress, AAOx3  DP/PT pulses palpable 2/4, CRT < 3 sec to all digits.  Protective sensation intact. Motor function intact.  LEFT foot: Incision is well coapted without any evidence of dehiscence and sutures appear to be intact. There is mild edema to the area there is no erythema or increase in warmth. The toe is in a rectus position. No pain to the toe. The incision appears to be healing well without any signs of infection or dehiscence. No pain on the submetatarsal area where she was also having pain previously.  No other open lesions or pre-ulcerative lesions.  No pain with calf compression, swelling, warmth, erythema.   Assessment and Plan:  Status post left second digit hammertoe repair, doing well with no complications   -Treatment options discussed including all alternatives, risks, and complications -I removed the sutures today without any issues and the incision remained well coapted.  Antibiotic ointment and a bandage was applied. She can start to shower tomorrow. Remain in the surgical shoe for a total of 4 weeks postop then she can start to transition to a regular shoe. Continue to monitor any signs or symptoms of infection.  [name] DPM

## 2018-02-07 DIAGNOSIS — M5412 Radiculopathy, cervical region: Secondary | ICD-10-CM | POA: Insufficient documentation

## 2018-02-13 ENCOUNTER — Ambulatory Visit (INDEPENDENT_AMBULATORY_CARE_PROVIDER_SITE_OTHER): Payer: 59 | Admitting: Podiatry

## 2018-02-13 DIAGNOSIS — M2032 Hallux varus (acquired), left foot: Secondary | ICD-10-CM

## 2018-02-13 DIAGNOSIS — M2042 Other hammer toe(s) (acquired), left foot: Secondary | ICD-10-CM

## 2018-02-13 DIAGNOSIS — M722 Plantar fascial fibromatosis: Secondary | ICD-10-CM

## 2018-02-13 MED ORDER — DICLOFENAC SODIUM 75 MG PO TBEC: 75.0000 mg | DELAYED_RELEASE_TABLET | Freq: Two times a day (BID) | ORAL | 0 refills | Status: DC

## 2018-02-13 NOTE — Progress Notes (Signed)
Subjective: Melissa Roth is a 58 y.o. is seen today in office s/p left second digit hammertoe repair preformed on 01/02/2018.  Overall she states that she is doing better from a surgical standpoint.  She is back to wearing regular shoes without any significant discomfort.  She has an occasional discomfort at times but overall is improving.  She said that she still getting some pain on the big toe and she points on the hallux IPJ.  She states is more of a nagging pain after being on her feet some.  She said pain started to come back after go back to regular shoe.  She has a history of wearing orthotics several years ago she had a history of plantar fasciitis. Denies any systemic complaints such as fevers, chills, nausea, vomiting. No calf pain, chest pain, shortness of breath.   Objective: General: No acute distress, AAOx3  DP/PT pulses palpable 2/4, CRT < 3 sec to all digits.  Protective sensation intact. Motor function intact.  LEFT foot: Incision is well coapted without any evidence of dehiscence and scar is well formed.  There is decreased edema to the toe there is no erythema increased warmth.  There is no tenderness palpation of the second toe and second MPJ.  There is subjective subjective discomfort on the hallux IPJ there is no pain or restriction with hallux IPJ range of motion.  There is decreased range of motion of the first MPJ.  There is a decrease in medial arch upon weightbearing.  No other areas of tenderness.  No other open lesions or pre-ulcerative lesions.  No pain with calf compression, swelling, warmth, erythema.   Assessment and Plan:  Status post left second digit hammertoe repair, doing well with no complications; right hallux capsulitis  -Treatment options discussed including all alternatives, risks, and complications -Nonsurgical standpoint she is doing well.  She can continue with supportive shoes. -Regards to her other issues I want her to wear stiffer soled shoe.  She  is very flexible.  I dispensed a graphite insert away.  Long-term with all of her issues I think she will benefit from a new orthotic. Rick measured her for orthotics (check orthotic coverage from her insurance before ordering.  [name] DPM

## 2018-02-15 ENCOUNTER — Telehealth: Payer: Self-pay | Admitting: Podiatry

## 2018-02-15 NOTE — Telephone Encounter (Signed)
Pt asked Raiford Noble to verify benefits for orthotics  before doing any at the appt on 2.10.2020.  I have verified and they are excluded from pts plan. Pt is aware and is not wanting to proceed at this time.

## 2018-03-06 ENCOUNTER — Encounter: Payer: 59 | Admitting: Podiatry

## 2018-03-20 ENCOUNTER — Other Ambulatory Visit: Payer: 59 | Admitting: Orthotics

## 2018-05-22 ENCOUNTER — Other Ambulatory Visit: Payer: Self-pay

## 2018-05-22 ENCOUNTER — Ambulatory Visit (INDEPENDENT_AMBULATORY_CARE_PROVIDER_SITE_OTHER): Payer: 59

## 2018-05-22 ENCOUNTER — Encounter: Payer: Self-pay | Admitting: Podiatry

## 2018-05-22 ENCOUNTER — Ambulatory Visit (INDEPENDENT_AMBULATORY_CARE_PROVIDER_SITE_OTHER): Payer: 59 | Admitting: Podiatry

## 2018-05-22 VITALS — Temp 97.5°F

## 2018-05-22 DIAGNOSIS — M2042 Other hammer toe(s) (acquired), left foot: Secondary | ICD-10-CM

## 2018-05-22 DIAGNOSIS — M722 Plantar fascial fibromatosis: Secondary | ICD-10-CM | POA: Diagnosis not present

## 2018-05-22 DIAGNOSIS — M7752 Other enthesopathy of left foot: Secondary | ICD-10-CM | POA: Diagnosis not present

## 2018-05-22 DIAGNOSIS — M21619 Bunion of unspecified foot: Secondary | ICD-10-CM | POA: Diagnosis not present

## 2018-05-22 MED ORDER — MELOXICAM 15 MG PO TABS: 15.0000 mg | ORAL_TABLET | Freq: Every day | ORAL | 0 refills | Status: AC

## 2018-05-22 NOTE — Patient Instructions (Signed)
Look at getting a "Powerstep" or "Superfeet" insert   Plantar Fasciitis (Heel Spur Syndrome) with Rehab The plantar fascia is a fibrous, ligament-like, soft-tissue structure that spans the bottom of the foot. Plantar fasciitis is a condition that causes pain in the foot due to inflammation of the tissue. SYMPTOMS   Pain and tenderness on the underneath side of the foot.  Pain that worsens with standing or walking. CAUSES  Plantar fasciitis is caused by irritation and injury to the plantar fascia on the underneath side of the foot. Common mechanisms of injury include:  Direct trauma to bottom of the foot.  Damage to a small nerve that runs under the foot where the main fascia attaches to the heel bone.  Stress placed on the plantar fascia due to bone spurs. RISK INCREASES WITH:   Activities that place stress on the plantar fascia (running, jumping, pivoting, or cutting).  Poor strength and flexibility.  Improperly fitted shoes.  Tight calf muscles.  Flat feet.  Failure to warm-up properly before activity.  Obesity. PREVENTION  Warm up and stretch properly before activity.  Allow for adequate recovery between workouts.  Maintain physical fitness:  Strength, flexibility, and endurance.  Cardiovascular fitness.  Maintain a health body weight.  Avoid stress on the plantar fascia.  Wear properly fitted shoes, including arch supports for individuals who have flat feet.  PROGNOSIS  If treated properly, then the symptoms of plantar fasciitis usually resolve without surgery. However, occasionally surgery is necessary.  RELATED COMPLICATIONS   Recurrent symptoms that may result in a chronic condition.  Problems of the lower back that are caused by compensating for the injury, such as limping.  Pain or weakness of the foot during push-off following surgery.  Chronic inflammation, scarring, and partial or complete fascia tear, occurring more often from repeated  injections.  TREATMENT  Treatment initially involves the use of ice and medication to help reduce pain and inflammation. The use of strengthening and stretching exercises may help reduce pain with activity, especially stretches of the Achilles tendon. These exercises may be performed at home or with a therapist. Your caregiver may recommend that you use heel cups of arch supports to help reduce stress on the plantar fascia. Occasionally, corticosteroid injections are given to reduce inflammation. If symptoms persist for greater than 6 months despite non-surgical (conservative), then surgery may be recommended.   MEDICATION   If pain medication is necessary, then nonsteroidal anti-inflammatory medications, such as aspirin and ibuprofen, or other minor pain relievers, such as acetaminophen, are often recommended.  Do not take pain medication within 7 days before surgery.  Prescription pain relievers may be given if deemed necessary by your caregiver. Use only as directed and only as much as you need.  Corticosteroid injections may be given by your caregiver. These injections should be reserved for the most serious cases, because they may only be given a certain number of times.  HEAT AND COLD  Cold treatment (icing) relieves pain and reduces inflammation. Cold treatment should be applied for 10 to 15 minutes every 2 to 3 hours for inflammation and pain and immediately after any activity that aggravates your symptoms. Use ice packs or massage the area with a piece of ice (ice massage).  Heat treatment may be used prior to performing the stretching and strengthening activities prescribed by your caregiver, physical therapist, or athletic trainer. Use a heat pack or soak the injury in warm water.  SEEK IMMEDIATE MEDICAL CARE IF:  Treatment seems to  offer no benefit, or the condition worsens.  Any medications produce adverse side effects.  EXERCISES- RANGE OF MOTION (ROM) AND STRETCHING  EXERCISES - Plantar Fasciitis (Heel Spur Syndrome) These exercises may help you when beginning to rehabilitate your injury. Your symptoms may resolve with or without further involvement from your physician, physical therapist or athletic trainer. While completing these exercises, remember:   Restoring tissue flexibility helps normal motion to return to the joints. This allows healthier, less painful movement and activity.  An effective stretch should be held for at least 30 seconds.  A stretch should never be painful. You should only feel a gentle lengthening or release in the stretched tissue.  RANGE OF MOTION - Toe Extension, Flexion  Sit with your right / left leg crossed over your opposite knee.  Grasp your toes and gently pull them back toward the top of your foot. You should feel a stretch on the bottom of your toes and/or foot.  Hold this stretch for 10 seconds.  Now, gently pull your toes toward the bottom of your foot. You should feel a stretch on the top of your toes and or foot.  Hold this stretch for 10 seconds. Repeat  times. Complete this stretch 3 times per day.   RANGE OF MOTION - Ankle Dorsiflexion, Active Assisted  Remove shoes and sit on a chair that is preferably not on a carpeted surface.  Place right / left foot under knee. Extend your opposite leg for support.  Keeping your heel down, slide your right / left foot back toward the chair until you feel a stretch at your ankle or calf. If you do not feel a stretch, slide your bottom forward to the edge of the chair, while still keeping your heel down.  Hold this stretch for 10 seconds. Repeat 3 times. Complete this stretch 2 times per day.   STRETCH  Gastroc, Standing  Place hands on wall.  Extend right / left leg, keeping the front knee somewhat bent.  Slightly point your toes inward on your back foot.  Keeping your right / left heel on the floor and your knee straight, shift your weight toward the wall,  not allowing your back to arch.  You should feel a gentle stretch in the right / left calf. Hold this position for 10 seconds. Repeat 3 times. Complete this stretch 2 times per day.  STRETCH  Soleus, Standing  Place hands on wall.  Extend right / left leg, keeping the other knee somewhat bent.  Slightly point your toes inward on your back foot.  Keep your right / left heel on the floor, bend your back knee, and slightly shift your weight over the back leg so that you feel a gentle stretch deep in your back calf.  Hold this position for 10 seconds. Repeat 3 times. Complete this stretch 2 times per day.  STRETCH  Gastrocsoleus, Standing  Note: This exercise can place a lot of stress on your foot and ankle. Please complete this exercise only if specifically instructed by your caregiver.   Place the ball of your right / left foot on a step, keeping your other foot firmly on the same step.  Hold on to the wall or a rail for balance.  Slowly lift your other foot, allowing your body weight to press your heel down over the edge of the step.  You should feel a stretch in your right / left calf.  Hold this position for 10 seconds.  Repeat  this exercise with a slight bend in your right / left knee. Repeat 3 times. Complete this stretch 2 times per day.   STRENGTHENING EXERCISES - Plantar Fasciitis (Heel Spur Syndrome)  These exercises may help you when beginning to rehabilitate your injury. They may resolve your symptoms with or without further involvement from your physician, physical therapist or athletic trainer. While completing these exercises, remember:   Muscles can gain both the endurance and the strength needed for everyday activities through controlled exercises.  Complete these exercises as instructed by your physician, physical therapist or athletic trainer. Progress the resistance and repetitions only as guided.  STRENGTH - Towel Curls  Sit in a chair positioned on a  non-carpeted surface.  Place your foot on a towel, keeping your heel on the floor.  Pull the towel toward your heel by only curling your toes. Keep your heel on the floor. Repeat 3 times. Complete this exercise 2 times per day.  STRENGTH - Ankle Inversion  Secure one end of a rubber exercise band/tubing to a fixed object (table, pole). Loop the other end around your foot just before your toes.  Place your fists between your knees. This will focus your strengthening at your ankle.  Slowly, pull your big toe up and in, making sure the band/tubing is positioned to resist the entire motion.  Hold this position for 10 seconds.  Have your muscles resist the band/tubing as it slowly pulls your foot back to the starting position. Repeat 3 times. Complete this exercises 2 times per day.  Document Released: 12/21/2004 Document Revised: 03/15/2011 Document Reviewed: 04/04/2008 [facility] Patient Information 2014 [place], [place].

## 2018-05-23 NOTE — Progress Notes (Signed)
Subjective: 58 year old female presents the office today for concerns of pain to her left big toe joint.  She gets a sharp pain intermittently to be foot and she points on the medial first metatarsal head where she noticed the symptoms.  She denies recent injury or trauma no swelling or redness.  She is second toe is doing great having no issues.  Her other concern is she is actually getting pain to the bottom of her heels the left side worse than the right.  She has been doing more walking recently and since that she has noticed increased pain.  No recent injury or fall no swelling to the heels. Denies any systemic complaints such as fevers, chills, nausea, vomiting. No acute changes since last appointment, and no other complaints at this time.   Objective: AAO x3, NAD DP/PT pulses palpable bilaterally, CRT less than 3 seconds Mild to moderate bunion deformities present and subjectively there is tenderness on the first metatarsal head on the bunion deformity there is no tenderness identified today.  There is no pain or crepitation with MTPJ ROM.  There is no pain to the second toe. Tenderness to palpation along the plantar medial tubercle of the calcaneus at the insertion of plantar fascia on the left and right foot. There is no pain along the course of the plantar fascia within the arch of the foot. Plantar fascia appears to be intact. There is no pain with lateral compression of the calcaneus or pain with vibratory sensation. There is no pain along the course or insertion of the achilles tendon. No other areas of tenderness to bilateral lower extremities. No pain with calf compression, swelling, warmth, erythema  Assessment: Capsulitis 1st MTPJ; plantar fasciitis  Plan: -All treatment options discussed with the patient including all alternatives, risks, complications.  -X-rays were obtained and reviewed.  No evidence of acute fracture identified.  Bunion deformity is unchanged. -In regards to the  capsulitis prescription meloxicam discussed shoe modifications, orthotics as well as offloading pads.  Dispensed him today. -Regards to the heel pain steroid injection performed bilaterally.  See procedure note below.  Plantar fascial braces were dispensed x2.  Meloxicam.  Stretching, ice exercises daily.  Hold off on walking for couple of days until pain improves.  -Patient encouraged to call the office with any questions, concerns, change in symptoms.   Procedure: Injection Tendon/Ligament Discussed alternatives, risks, complications and verbal consent was obtained.  Location: Bilateral  plantar fascia at the glabrous junction; medial approach. Skin Prep: Alcohol  Injectate: 0.5cc 0.5% marcaine plain, 0.5 cc 2% lidocaine plain and, 1 cc kenalog 10. Disposition: Patient tolerated procedure well. Injection site dressed with a band-aid.  Post-injection care was discussed and return precautions discussed.   [name] DPM

## 2018-06-19 ENCOUNTER — Ambulatory Visit: Payer: 59 | Admitting: Podiatry

## 2018-06-19 ENCOUNTER — Ambulatory Visit (INDEPENDENT_AMBULATORY_CARE_PROVIDER_SITE_OTHER): Payer: 59 | Admitting: Podiatry

## 2018-06-19 ENCOUNTER — Other Ambulatory Visit: Payer: Self-pay

## 2018-06-19 ENCOUNTER — Encounter: Payer: Self-pay | Admitting: Podiatry

## 2018-06-19 DIAGNOSIS — M722 Plantar fascial fibromatosis: Secondary | ICD-10-CM

## 2018-06-19 DIAGNOSIS — M7752 Other enthesopathy of left foot: Secondary | ICD-10-CM

## 2018-06-20 NOTE — Progress Notes (Signed)
Subjective: 58 year old female presents the office today for evaluation of bilateral heel pain and plantar fasciitis as well as capsulitis of the first MPJ.  She states that overall she is doing much better.  She states that the injection heel also helped the pain to her first MPJ that she points to.  She still has some discomfort of the heels but overall improving.  She has been on her feet more the last couple of weeks because her father recently passed away.  She is scheduled to go back to work tomorrow. Denies any systemic complaints such as fevers, chills, nausea, vomiting. No acute changes since last appointment, and no other complaints at this time.   Objective: AAO x3, NAD DP/PT pulses palpable bilaterally, CRT less than 3 seconds There is improved but yet continued tenderness palpation on the plantar medial tubercle of the calcaneus at the insertion of plantar fascia with left side worse than right.  Plantar fascia appears to be intact.  No pain with lateral compression of calcaneus.  Achilles tendon intact.  No discomfort the first MPJ. Bunion deformity present. No open lesions or pre-ulcerative lesions.  No pain with calf compression, swelling, warmth, erythema  Assessment: Bilateral plantar fasciitis, MPJ capsulitis  Plan: -All treatment options discussed with the patient including all alternatives, risks, complications.  -Repeat steroid injection performed today.  See procedure note below.  Continue with stretching, icing daily.  She modifications and orthotics. -Patient encouraged to call the office with any questions, concerns, change in symptoms.   Procedure: Injection Tendon/Ligament Discussed alternatives, risks, complications and verbal consent was obtained.  Location: Bilateral plantar fascia at the glabrous junction; medial approach. Skin Prep: Alcohol  Injectate: 0.5cc 0.5% marcaine plain, 0.5 cc 2% lidocaine plain and, 1 cc kenalog 10. Disposition: Patient tolerated  procedure well. Injection site dressed with a band-aid.  Post-injection care was discussed and return precautions discussed.   Trula Slade DPM

## 2018-07-24 ENCOUNTER — Other Ambulatory Visit: Payer: Self-pay

## 2018-07-24 ENCOUNTER — Ambulatory Visit (INDEPENDENT_AMBULATORY_CARE_PROVIDER_SITE_OTHER): Payer: 59 | Admitting: Podiatry

## 2018-07-24 VITALS — Temp 97.5°F

## 2018-07-24 DIAGNOSIS — M722 Plantar fascial fibromatosis: Secondary | ICD-10-CM | POA: Diagnosis not present

## 2018-07-24 DIAGNOSIS — M779 Enthesopathy, unspecified: Secondary | ICD-10-CM

## 2018-07-24 DIAGNOSIS — M21619 Bunion of unspecified foot: Secondary | ICD-10-CM | POA: Diagnosis not present

## 2018-07-24 NOTE — Progress Notes (Signed)
Subjective: 55 presents the office for evaluation of left foot pain.  She states that overall from 3 months ago the pain level is 3/10.  However over the last couple weeks she has noticed a cramping sensation to the outside aspect of the leg.  Is mostly at nighttime.  She does use mustard as well as rubbing alcohol which does seem to eliminate her symptoms.  She was also curious if the leg pain was coming from her back because when she started having back issues she was given the pain to her leg as well.  She has not been doing stretching, icing.  She has been going to go to get purchasing new shoes. Denies any systemic complaints such as fevers, chills, nausea, vomiting. No acute changes since last appointment, and no other complaints at this time.   Objective: AAO x3, NAD DP/PT pulses palpable bilaterally, CRT less than 3 seconds At this time there is no tenderness palpation the course or insertion of the Achilles tendon, plantar fascia.  Mild to moderate bunion deformities present there is no pain.  No pain to the digits.  There is no edema, erythema.  There is no pain with calf compression, erythema or warmth.  Subjectively she is been discomfort of the lateral aspect of the leg but not able to elicit any tenderness today. No open lesions or pre-ulcerative lesions.   Assessment: Resolving left foot pain; tendonitis/plantar fasciitis  Plan: -All treatment options discussed with the patient including all alternatives, risks, complications.  -I want her to be started on some good rehab exercises at home.  He also discussed physical therapy she wants to do therapy.  Prescription for [facility].  Also discussed modifications orthotics.  I do believe that the leg muscular origin.  However she is in contact back doctor as well. -Patient encouraged to call the office with any questions, concerns, change in symptoms.   Trula Slade DPM

## 2018-07-28 ENCOUNTER — Other Ambulatory Visit: Payer: Self-pay

## 2018-07-28 ENCOUNTER — Other Ambulatory Visit: Payer: 59

## 2018-07-28 DIAGNOSIS — Z20822 Contact with and (suspected) exposure to covid-19: Secondary | ICD-10-CM

## 2018-07-31 LAB — NOVEL CORONAVIRUS, NAA: SARS-CoV-2, NAA: NOT DETECTED

## 2018-08-30 IMAGING — US US EXTREM LOW VENOUS*L*
1 series · 13 of 24 positions shown · non-contrast
Comparison: None.

CLINICAL DATA: 56-year-old female with posterior left thigh pain
for 1 day with no known injury.



[Series 1: us extrem low venous*left* · 0.07mm/px · 13 of 29 slices shown]
[im 1/29]
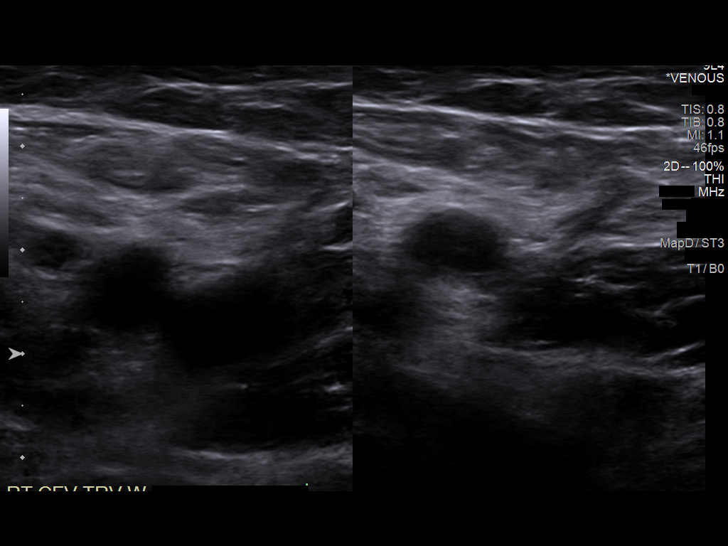
[im 3/29]
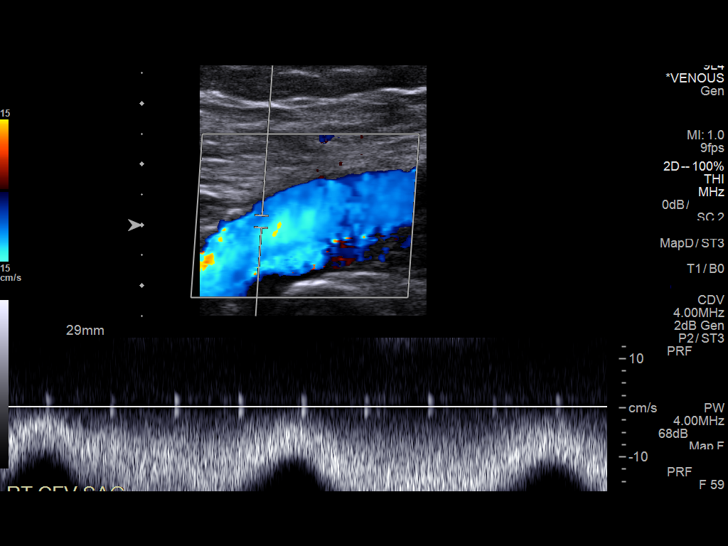
[im 5/29]
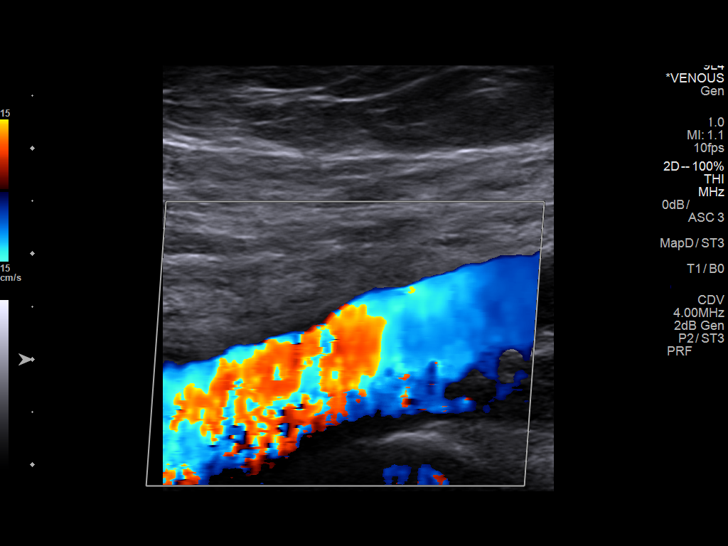
[im 8/29]
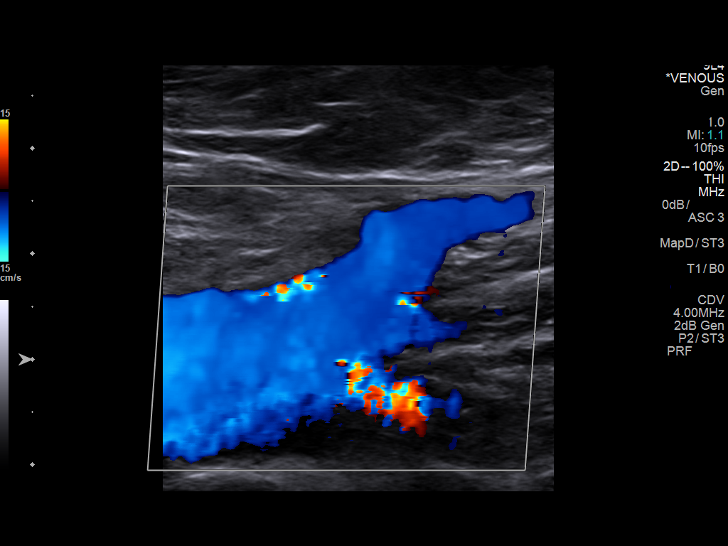
[im 10/29]
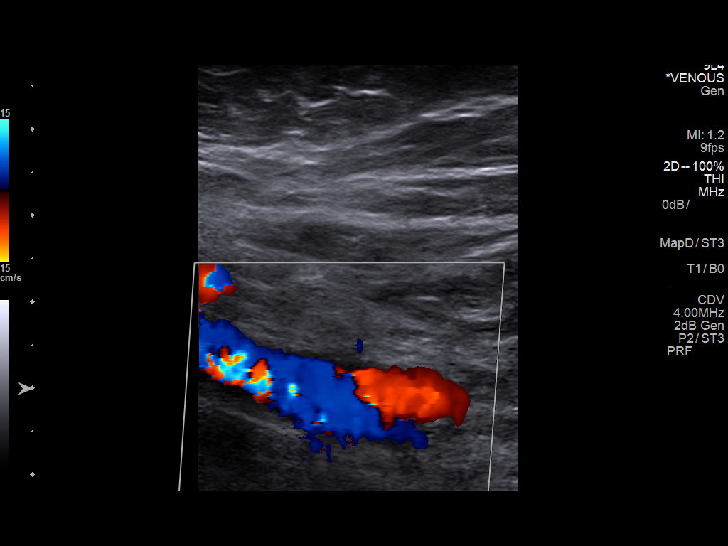
[im 13/29]
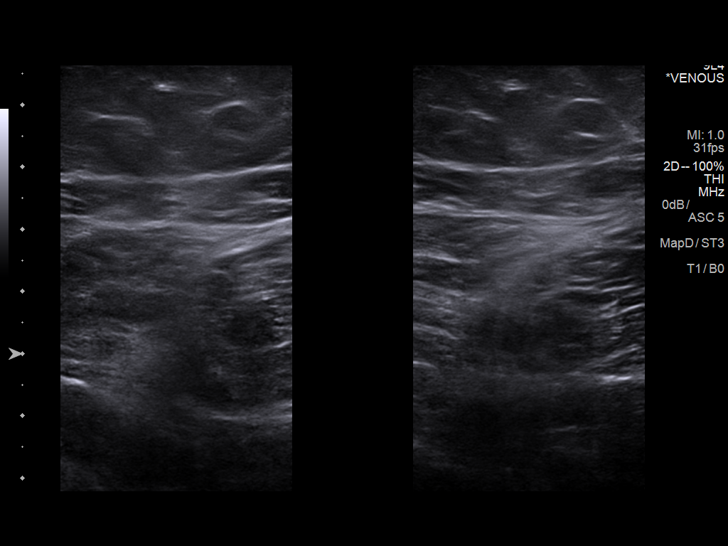
[im 15/29]
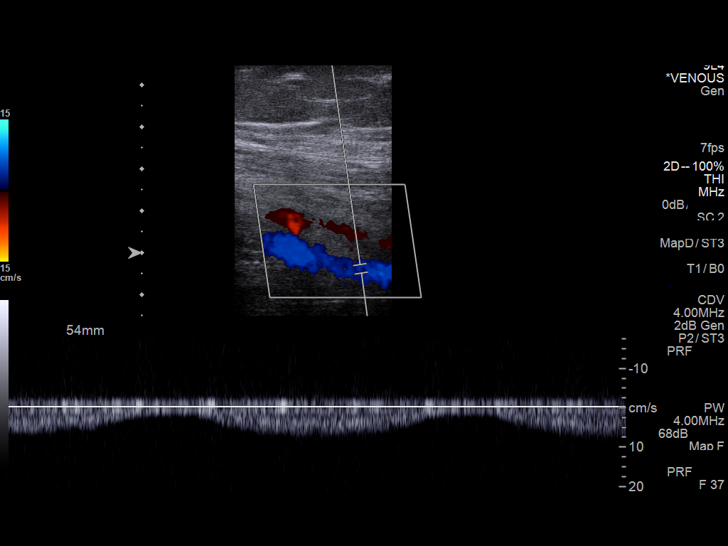
[im 16/29]
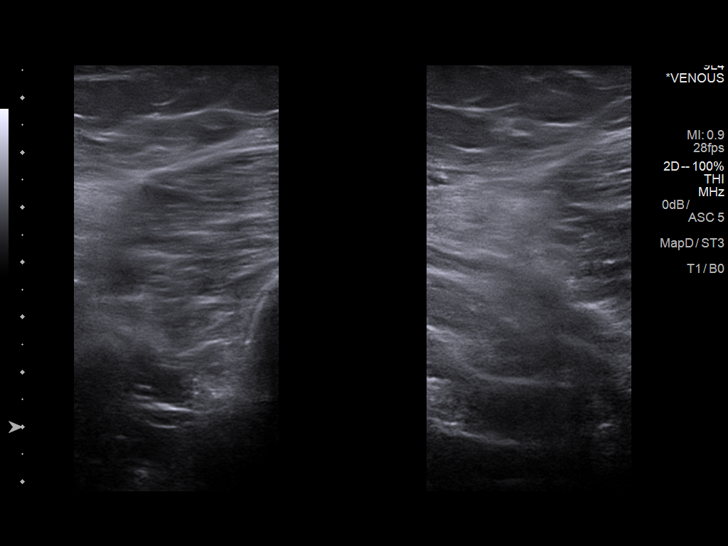
[im 19/29]
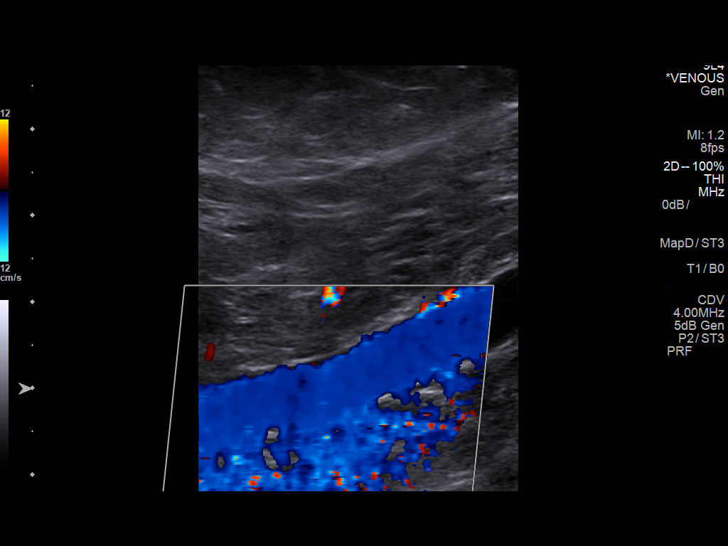
[im 21/29]
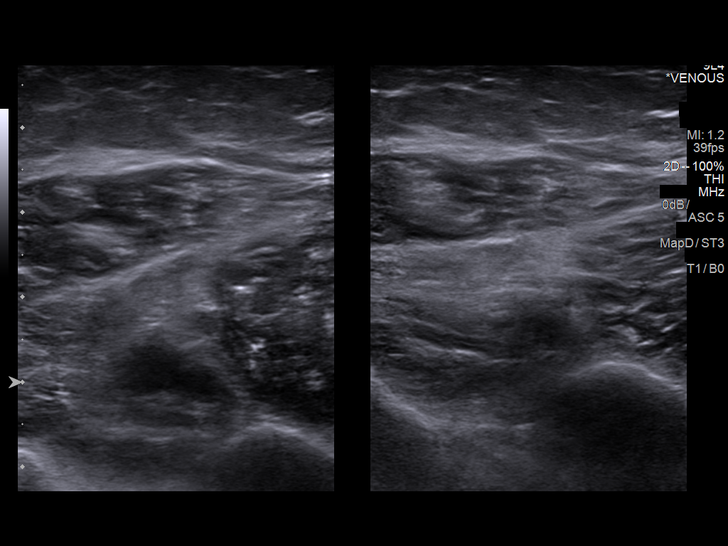
[im 24/29]
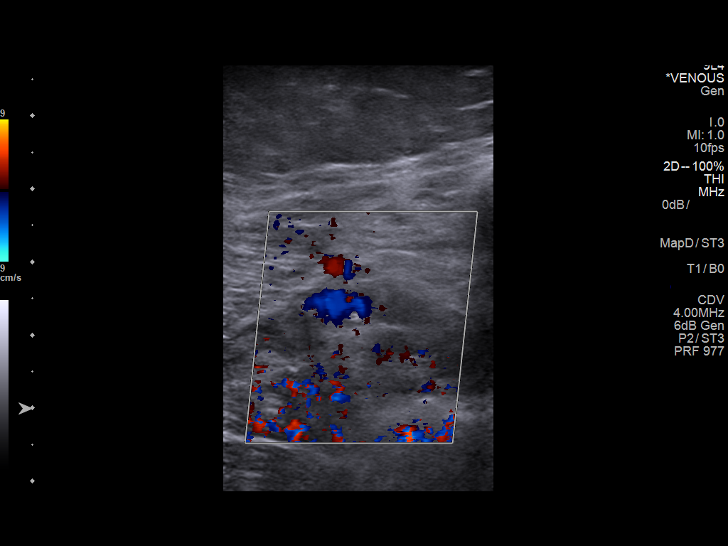
[im 26/29]
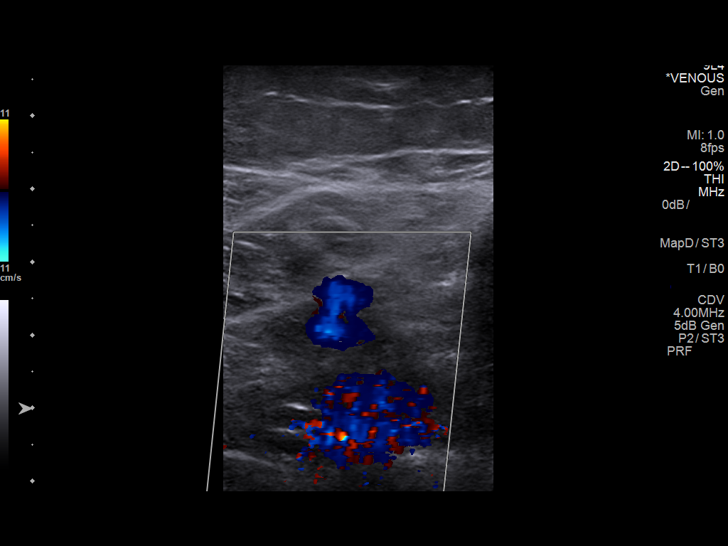
[im 29/29]
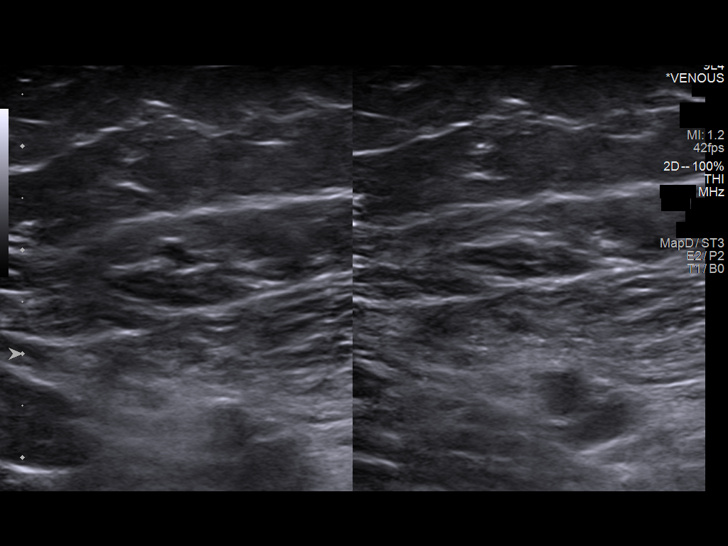

[13 of 24 positions shown; findings below may reference images not displayed]

FINDINGS: Contralateral Common Femoral Vein: Respiratory phasicity is normal
and symmetric with the symptomatic side. No evidence of thrombus.
Normal compressibility.

Common Femoral Vein: No evidence of thrombus. Normal
compressibility, respiratory phasicity and response to augmentation.

Saphenofemoral Junction: No evidence of thrombus. Normal
compressibility and flow on color Doppler imaging.

Profunda Femoral Vein: No evidence of thrombus. Normal
compressibility and flow on color Doppler imaging.

Femoral Vein: No evidence of thrombus. Normal compressibility,
respiratory phasicity and response to augmentation.

Popliteal Vein: No evidence of thrombus. Normal compressibility,
respiratory phasicity and response to augmentation.

Calf Veins: No evidence of thrombus. Normal compressibility and flow
on color Doppler imaging.

Superficial Great Saphenous Vein: No evidence of thrombus. Normal
compressibility.

Venous Reflux:  None.

Other Findings: Spectral broadening on Doppler of both common
femoral veins, significance unclear.
IMPRESSION: No evidence of left lower extremity deep venous thrombosis.

## 2018-09-27 ENCOUNTER — Other Ambulatory Visit: Payer: Self-pay | Admitting: Family Medicine

## 2018-09-27 DIAGNOSIS — Z1231 Encounter for screening mammogram for malignant neoplasm of breast: Secondary | ICD-10-CM

## 2018-11-07 IMAGING — CR DG LUMBAR SPINE 2-3V
2 series · 2 of 2 positions shown · non-contrast
Comparison: MRI of 07/20/2003.  Plain films of 09/25/2012.

CLINICAL DATA: L5-S1 discectomy.

EXAM:
LUMBAR SPINE - 2-3 VIEW

[lateral (1 of 2)]
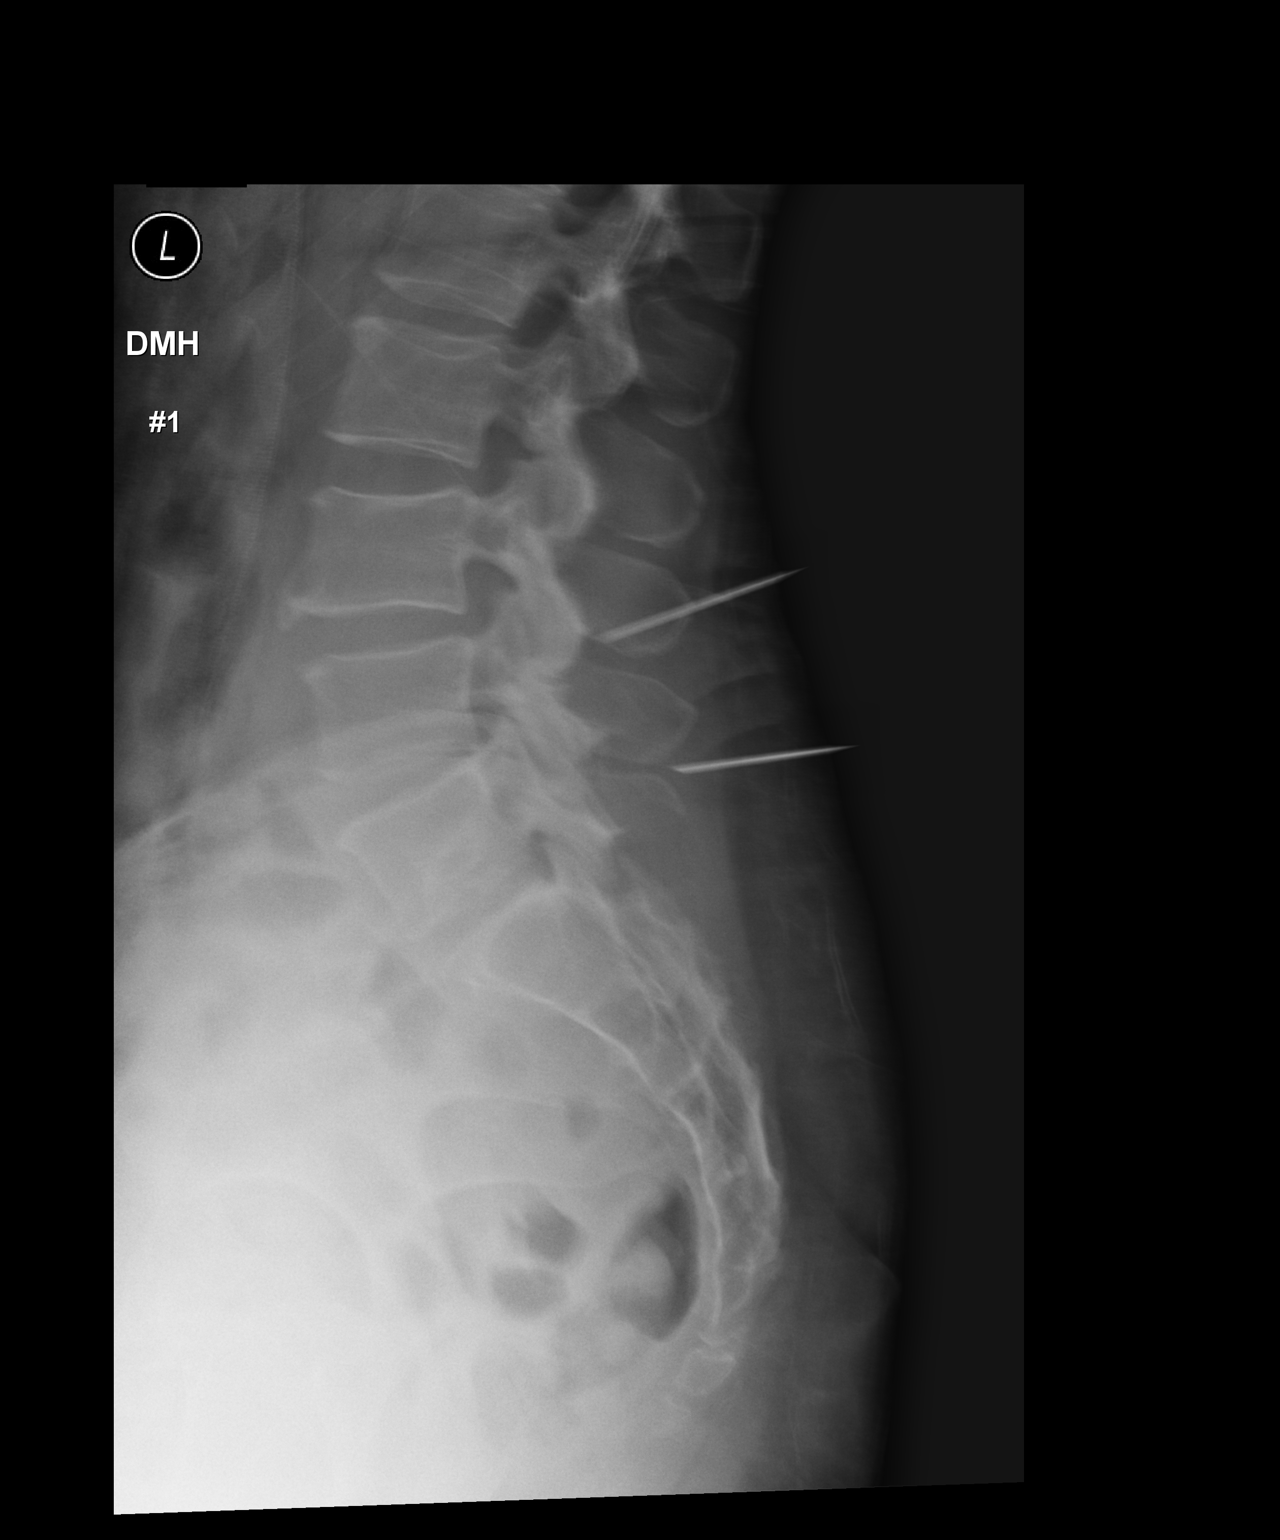

[lateral (2 of 2)]
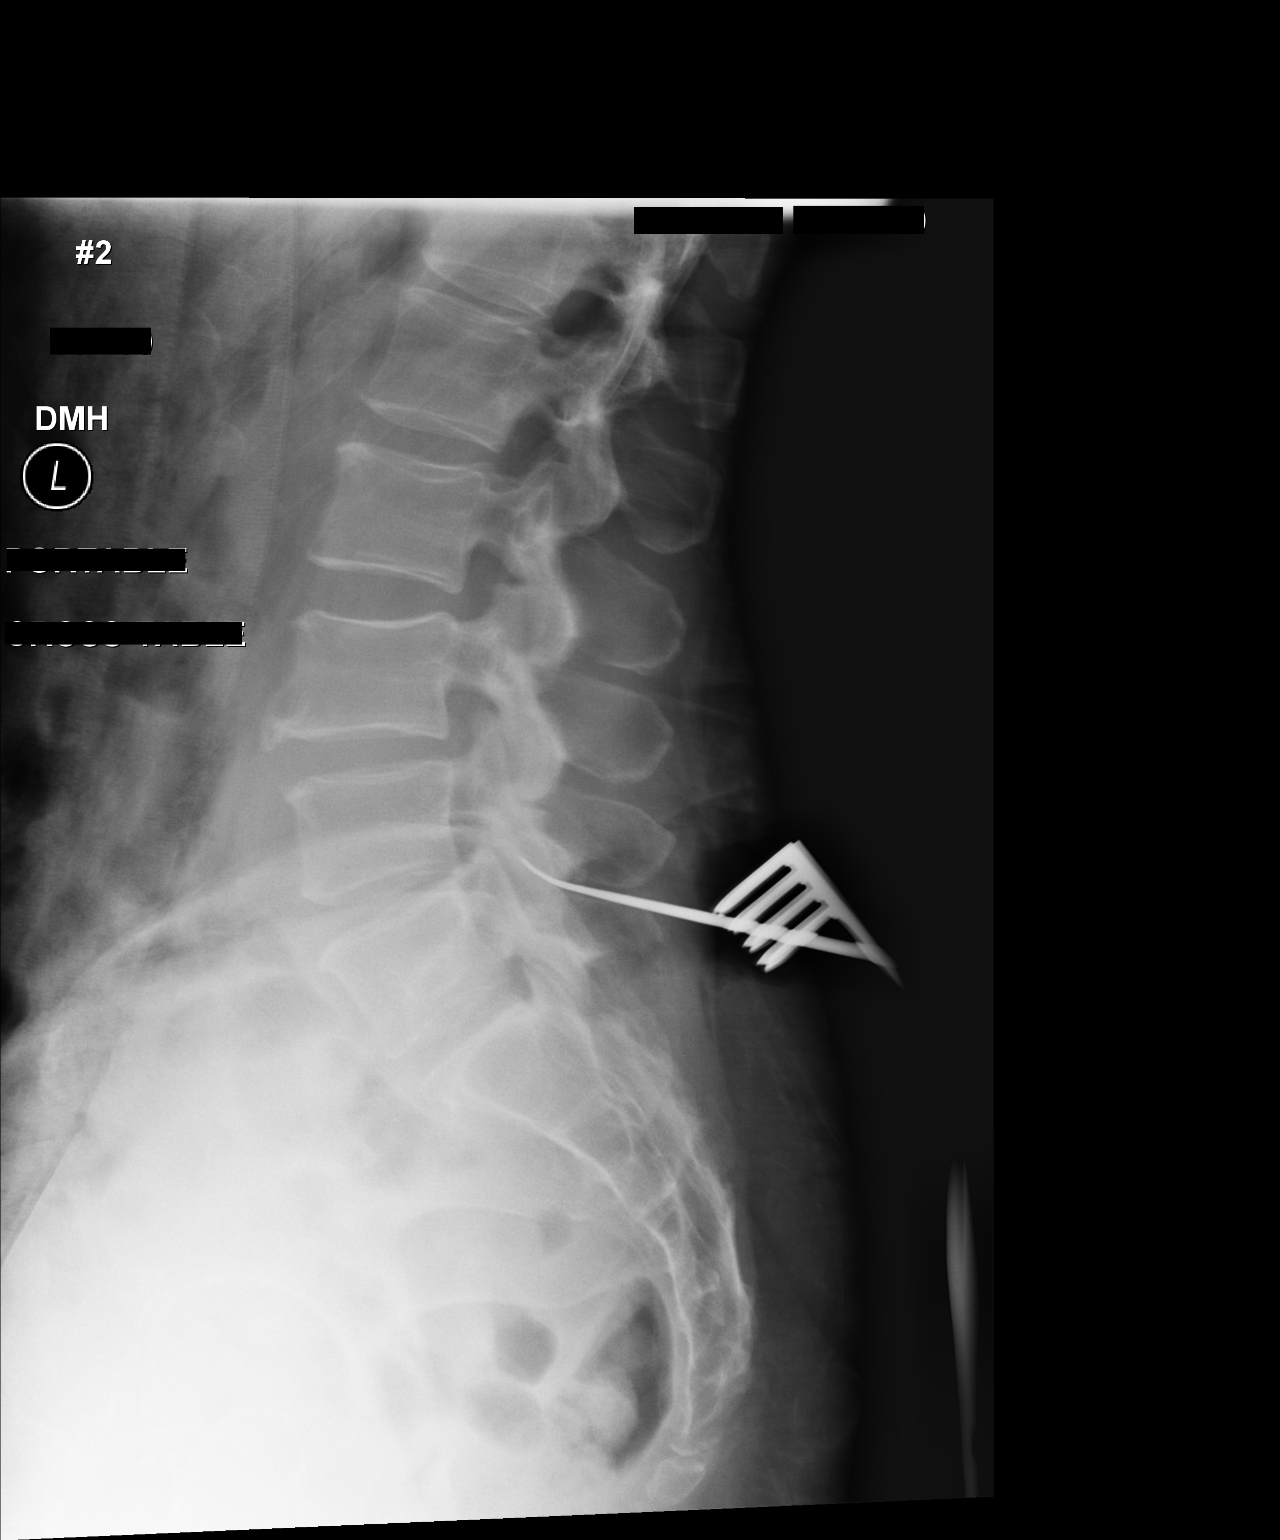

[2 of 2 positions shown; findings below may reference images not displayed]

FINDINGS: Single lateral view labeled [id number]. Presuming nomenclature from
the prior MRI, this demonstrates surgical devices projecting
posterior to the L4 and L5 vertebral bodies. The last open disc
space is presumed L5-S1, as on the prior MRI and plain films.
IMPRESSION: Intraoperative localization of L4 and L5. Report called and
discussed with clinical service at [DATE] p.m..

## 2018-12-18 ENCOUNTER — Ambulatory Visit: Payer: 59 | Admitting: Podiatry

## 2018-12-25 ENCOUNTER — Ambulatory Visit (INDEPENDENT_AMBULATORY_CARE_PROVIDER_SITE_OTHER): Payer: 59 | Admitting: Podiatry

## 2018-12-25 ENCOUNTER — Other Ambulatory Visit: Payer: Self-pay

## 2018-12-25 DIAGNOSIS — M722 Plantar fascial fibromatosis: Secondary | ICD-10-CM

## 2018-12-25 DIAGNOSIS — M21619 Bunion of unspecified foot: Secondary | ICD-10-CM

## 2018-12-25 NOTE — Patient Instructions (Signed)

## 2018-12-31 NOTE — Progress Notes (Signed)
Subjective: 58 year old female presents the office today for concerns of recurrent plantar fasciitis.  She said the injections have been helpful.  She also states when she had the injection for the plantar fascia actually help the bunion left foot.  She denies any recent injury.  The plantar fascial braces have been helpful.  She still doing stretching, icing.  No recent injury or changes otherwise.Denies any systemic complaints such as fevers, chills, nausea, vomiting. No acute changes since last appointment, and no other complaints at this time.   Objective: AAO x3, NAD DP/PT pulses palpable bilaterally, CRT less than 3 seconds There is tenderness palpation of the plantar medial tubercle of the calcaneus insertion plantar fascia bilaterally.  There is no pain with compression of calcaneus.  No pain Achilles tendon.  Bunion deformity present on left foot.  Mild discomfort in this area.  No other areas of tenderness identified.  No open lesions or pre-ulcerative lesions.  No pain with calf compression, swelling, warmth, erythema  Assessment: Bilateral plantar fasciitis  Plan: -All treatment options discussed with the patient including all alternatives, risks, complications.  -Steroid injection performed bilaterally.  See procedure note below.  Continue stretching, icing daily.  Plantar fascial braces were reapplied today.  Discussed shoe modifications and orthotics. -Offloading for the bunion.  Also discussed shoe modifications orthotics be helpful for the bunion. -Patient encouraged to call the office with any questions, concerns, change in symptoms.   Procedure: Injection Tendon/Ligament Discussed alternatives, risks, complications and verbal consent was obtained.  Location: Bilateral plantar fascia at the glabrous junction; medial approach. Skin Prep: Alcohol  Injectate: 0.5cc 0.5% marcaine plain, 0.5 cc 2% lidocaine plain and, 1 cc kenalog 10. Disposition: Patient tolerated procedure well.  Injection site dressed with a band-aid.  Post-injection care was discussed and return precautions discussed.   Return in about 3 weeks (around 01/15/2019).  Trula Slade DPM

## 2019-01-01 ENCOUNTER — Other Ambulatory Visit: Payer: Self-pay

## 2019-01-01 ENCOUNTER — Ambulatory Visit
Admission: RE | Admit: 2019-01-01 | Discharge: 2019-01-01 | Disposition: A | Discharge status: Home / Self Care | Payer: Managed Care, Other (non HMO) | Source: Ambulatory Visit | Attending: Family Medicine | Admitting: Family Medicine

## 2019-01-01 DIAGNOSIS — Z1231 Encounter for screening mammogram for malignant neoplasm of breast: Secondary | ICD-10-CM

## 2019-01-02 DIAGNOSIS — M25561 Pain in right knee: Secondary | ICD-10-CM | POA: Insufficient documentation

## 2019-01-15 ENCOUNTER — Encounter: Payer: Self-pay | Admitting: Podiatry

## 2019-01-15 ENCOUNTER — Other Ambulatory Visit: Payer: Self-pay

## 2019-01-15 ENCOUNTER — Ambulatory Visit (INDEPENDENT_AMBULATORY_CARE_PROVIDER_SITE_OTHER): Payer: Managed Care, Other (non HMO) | Admitting: Podiatry

## 2019-01-15 ENCOUNTER — Ambulatory Visit (INDEPENDENT_AMBULATORY_CARE_PROVIDER_SITE_OTHER): Payer: Managed Care, Other (non HMO) | Admitting: Orthotics

## 2019-01-15 DIAGNOSIS — M21619 Bunion of unspecified foot: Secondary | ICD-10-CM

## 2019-01-15 DIAGNOSIS — M722 Plantar fascial fibromatosis: Secondary | ICD-10-CM | POA: Diagnosis not present

## 2019-01-15 DIAGNOSIS — M2042 Other hammer toe(s) (acquired), left foot: Secondary | ICD-10-CM

## 2019-01-15 NOTE — Progress Notes (Signed)

## 2019-01-15 NOTE — Addendum Note (Signed)
Addended by: Ria Clock on: 01/15/2019 04:06 PM   Modules accepted: Level of Service

## 2019-01-19 ENCOUNTER — Ambulatory Visit: Payer: Managed Care, Other (non HMO) | Attending: Internal Medicine

## 2019-01-19 ENCOUNTER — Other Ambulatory Visit: Payer: Self-pay

## 2019-01-19 DIAGNOSIS — M21619 Bunion of unspecified foot: Secondary | ICD-10-CM | POA: Insufficient documentation

## 2019-01-19 DIAGNOSIS — Z20822 Contact with and (suspected) exposure to covid-19: Secondary | ICD-10-CM

## 2019-01-19 NOTE — Progress Notes (Signed)
Subjective: 59 year old female presents the office today for follow-up evaluation of plantar fasciitis.  She also presents today to get measured for orthotics.  She states that overall she is doing better.  Still some discomfort but the injection was helpful.  No recent injury or falls.  She still doing stretching exercises as well as try to wear supportive shoes.  She is hopeful that the inserts will be helpful as well. Denies any systemic complaints such as fevers, chills, nausea, vomiting. No acute changes since last appointment, and no other complaints at this time.   Objective: AAO x3, NAD DP/PT pulses palpable bilaterally, CRT less than 3 seconds There is minimal tenderness to palpation along the plantar medial tubercle of the calcaneus at the insertion of plantar fascia.  No pain with lateral compression of calcaneus.  No pain with Achilles tendon.  Bunion deformity present left foot without any discomfort.  No other areas of pain. No open lesions or pre-ulcerative lesions.  No pain with calf compression, swelling, warmth, erythema  Assessment: Plantar fasciitis- improving  Plan: -All treatment options discussed with the patient including all alternatives, risks, complications.  -Measured for inserts today with Rick. She is doing better so will hold off on a steroid injection today. Encouraged to continue with stretching/icing daily and supportive shoes.  -Patient encouraged to call the office with any questions, concerns, change in symptoms.   [name] DPM

## 2019-01-20 LAB — NOVEL CORONAVIRUS, NAA: SARS-CoV-2, NAA: NOT DETECTED

## 2019-02-05 ENCOUNTER — Other Ambulatory Visit: Payer: Managed Care, Other (non HMO) | Admitting: Orthotics

## 2019-02-12 ENCOUNTER — Ambulatory Visit (INDEPENDENT_AMBULATORY_CARE_PROVIDER_SITE_OTHER): Payer: Managed Care, Other (non HMO) | Admitting: Orthotics

## 2019-02-12 ENCOUNTER — Other Ambulatory Visit: Payer: Self-pay

## 2019-02-12 DIAGNOSIS — M722 Plantar fascial fibromatosis: Secondary | ICD-10-CM

## 2019-02-12 DIAGNOSIS — M2042 Other hammer toe(s) (acquired), left foot: Secondary | ICD-10-CM

## 2019-02-12 NOTE — Progress Notes (Signed)
Patient came in today to p/up functional foot orthotics.   The orthotics were assessed to both fit and function.  The F/O addressed the biomechanical issues/pathologies as intended, offering good longitudinal arch support, proper offloading, and foot support. There weren't any signs of discomfort or irritation.  The F/O fit properly in footwear with minimal trimming/adjustments. 

## 2019-04-25 ENCOUNTER — Other Ambulatory Visit: Payer: Self-pay | Admitting: Chiropractic Medicine

## 2019-04-25 DIAGNOSIS — M5416 Radiculopathy, lumbar region: Secondary | ICD-10-CM

## 2019-04-30 ENCOUNTER — Telehealth: Payer: Self-pay | Admitting: Nurse Practitioner

## 2019-04-30 NOTE — Telephone Encounter (Signed)
Phone call to patient to review instructions for 13 hr prep for CT w/ contrast on 05/07/19 at 0800. Prescription called into [facility]. Pt aware and verbalized understanding of instructions. Prescription: 1900- 50mg  Prednisone 0100- 50mg  Prednisone 0700- 50mg  Prednisone and 50mg  Benadryl

## 2019-05-07 ENCOUNTER — Ambulatory Visit
Admission: RE | Admit: 2019-05-07 | Discharge: 2019-05-07 | Disposition: A | Discharge status: Home / Self Care | Payer: 59 | Source: Ambulatory Visit | Attending: Chiropractic Medicine | Admitting: Chiropractic Medicine

## 2019-05-07 ENCOUNTER — Other Ambulatory Visit: Payer: Self-pay

## 2019-05-07 DIAGNOSIS — M5416 Radiculopathy, lumbar region: Secondary | ICD-10-CM

## 2019-05-07 MED ORDER — IOPAMIDOL (ISOVUE-M 200) INJECTION 41%
1.0000 mL | Freq: Once | INTRAMUSCULAR | Status: AC
  Administered 2019-05-07: 1 mL via EPIDURAL

## 2019-05-07 MED ORDER — METHYLPREDNISOLONE ACETATE 40 MG/ML INJ SUSP (RADIOLOG
120.0000 mg | Freq: Once | INTRAMUSCULAR | Status: AC
  Administered 2019-05-07: 120 mg via EPIDURAL

## 2019-05-07 NOTE — Discharge Instructions (Signed)

## 2019-06-03 IMAGING — CR DG CHEST 2V
2 series · 2 of 2 positions shown · non-contrast
Comparison: None.

CLINICAL DATA: Preoperative evaluation for cholecystectomy

EXAM:
CHEST - 2 VIEW

[w chest pa]
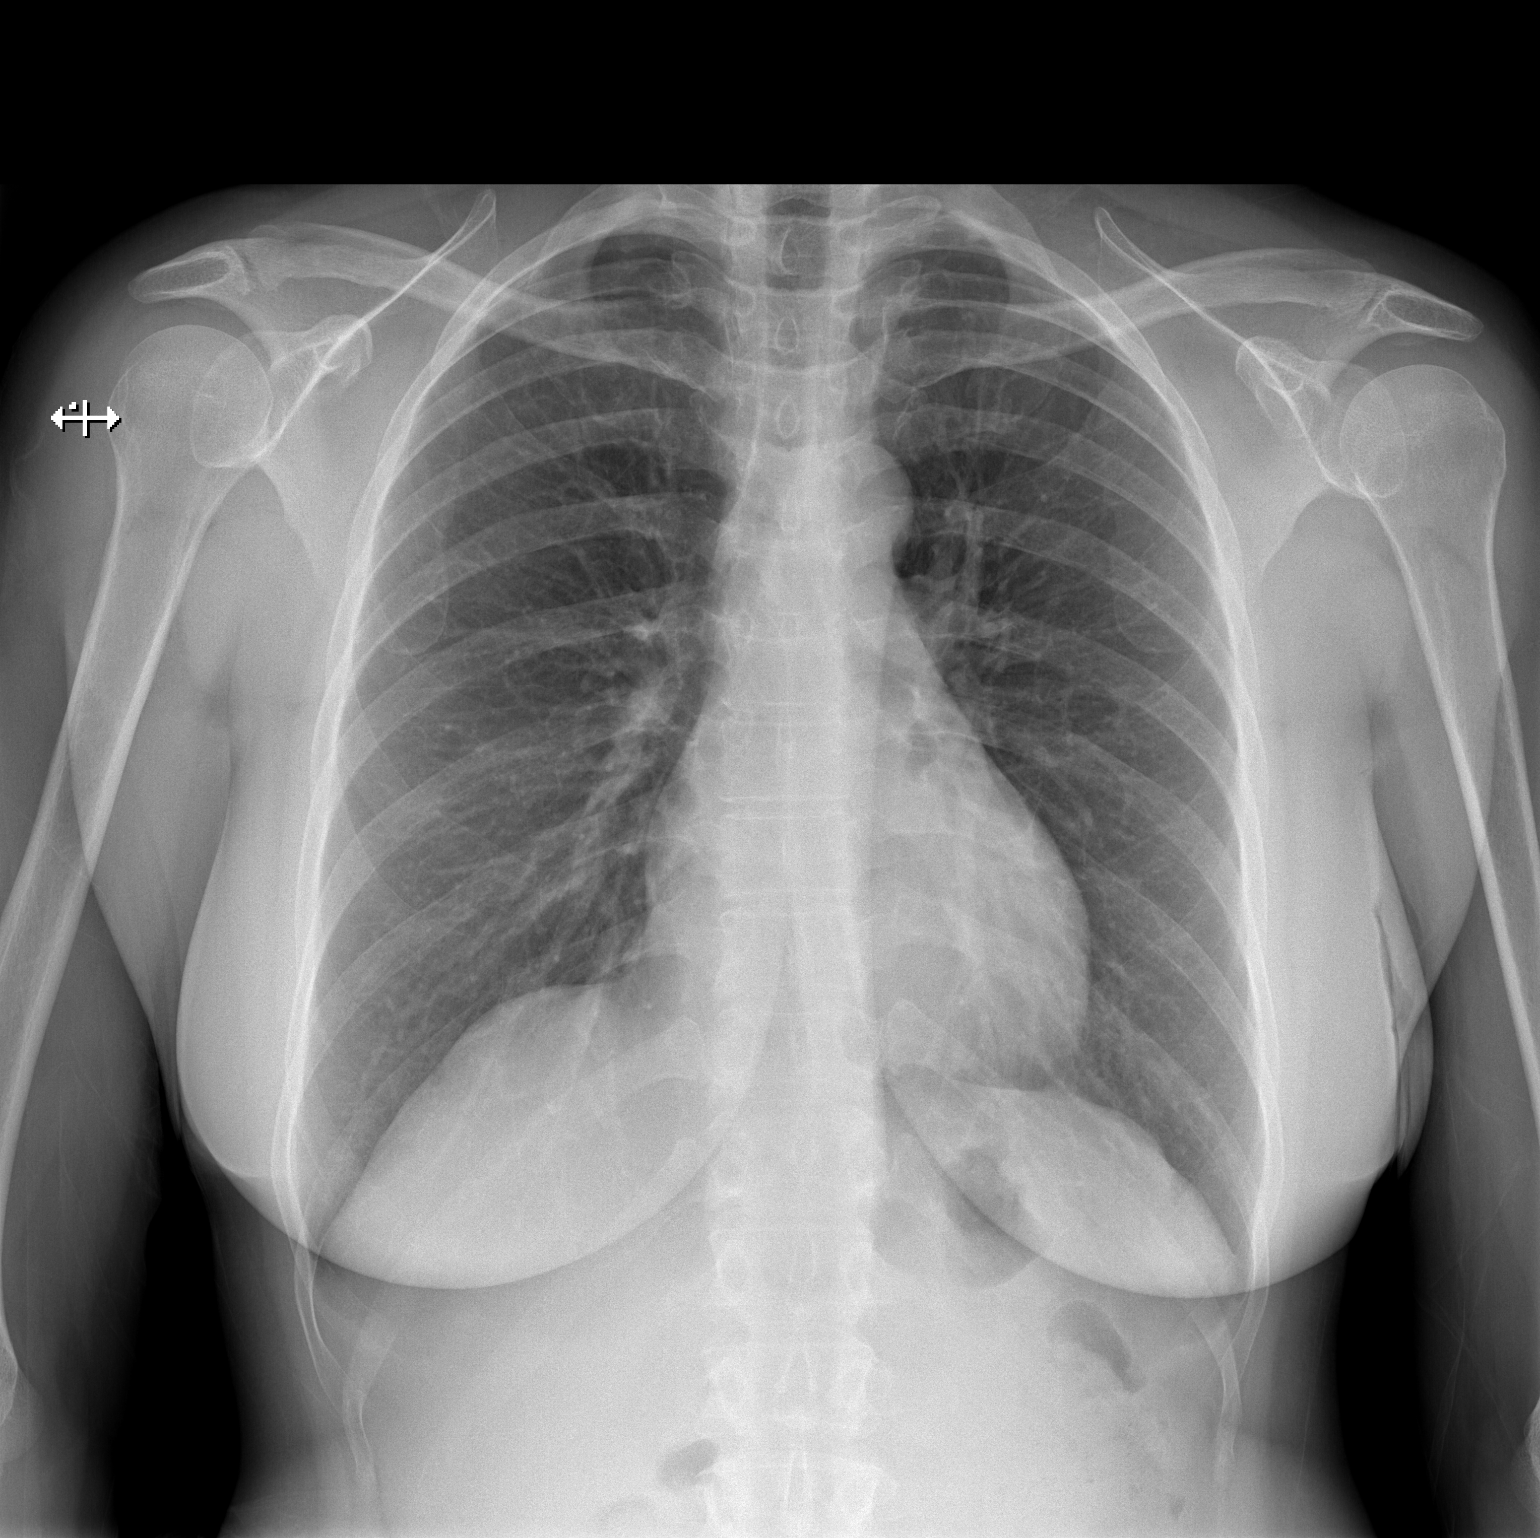

[w chest lat]
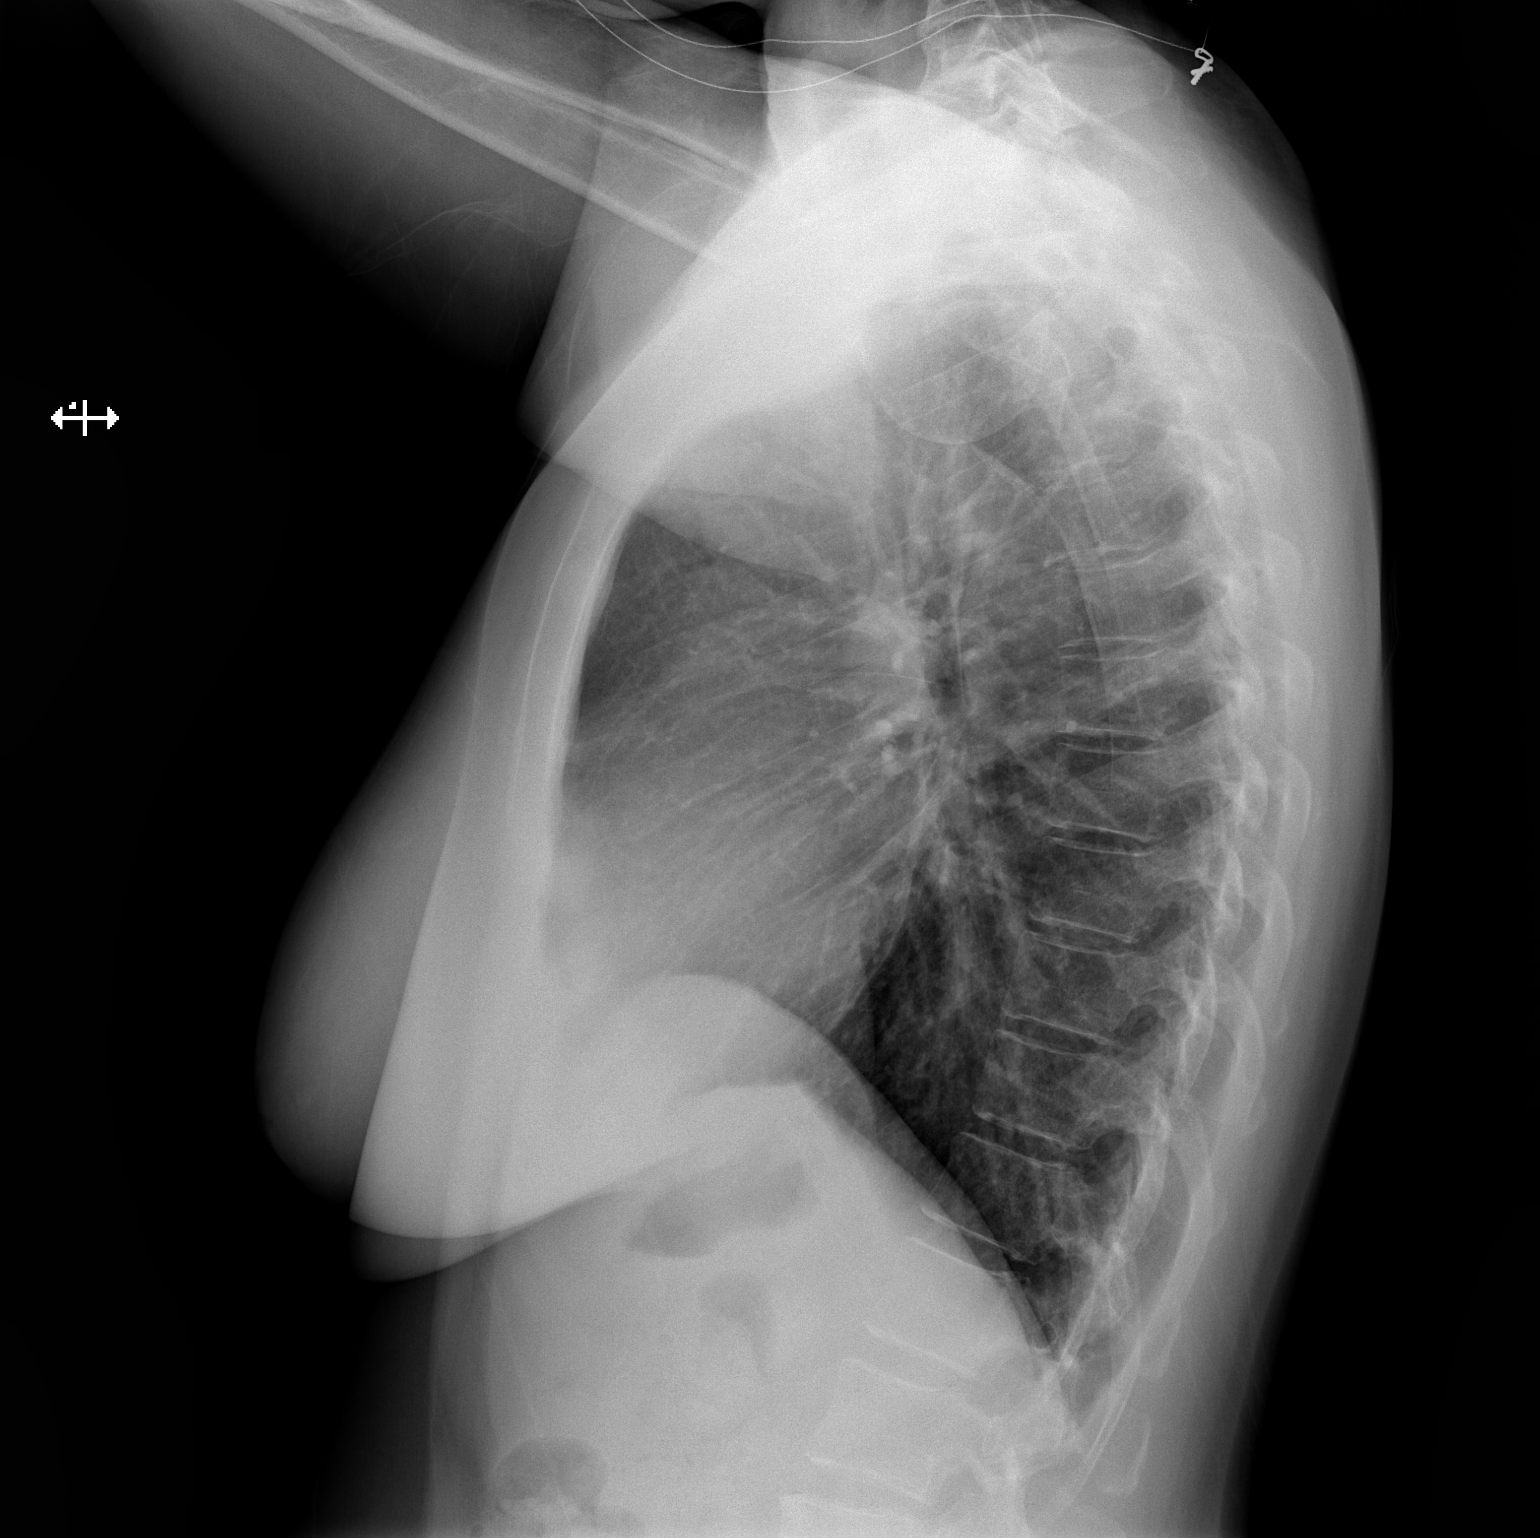

[2 of 2 positions shown; findings below may reference images not displayed]

FINDINGS: Lungs are clear. The heart size and pulmonary vascularity are
normal. No adenopathy. No bone lesions.
IMPRESSION: No edema or consolidation.

## 2019-07-05 ENCOUNTER — Other Ambulatory Visit: Payer: Self-pay | Admitting: Chiropractic Medicine

## 2019-07-05 DIAGNOSIS — M5412 Radiculopathy, cervical region: Secondary | ICD-10-CM

## 2019-07-05 DIAGNOSIS — M5416 Radiculopathy, lumbar region: Secondary | ICD-10-CM

## 2019-08-08 ENCOUNTER — Other Ambulatory Visit: Payer: Self-pay | Admitting: Obstetrics and Gynecology

## 2019-08-08 DIAGNOSIS — M8580 Other specified disorders of bone density and structure, unspecified site: Secondary | ICD-10-CM

## 2019-08-22 ENCOUNTER — Ambulatory Visit
Admission: RE | Admit: 2019-08-22 | Discharge: 2019-08-22 | Disposition: A | Discharge status: Home / Self Care | Payer: 59 | Source: Ambulatory Visit | Attending: Chiropractic Medicine | Admitting: Chiropractic Medicine

## 2019-08-22 ENCOUNTER — Other Ambulatory Visit: Payer: Self-pay

## 2019-08-22 DIAGNOSIS — M5416 Radiculopathy, lumbar region: Secondary | ICD-10-CM

## 2019-08-22 MED ORDER — METHYLPREDNISOLONE ACETATE 40 MG/ML INJ SUSP (RADIOLOG
120.0000 mg | Freq: Once | INTRAMUSCULAR | Status: AC
  Administered 2019-08-22: 120 mg via EPIDURAL

## 2019-08-22 MED ORDER — IOPAMIDOL (ISOVUE-M 200) INJECTION 41%
1.0000 mL | Freq: Once | INTRAMUSCULAR | Status: AC
  Administered 2019-08-22: 1 mL via EPIDURAL

## 2019-08-22 NOTE — Discharge Instructions (Signed)

## 2019-08-30 ENCOUNTER — Telehealth: Payer: Self-pay | Admitting: Nurse Practitioner

## 2019-08-30 NOTE — Telephone Encounter (Signed)
Phone call to patient to review instructions for 13 hr prep for epidural injection w/ contrast on 09/06/19 at 1330. Prescription called into [facility]. Pt aware and verbalized understanding of instructions. Prescription: 0030- 50mg  Prednisone 0630- 50mg  Prednisone 1230- 50mg  Prednisone and 50mg  Benadryl

## 2019-09-06 ENCOUNTER — Other Ambulatory Visit: Payer: Self-pay

## 2019-09-06 ENCOUNTER — Ambulatory Visit
Admission: RE | Admit: 2019-09-06 | Discharge: 2019-09-06 | Disposition: A | Discharge status: Home / Self Care | Payer: 59 | Source: Ambulatory Visit | Attending: Chiropractic Medicine | Admitting: Chiropractic Medicine

## 2019-09-06 DIAGNOSIS — M5412 Radiculopathy, cervical region: Secondary | ICD-10-CM

## 2019-09-06 MED ORDER — TRIAMCINOLONE ACETONIDE 40 MG/ML IJ SUSP (RADIOLOGY)
60.0000 mg | Freq: Once | INTRAMUSCULAR | Status: AC
  Administered 2019-09-06: 60 mg via EPIDURAL

## 2019-09-06 MED ORDER — IOPAMIDOL (ISOVUE-M 300) INJECTION 61%
1.0000 mL | Freq: Once | INTRAMUSCULAR | Status: AC
  Administered 2019-09-06: 1 mL via EPIDURAL

## 2019-09-06 NOTE — Discharge Instructions (Signed)

## 2019-10-22 ENCOUNTER — Ambulatory Visit
Admission: RE | Admit: 2019-10-22 | Discharge: 2019-10-22 | Disposition: A | Discharge status: Home / Self Care | Payer: 59 | Source: Ambulatory Visit | Attending: Obstetrics and Gynecology | Admitting: Obstetrics and Gynecology

## 2019-10-22 ENCOUNTER — Other Ambulatory Visit: Payer: Self-pay

## 2019-10-22 ENCOUNTER — Other Ambulatory Visit: Payer: Self-pay | Admitting: Obstetrics and Gynecology

## 2019-10-22 DIAGNOSIS — M8580 Other specified disorders of bone density and structure, unspecified site: Secondary | ICD-10-CM

## 2019-10-22 DIAGNOSIS — Z1231 Encounter for screening mammogram for malignant neoplasm of breast: Secondary | ICD-10-CM

## 2019-11-05 ENCOUNTER — Encounter (INDEPENDENT_AMBULATORY_CARE_PROVIDER_SITE_OTHER): Payer: Self-pay | Admitting: Bariatrics

## 2019-11-05 ENCOUNTER — Ambulatory Visit (INDEPENDENT_AMBULATORY_CARE_PROVIDER_SITE_OTHER): Payer: 59 | Admitting: Bariatrics

## 2019-11-05 ENCOUNTER — Other Ambulatory Visit: Payer: Self-pay

## 2019-11-05 VITALS — BP 129/5 | HR 67 | Temp 98.6°F | Ht 64.0 in | Wt 188.0 lb

## 2019-11-05 DIAGNOSIS — E559 Vitamin D deficiency, unspecified: Secondary | ICD-10-CM | POA: Diagnosis not present

## 2019-11-05 DIAGNOSIS — E6609 Other obesity due to excess calories: Secondary | ICD-10-CM | POA: Insufficient documentation

## 2019-11-05 DIAGNOSIS — M545 Low back pain, unspecified: Secondary | ICD-10-CM | POA: Diagnosis not present

## 2019-11-05 DIAGNOSIS — R0602 Shortness of breath: Secondary | ICD-10-CM | POA: Diagnosis not present

## 2019-11-05 DIAGNOSIS — R7309 Other abnormal glucose: Secondary | ICD-10-CM

## 2019-11-05 DIAGNOSIS — Z1331 Encounter for screening for depression: Secondary | ICD-10-CM

## 2019-11-05 DIAGNOSIS — Z9189 Other specified personal risk factors, not elsewhere classified: Secondary | ICD-10-CM

## 2019-11-05 DIAGNOSIS — R5383 Other fatigue: Secondary | ICD-10-CM | POA: Diagnosis not present

## 2019-11-05 DIAGNOSIS — Z6832 Body mass index (BMI) 32.0-32.9, adult: Secondary | ICD-10-CM

## 2019-11-05 DIAGNOSIS — Z0289 Encounter for other administrative examinations: Secondary | ICD-10-CM

## 2019-11-05 NOTE — Progress Notes (Signed)
Chief Complaint:   OBESITY Melissa Roth (MR# [id number]) is a 59 y.o. female who presents for evaluation and treatment of obesity and related comorbidities. Current BMI is Body mass index is 32.27 kg/m.Marland Kitchen Melissa Roth has been struggling with her weight for many years and has been unsuccessful in either losing weight, maintaining weight loss, or reaching her healthy weight goal.  Melissa Roth is currently in the action stage of change and ready to dedicate time achieving and maintaining a healthier weight. Melissa Roth is interested in becoming our patient and working on intensive lifestyle modifications including (but not limited to) diet and exercise for weight loss.  Melissa Roth lives alone and states that it is hard to cook for 1 person.  Melissa Roth's habits were reviewed today and are as follows: Her family eats meals together, she thinks her family will eat healthier with her, her desired weight loss is 48-53 lbs, she started gaining weight during COVID in 05/13/18 (dad died 06-12-2018; husband died 08-12-2018), her heaviest weight ever was 200 pounds, she craves chicken and seafood, she skips breakfast frequently and she is frequently drinking liquids with calories.  Depression Screen Melissa Roth's Food and Mood (modified PHQ-9) score was 7.  Depression screen PHQ 2/9 11/05/2019  Decreased Interest 1  Down, Depressed, Hopeless 1  PHQ - 2 Score 2  Altered sleeping 1  Tired, decreased energy 1  Change in appetite 1  Feeling bad or failure about yourself  1  Trouble concentrating 1  Moving slowly or fidgety/restless 0  Suicidal thoughts 0  PHQ-9 Score 7  Difficult doing work/chores Not difficult at all   Subjective:   Other fatigue. Melissa Roth denies daytime somnolence and admits to waking up still tired. Melissa Roth generally gets 6-8 hours of sleep per night, and states that she does not sleep well most nights. Snoring is not present. Apneic episodes are not present. Epworth Sleepiness Score is 6.  SOB (shortness of breath)  on exertion. Melissa Roth notes increasing shortness of breath with exercising and seems to be worsening over time with weight gain. She notes getting out of breath sooner with activity than she used to. This has not gotten worse recently. Melissa Roth denies shortness of breath at rest or orthopnea.   Low back pain, unspecified back pain laterality, unspecified chronicity, unspecified whether sciatica present. Melissa Roth is taking diclofenac as needed for back pain. She reports having had back surgery in May 12, 2016.  Vitamin D deficiency. Melissa Roth is taking Vitamin D supplementation.   Elevated glucose. Melissa Roth reports a glucose level elevated above 100.  Depression screening. Melissa Roth had a mildly positive depression screen with a PHQ-9 score of 7.  At risk for activity intolerance. Melissa Roth is at risk of exercise intolerance due to back pain.  Assessment/Plan:   Other fatigue. Melissa Roth does feel that her weight is causing her energy to be lower than it should be. Fatigue may be related to obesity, depression or many other causes. Labs will be ordered, and in the meanwhile, Melissa Roth will focus on self care including making healthy food choices, increasing physical activity and focusing on stress reduction. EKG 12-Lead, Comprehensive metabolic panel, Hemoglobin A1c, Insulin, random, Lipid Panel With LDL/HDL Ratio, T3, T4, free, TSH, VITAMIN D 25 Hydroxy (Vit-D Deficiency, Fractures) tests ordered today.  SOB (shortness of breath) on exertion. Melissa Roth does not feel that she gets out of breath more easily that she used to when she exercises. Melissa Roth's shortness of breath appears to be obesity related and exercise induced. She has agreed  to work on weight loss and gradually increase exercise to treat her exercise induced shortness of breath. Will continue to monitor closely. She will gradually increase her activities and will avoid pounding exercises.  Low back pain, unspecified back pain laterality, unspecified chronicity, unspecified whether  sciatica present. Melissa Roth will continue her medication as directed and will follow-up with her PCP as scheduled.  Vitamin D deficiency. Low Vitamin D level contributes to fatigue and are associated with obesity, breast, and colon cancer. She agrees to continue to take Vitamin D as directed and VITAMIN D 25 Hydroxy (Vit-D Deficiency, Fractures) level will be checked today.   Elevated glucose.  Comprehensive metabolic panel, Hemoglobin A1c, Insulin, random, Lipid Panel With LDL/HDL Ratio labs will be checked today.   Depression screening. Melissa Roth had a positive depression screening. Depression is commonly associated with obesity and often results in emotional eating behaviors. We will monitor this closely and work on CBT to help improve the non-hunger eating patterns. Referral to Psychology may be required if no improvement is seen as she continues in our clinic.  At risk for activity intolerance. Melissa Roth was given approximately 15 minutes of exercise intolerance counseling today. She is 59 y.o. female and has risk factors exercise intolerance including obesity. We discussed intensive lifestyle modifications today with an emphasis on specific weight loss instructions and strategies. Melissa Roth will slowly increase activity as tolerated.  Repetitive spaced learning was employed today to elicit superior memory formation and behavioral change.  Class 1 obesity due to excess calories with serious comorbidity and body mass index (BMI) of 32.0 to 32.9 in adult.  Melissa Roth is currently in the action stage of change and her goal is to continue with weight loss efforts. I recommend Melissa Roth begin the structured treatment plan as follows:  She has agreed to the Category 1 Plan.   She will work on meal planning, intentional eating, stopping all sugary drinks, and will weigh her meat.  Exercise goals: Melissa Roth will increase walking and will walk 1 mile a day.   Behavioral modification strategies: increasing lean protein intake,  decreasing simple carbohydrates, increasing vegetables, increasing water intake, decreasing eating out, no skipping meals, meal planning and cooking strategies, keeping healthy foods in the home and planning for success.  She was informed of the importance of frequent follow-up visits to maximize her success with intensive lifestyle modifications for her multiple health conditions. She was informed we would discuss her lab results at her next visit unless there is a critical issue that needs to be addressed sooner. Lileigh agreed to keep her next visit at the agreed upon time to discuss these results.  Objective:   Blood pressure (!) 129/5, pulse 67, temperature 98.6 F (37 C), temperature source Oral, height 5\' 4"  (1.626 m), weight 188 lb (85.3 kg), SpO2 100 %. Body mass index is 32.27 kg/m.  EKG: Normal sinus rhythm, rate 64 BPM. Within normal limits.  Indirect Calorimeter completed today shows a VO2 of 186 and a REE of 1298.  Her calculated basal metabolic rate is thus her basal metabolic rate is worse than expected.  General: Cooperative, alert, well developed, in no acute distress. HEENT: Conjunctivae and lids unremarkable. Cardiovascular: Regular rhythm.  Lungs: Normal work of breathing. Neurologic: No focal deficits.   Lab Results  Component Value Date   CREATININE 0.75 06/06/2017   BUN 12 06/06/2017   NA 142 06/06/2017   K 3.5 06/06/2017   CL 108 06/06/2017   CO2 26 06/06/2017   Lab Results  Component Value Date   ALT 15 06/06/2017   AST 25 06/06/2017   ALKPHOS 81 06/06/2017   BILITOT 0.4 06/06/2017   No results found for: HGBA1C No results found for: INSULIN No results found for: TSH No results found for: CHOL, HDL, LDLCALC, LDLDIRECT, TRIG, CHOLHDL Lab Results  Component Value Date   WBC 5.3 06/06/2017   HGB 12.6 06/06/2017   HCT 39.6 06/06/2017   MCV 95.7 06/06/2017   PLT 285 06/06/2017   No results found for: IRON, TIBC, FERRITIN  Attestation  Statements:   Reviewed by clinician on day of visit: allergies, medications, problem list, medical history, surgical history, family history, social history, and previous encounter notes.  Fernanda Drum, am acting as Energy manager for [employer], DO   I have reviewed the above documentation for accuracy and completeness, and I agree with the above. Corinna Capra, DO

## 2019-11-06 ENCOUNTER — Encounter (INDEPENDENT_AMBULATORY_CARE_PROVIDER_SITE_OTHER): Payer: Self-pay | Admitting: Bariatrics

## 2019-11-06 DIAGNOSIS — R7303 Prediabetes: Secondary | ICD-10-CM | POA: Insufficient documentation

## 2019-11-06 LAB — COMPREHENSIVE METABOLIC PANEL
ALT: 40 IU/L — ABNORMAL HIGH (ref 0–32)
AST: 32 IU/L (ref 0–40)
Albumin/Globulin Ratio: 1.4 (ref 1.2–2.2)
Albumin: 4.2 g/dL (ref 3.8–4.9)
Alkaline Phosphatase: 109 IU/L (ref 44–121)
BUN/Creatinine Ratio: 14 (ref 9–23)
BUN: 10 mg/dL (ref 6–24)
Bilirubin Total: 0.2 mg/dL (ref 0.0–1.2)
CO2: 27 mmol/L (ref 20–29)
Calcium: 9.3 mg/dL (ref 8.7–10.2)
Chloride: 103 mmol/L (ref 96–106)
Creatinine, Ser: 0.69 mg/dL (ref 0.57–1.00)
GFR calc Af Amer: 112 mL/min/{1.73_m2} (ref >59)
GFR calc non Af Amer: 97 mL/min/{1.73_m2} (ref >59)
Globulin, Total: 3.1 g/dL (ref 1.5–4.5)
Glucose: 99 mg/dL (ref 65–99)
Potassium: 4.2 mmol/L (ref 3.5–5.2)
Sodium: 139 mmol/L (ref 134–144)
Total Protein: 7.3 g/dL (ref 6.0–8.5)

## 2019-11-06 LAB — LIPID PANEL WITH LDL/HDL RATIO
Cholesterol, Total: 155 mg/dL (ref 100–199)
HDL: 84 mg/dL (ref >39)
LDL Chol Calc (NIH): 57 mg/dL (ref 0–99)
LDL/HDL Ratio: 0.7 ratio (ref 0.0–3.2)
Triglycerides: 71 mg/dL (ref 0–149)
VLDL Cholesterol Cal: 14 mg/dL (ref 5–40)

## 2019-11-06 LAB — HEMOGLOBIN A1C
Est. average glucose Bld gHb Est-mCnc: 117 mg/dL
Hgb A1c MFr Bld: 5.7 % — ABNORMAL HIGH (ref 4.8–5.6)

## 2019-11-06 LAB — INSULIN, RANDOM: INSULIN: 8.9 u[IU]/mL (ref 2.6–24.9)

## 2019-11-06 LAB — T4, FREE: Free T4: 1.01 ng/dL (ref 0.82–1.77)

## 2019-11-06 LAB — TSH: TSH: 3.31 u[IU]/mL (ref 0.450–4.500)

## 2019-11-06 LAB — VITAMIN D 25 HYDROXY (VIT D DEFICIENCY, FRACTURES): Vit D, 25-Hydroxy: 40.9 ng/mL (ref 30.0–100.0)

## 2019-11-06 LAB — T3: T3, Total: 157 ng/dL (ref 71–180)

## 2019-11-19 ENCOUNTER — Encounter (INDEPENDENT_AMBULATORY_CARE_PROVIDER_SITE_OTHER): Payer: Self-pay | Admitting: Bariatrics

## 2019-11-19 ENCOUNTER — Ambulatory Visit (INDEPENDENT_AMBULATORY_CARE_PROVIDER_SITE_OTHER): Payer: 59 | Admitting: Bariatrics

## 2019-11-19 VITALS — BP 121/80 | HR 78 | Temp 97.9°F | Ht 64.0 in | Wt 188.0 lb

## 2019-11-19 DIAGNOSIS — E6609 Other obesity due to excess calories: Secondary | ICD-10-CM

## 2019-11-19 DIAGNOSIS — Z9189 Other specified personal risk factors, not elsewhere classified: Secondary | ICD-10-CM

## 2019-11-19 DIAGNOSIS — Z6832 Body mass index (BMI) 32.0-32.9, adult: Secondary | ICD-10-CM

## 2019-11-19 DIAGNOSIS — E559 Vitamin D deficiency, unspecified: Secondary | ICD-10-CM | POA: Diagnosis not present

## 2019-11-19 DIAGNOSIS — R7303 Prediabetes: Secondary | ICD-10-CM

## 2019-11-19 MED ORDER — VITAMIN D (ERGOCALCIFEROL) 1.25 MG (50000 UNIT) PO CAPS: 50000.0000 [IU] | ORAL_CAPSULE | ORAL | 0 refills | Status: DC

## 2019-11-19 NOTE — Progress Notes (Signed)
Chief Complaint:   OBESITY Melissa Roth is here to discuss her progress with her obesity treatment plan along with follow-up of her obesity related diagnoses. Melissa Roth is on the Category 1 Plan and states she is following her eating plan approximately 60% of the time. Melissa Roth states she is walking 30 minutes 3 times per week.  Today's visit was #: 2 Starting weight: 188 lbs Starting date: 11/05/2019 Today's weight: 188 lbs Today's date: 11/19/2019 Total lbs lost to date: 0 Total lbs lost since last in-office visit: 0  Interim History: Melissa Roth's weight remains the same. She is getting used to eating three meals. She struggles with her water, but has gotten better.  Subjective:   Prediabetes. Melissa Roth has a diagnosis of prediabetes based on her elevated HgA1c and was informed this puts her at greater risk of developing diabetes. She continues to work on diet and exercise to decrease her risk of diabetes. She denies nausea or hypoglycemia. No polyphagia.  Lab Results  Component Value Date   HGBA1C 5.7 (H) 11/05/2019   Lab Results  Component Value Date   INSULIN 8.9 11/05/2019   Vitamin D deficiency. Melissa Roth is taking OTC Vitamin D supplementation.    Ref. Range 11/05/2019 08:18  Vitamin D, 25-Hydroxy Latest Ref Range: 30.0 - 100.0 ng/mL 40.9   At risk for diabetes mellitus.Edit is at higher than average risk for developing diabetes due to prediabetes.   Assessment/Plan:   Prediabetes. [name] will continue to work on weight loss, exercise, increasing healthy fats and protein, and decreasing simple carbohydrates to help decrease the risk of diabetes.   Vitamin D deficiency. Low Vitamin D level contributes to fatigue and are associated with obesity, breast, and colon cancer. She was given a prescription for Vitamin D, Ergocalciferol, (DRISDOL) 1.25 MG (50000 UNIT) CAPS capsule every week #5 with 0 refills and will follow-up for routine testing of Vitamin D, at least 2-3 times per year  to avoid over-replacement.   At risk for diabetes mellitus. Melissa Roth was given approximately 15 minutes of diabetes education and counseling today. We discussed intensive lifestyle modifications today with an emphasis on weight loss as well as increasing exercise and decreasing simple carbohydrates in her diet. We also reviewed medication options with an emphasis on risk versus benefit of those discussed.   Repetitive spaced learning was employed today to elicit superior memory formation and behavioral change.  Class 1 obesity due to excess calories with serious comorbidity and body mass index (BMI) of 32.0 to 32.9 in adult.  Melissa Roth is currently in the action stage of change. As such, her goal is to continue with weight loss efforts. She has agreed to the Category 1 Plan.   She will work on meal planning and decreasing junk food.  Handout was provided on Protein Equivalents.  We reviewed with the patient labs from 11/05/2019 including CMP, lipids, Vitamin D, A1c, insulin, and thyroid panel.  Exercise goals: Melissa Roth will continue walking and using the elliptical.  Behavioral modification strategies: increasing lean protein intake, decreasing simple carbohydrates, increasing vegetables, increasing water intake, decreasing eating out, no skipping meals, meal planning and cooking strategies, keeping healthy foods in the home and planning for success.  Melissa Roth has agreed to follow-up with our clinic in 2 weeks. She was informed of the importance of frequent follow-up visits to maximize her success with intensive lifestyle modifications for her multiple health conditions.   Objective:   Blood pressure 121/80, pulse 78, temperature 97.9 F (36.6 C), temperature  source Oral, height 5\' 4"  (1.626 m), weight 188 lb (85.3 kg), SpO2 99 %. Body mass index is 32.27 kg/m.  General: Cooperative, alert, well developed, in no acute distress. HEENT: Conjunctivae and lids unremarkable. Cardiovascular: Regular  rhythm.  Lungs: Normal work of breathing. Neurologic: No focal deficits.   Lab Results  Component Value Date   CREATININE 0.69 11/05/2019   BUN 10 11/05/2019   NA 139 11/05/2019   K 4.2 11/05/2019   CL 103 11/05/2019   CO2 27 11/05/2019   Lab Results  Component Value Date   ALT 40 (H) 11/05/2019   AST 32 11/05/2019   ALKPHOS 109 11/05/2019   BILITOT 0.2 11/05/2019   Lab Results  Component Value Date   HGBA1C 5.7 (H) 11/05/2019   Lab Results  Component Value Date   INSULIN 8.9 11/05/2019   Lab Results  Component Value Date   TSH 3.310 11/05/2019   Lab Results  Component Value Date   CHOL 155 11/05/2019   HDL 84 11/05/2019   LDLCALC 57 11/05/2019   TRIG 71 11/05/2019   Lab Results  Component Value Date   WBC 5.3 06/06/2017   HGB 12.6 06/06/2017   HCT 39.6 06/06/2017   MCV 95.7 06/06/2017   PLT 285 06/06/2017   No results found for: IRON, TIBC, FERRITIN  Attestation Statements:   Reviewed by clinician on day of visit: allergies, medications, problem list, medical history, surgical history, family history, social history, and previous encounter notes.  08/06/2017, am acting as Fernanda Drum for Energy manager, DO   I have reviewed the above documentation for accuracy and completeness, and I agree with the above. [employer], DO

## 2019-11-20 ENCOUNTER — Encounter (INDEPENDENT_AMBULATORY_CARE_PROVIDER_SITE_OTHER): Payer: Self-pay | Admitting: Bariatrics

## 2019-11-26 ENCOUNTER — Other Ambulatory Visit: Payer: Self-pay

## 2019-11-26 ENCOUNTER — Other Ambulatory Visit: Payer: Self-pay | Admitting: Orthopedic Surgery

## 2019-11-26 ENCOUNTER — Telehealth: Payer: Self-pay

## 2019-11-26 DIAGNOSIS — M545 Low back pain, unspecified: Secondary | ICD-10-CM

## 2019-11-26 MED ORDER — PREDNISONE 50 MG PO TABS: ORAL_TABLET | ORAL | 0 refills | Status: DC

## 2019-11-26 NOTE — Telephone Encounter (Signed)
Called patient to let her know I sent in [facility] [place] for her 13-hr prep.  She states she understands how to take this, since she has done this before.  Prednisone 50mg  PO 12/07/19 @ 0030, 0630 and 1230; Benadryl 50mg  PO 12/3 @ 1230.

## 2019-12-03 ENCOUNTER — Ambulatory Visit (INDEPENDENT_AMBULATORY_CARE_PROVIDER_SITE_OTHER): Payer: 59 | Admitting: Bariatrics

## 2019-12-03 ENCOUNTER — Encounter (INDEPENDENT_AMBULATORY_CARE_PROVIDER_SITE_OTHER): Payer: Self-pay | Admitting: Bariatrics

## 2019-12-03 ENCOUNTER — Other Ambulatory Visit: Payer: Self-pay

## 2019-12-03 VITALS — BP 122/63 | HR 64 | Temp 97.8°F | Ht 64.0 in | Wt 187.0 lb

## 2019-12-03 DIAGNOSIS — Z9189 Other specified personal risk factors, not elsewhere classified: Secondary | ICD-10-CM

## 2019-12-03 DIAGNOSIS — R7303 Prediabetes: Secondary | ICD-10-CM

## 2019-12-03 DIAGNOSIS — Z6832 Body mass index (BMI) 32.0-32.9, adult: Secondary | ICD-10-CM | POA: Diagnosis not present

## 2019-12-03 DIAGNOSIS — E6609 Other obesity due to excess calories: Secondary | ICD-10-CM | POA: Diagnosis not present

## 2019-12-03 DIAGNOSIS — M545 Low back pain, unspecified: Secondary | ICD-10-CM | POA: Diagnosis not present

## 2019-12-03 NOTE — Progress Notes (Signed)
Chief Complaint:   OBESITY Melissa Roth is here to discuss her progress with her obesity treatment plan along with follow-up of her obesity related diagnoses. Melissa Roth is on the Category 1 Plan and states she is following her eating plan approximately 80% of the time. Melissa Roth states she is exercising 0 minutes 0 times per week.  Today's visit was #: 3 Starting weight: 188 lbs Starting date: 11/05/2019 Today's weight: 187 lbs Today's date: 12/03/2019 Total lbs lost to date: 1 Total lbs lost since last in-office visit: 1  Interim History: Melissa Roth is down 1 lb. She reports drinking adequate water.  Subjective:   Prediabetes. Melissa Roth has a diagnosis of prediabetes based on her elevated HgA1c and was informed this puts her at greater risk of developing diabetes. She continues to work on diet and exercise to decrease her risk of diabetes. She denies nausea or hypoglycemia. Melissa Roth is on no medication.  Lab Results  Component Value Date   HGBA1C 5.7 (H) 11/05/2019   Lab Results  Component Value Date   INSULIN 8.9 11/05/2019   Low back pain, unspecified back pain laterality, unspecified chronicity, unspecified whether sciatica present. Melissa Roth is taking pain medications as needed; has had injections.  At risk for activity intolerance. Melissa Roth is at risk of exercise intolerance due to low back pain.  Assessment/Plan:   Prediabetes. Melissa Roth will continue to work on weight loss, exercise, and decreasing refined carbohydrates to help decrease the risk of diabetes.   Low back pain, unspecified back pain laterality, unspecified chronicity, unspecified whether sciatica present. Melissa Roth will follow-up with her PCP as scheduled.  At risk for activity intolerance. Melissa Roth was given approximately 15 minutes of exercise intolerance counseling today. She is 59 y.o. female and has risk factors exercise intolerance including obesity. We discussed intensive lifestyle modifications today with an emphasis on  specific weight loss instructions and strategies. Melissa Roth will slowly increase activity as tolerated.  Repetitive spaced learning was employed today to elicit superior memory formation and behavioral change.  Class 1 obesity due to excess calories with serious comorbidity and body mass index (BMI) of 32.0 to 32.9 in adult.  Melissa Roth is currently in the action stage of change. As such, her goal is to continue with weight loss efforts. She has agreed to the Category 1 Plan.   She will work on meal planning and intentional eating.   We independently reviewed with the patient labs from 11/05/2019 including CMP, lipids, Vitamin D, A1c, insulin, and thyroid panel.  Exercise goals: All adults should avoid inactivity. Some physical activity is better than none, and adults who participate in any amount of physical activity gain some health benefits.  Behavioral modification strategies: increasing lean protein intake, decreasing simple carbohydrates, increasing vegetables, increasing water intake, decreasing eating out, no skipping meals, meal planning and cooking strategies, keeping healthy foods in the home and planning for success.  Melissa Roth has agreed to follow-up with our clinic in 2-3 weeks. She was informed of the importance of frequent follow-up visits to maximize her success with intensive lifestyle modifications for her multiple health conditions.   Objective:   Blood pressure 122/63, pulse 64, temperature 97.8 F (36.6 C), height 5\' 4"  (1.626 m), weight 187 lb (84.8 kg), SpO2 99 %. Body mass index is 32.1 kg/m.  General: Cooperative, alert, well developed, in no acute distress. HEENT: Conjunctivae and lids unremarkable. Cardiovascular: Regular rhythm.  Lungs: Normal work of breathing. Neurologic: No focal deficits.   Lab Results  Component Value Date  CREATININE 0.69 11/05/2019   BUN 10 11/05/2019   NA 139 11/05/2019   K 4.2 11/05/2019   CL 103 11/05/2019   CO2 27 11/05/2019   Lab  Results  Component Value Date   ALT 40 (H) 11/05/2019   AST 32 11/05/2019   ALKPHOS 109 11/05/2019   BILITOT 0.2 11/05/2019   Lab Results  Component Value Date   HGBA1C 5.7 (H) 11/05/2019   Lab Results  Component Value Date   INSULIN 8.9 11/05/2019   Lab Results  Component Value Date   TSH 3.310 11/05/2019   Lab Results  Component Value Date   CHOL 155 11/05/2019   HDL 84 11/05/2019   LDLCALC 57 11/05/2019   TRIG 71 11/05/2019   Lab Results  Component Value Date   WBC 5.3 06/06/2017   HGB 12.6 06/06/2017   HCT 39.6 06/06/2017   MCV 95.7 06/06/2017   PLT 285 06/06/2017   No results found for: IRON, TIBC, FERRITIN  Attestation Statements:   Reviewed by clinician on day of visit: allergies, medications, problem list, medical history, surgical history, family history, social history, and previous encounter notes.  Fernanda Drum, am acting as Energy manager for [employer], DO   I have reviewed the above documentation for accuracy and completeness, and I agree with the above. Corinna Capra, DO

## 2019-12-07 ENCOUNTER — Other Ambulatory Visit: Payer: Self-pay

## 2019-12-07 ENCOUNTER — Ambulatory Visit
Admission: RE | Admit: 2019-12-07 | Discharge: 2019-12-07 | Disposition: A | Discharge status: Home / Self Care | Payer: 59 | Source: Ambulatory Visit | Attending: Orthopedic Surgery | Admitting: Orthopedic Surgery

## 2019-12-07 DIAGNOSIS — M545 Low back pain, unspecified: Secondary | ICD-10-CM

## 2019-12-07 MED ORDER — IOPAMIDOL (ISOVUE-M 200) INJECTION 41%
1.0000 mL | Freq: Once | INTRAMUSCULAR | Status: AC
  Administered 2019-12-07: 1 mL via EPIDURAL

## 2019-12-07 MED ORDER — METHYLPREDNISOLONE ACETATE 40 MG/ML INJ SUSP (RADIOLOG
120.0000 mg | Freq: Once | INTRAMUSCULAR | Status: AC
  Administered 2019-12-07: 120 mg via EPIDURAL

## 2019-12-07 NOTE — Discharge Instructions (Signed)

## 2019-12-11 ENCOUNTER — Other Ambulatory Visit (INDEPENDENT_AMBULATORY_CARE_PROVIDER_SITE_OTHER): Payer: Self-pay | Admitting: Bariatrics

## 2019-12-11 DIAGNOSIS — E559 Vitamin D deficiency, unspecified: Secondary | ICD-10-CM

## 2019-12-19 ENCOUNTER — Other Ambulatory Visit: Payer: Self-pay

## 2019-12-19 ENCOUNTER — Ambulatory Visit (INDEPENDENT_AMBULATORY_CARE_PROVIDER_SITE_OTHER): Payer: 59 | Admitting: Internal Medicine

## 2019-12-19 ENCOUNTER — Encounter: Payer: Self-pay | Admitting: Internal Medicine

## 2019-12-19 VITALS — BP 120/70 | HR 69 | Ht 64.0 in | Wt 192.0 lb

## 2019-12-19 DIAGNOSIS — Z8249 Family history of ischemic heart disease and other diseases of the circulatory system: Secondary | ICD-10-CM | POA: Diagnosis not present

## 2019-12-19 NOTE — Progress Notes (Signed)
Cardiology Office Note:    Date:  12/19/2019   ID:  Melissa Roth, DOB 1960-07-13, MRN [id number]  PCP:  Juluis Rainier, MD  [facility] Cardiologist:  No primary care provider on file.  [facility] Electrophysiologist:  None   CC: Prevention Visit Consulted for the evaluation of FHx of CAD at the behest of Juluis Rainier, MD  History of Present Illness:    Melissa Roth is a 59 y.o. female  Obesity, who presents for evaluation.  Patient notes that she is feeling fine.  Father recently died from an MI and is getting checked out.  Husband (deceased) was a Systems analyst.  Has had no chest pain, chest pressure, chest tightness, chest stinging.  Patient exertion notable for walking and doing eliptical  and feels no symptoms.  No shortness of breath, DOE.  No PND or orthopnea.  Notes weight gain over the past year, but no leg swelling , or abdominal swelling.  No syncope or near syncope.  Notes that with working out she has some leg pain.  Not with exercise but after, not with going up a hill or treadmill.  No rest pain, but pain after walking.  Doesn't have her legs hang down.  Patient reports no prior cardiac testing including  echo,  stress test,  heart catheterizations,  cardioversion,  ablations.   Past Medical History:  Diagnosis Date  . Back pain   . Cervical disc disorder with radiculopathy of cervical region   . PONV (postoperative nausea and vomiting)   . Sciatica   . Vitamin D deficiency     Past Surgical History:  Procedure Laterality Date  . ABDOMINAL HYSTERECTOMY    . CHOLECYSTECTOMY N/A 06/07/2017   Procedure: LAPAROSCOPIC CHOLECYSTECTOMY;  Surgeon: Jimmye Norman, MD;  Location: [facility] OR;  Service: General;  Laterality: N/A;  . FOOT SURGERY    . LUMBAR LAMINECTOMY/DECOMPRESSION MICRODISCECTOMY Left 11/10/2016   Procedure: Left L5-S1 discectomy;  Surgeon: Venita Lick, MD;  Location: [facility] OR;  Service: Orthopedics;  Laterality: Left;  120 mins     Current Medications: Current Meds  Medication Sig  . baclofen (LIORESAL) 10 MG tablet   . benzonatate (TESSALON) 100 MG capsule Take 100 mg by mouth as needed.  . diclofenac (VOLTAREN) 75 MG EC tablet as needed.  Marland Kitchen estradiol (ESTRACE) 0.1 MG/GM vaginal cream as needed.  Marland Kitchen HYDROcodone-acetaminophen (NORCO/VICODIN) 5-325 MG tablet as needed.  . Multiple Vitamins-Minerals (MULTIVITAMIN WITH MINERALS) tablet Take 1 tablet by mouth daily.  . predniSONE (DELTASONE) 50 MG tablet Take Prednisone 50 mg by mouth on 12/07/19 at 12:30a.m., 6:30a.m. and 12:30p.m.; take Benadryl 50 mg by mouth on 12/07/19 at 12:30p.m. (Patient taking differently: as needed. Take Prednisone 50 mg by mouth on 12/07/19 at 12:30a.m., 6:30a.m. and 12:30p.m.; take Benadryl 50 mg by mouth on 12/07/19 at 12:30p.m.)  . thiamine (VITAMIN B-1) 100 MG tablet Take 100 mg by mouth daily.  . valACYclovir (VALTREX) 500 MG tablet as needed.  . Vitamin D, Ergocalciferol, (DRISDOL) 1.25 MG (50000 UNIT) CAPS capsule Take 1 capsule (50,000 Units total) by mouth every 7 (seven) days.     Allergies:   Iodinated diagnostic agents and Sulfa antibiotics   Social History   Socioeconomic History  . Marital status: Widowed    Spouse name: Not on file  . Number of children: Not on file  . Years of education: Not on file  . Highest education level: Not on file  Occupational History  . Occupation: Scientist, research (physical sciences) Rep  Tobacco Use  . Smoking status: Never Smoker  . Smokeless tobacco: Never Used  Vaping Use  . Vaping Use: Never used  Substance and Sexual Activity  . Alcohol use: Yes    Comment: occ  . Drug use: No  . Sexual activity: Not on file  Other Topics Concern  . Not on file  Social History Narrative  . Not on file   Social Determinants of Health   Financial Resource Strain: Not on file  Food Insecurity: Not on file  Transportation Needs: Not on file  Physical Activity: Not on file  Stress: Not on file  Social Connections:  Not on file    Husband died last year from [employer] Client service rep for [employer]  Family History: The patient's family history includes Alcoholism in her father; Heart attack in her father; Hyperlipidemia in her brother, father, and mother; Hypertension in her brother, father, and mother; Obesity in her mother; Sleep apnea in her mother; Sudden death in her father. There is no history of Diabetes.  History of coronary artery disease notable for father (has HTN). History of heart failure notable for father. No history of cardiomyopathies including hypertrophic cardiomyopathy, left ventricular non-compaction, or arrhythmogenic right ventricular cardiomyopathy. History of arrhythmia notable for no members. Denies family history of sudden cardiac death including drowning, car accidents, or unexplained deaths in the family (her father had MI). No history of bicuspid aortic valve or aortic aneurysm or dissection.  ROS:   Please see the history of present illness.     All other systems reviewed and are negative.  EKGs/Labs/Other Studies Reviewed:    The following studies were reviewed today: 11/05/19: SR 64 WNL  Recent Labs: 11/05/2019: ALT 40; BUN 10; Creatinine, Ser 0.69; Potassium 4.2; Sodium 139; TSH 3.310  Recent Lipid Panel    Component Value Date/Time   CHOL 155 11/05/2019 0818   TRIG 71 11/05/2019 0818   HDL 84 11/05/2019 0818   LDLCALC 57 11/05/2019 0818   Hgb 12.8 HCT 37.4 Plt 259 AST/ALT 24/27 Creatinine 0.69 TSH 1.85  Risk Assessment/Calculations:     The 10-year ASCVD risk score Denman George DC Montez Hageman., et al., 2013) is: 1.8%   Values used to calculate the score:     Age: 59 years     Sex: Female     Is Non-Hispanic African American: Yes     Diabetic: No     Tobacco smoker: No     Systolic Blood Pressure: 120 mmHg     Is BP treated: No     HDL Cholesterol: 84 mg/dL     Total Cholesterol: 155 mg/dL  Physical Exam:    VS:  BP 120/70   Pulse 69   Ht 5\' 4"   (1.626 m)   Wt 192 lb (87.1 kg)   SpO2 100%   BMI 32.96 kg/m     Wt Readings from Last 3 Encounters:  12/19/19 192 lb (87.1 kg)  12/03/19 187 lb (84.8 kg)  11/19/19 188 lb (85.3 kg)     GEN:  Well nourished, well developed in no acute distress HEENT: Normal NECK: No JVD; No carotid bruits LYMPHATICS: No lymphadenopathy CARDIAC: RRR, no murmurs, rubs, gallops RESPIRATORY:  Clear to auscultation without rales, wheezing or rhonchi  ABDOMEN: Soft, non-tender, non-distended MUSCULOSKELETAL:  No edema; No deformity  SKIN: Warm and dry NEUROLOGIC:  Alert and oriented x 3 PSYCHIATRIC:  Normal affect   ASSESSMENT:    1. Family history of coronary artery disease  PLAN:    In order of problems listed above:  Cardiac Preventive Visit Family history of coronary artery disease AHA Life's Simple Seven- People with at least five ideal Life's Simple 7 metrics had a 78% reduced risk for heart-related death compared to people with no ideal metrics. - Discussed recommendation of Mediterranean and Dash Diets; discussed the evidence behind vegan diets - Discussed recommendations for 150 minus weekly exercise - Discussed the important of blood sugar and blood pressure control - offered CAC scoring and aggressive cholesterol management - stress the important of a smoke free environment - though BMI is not a perfect measurement in all patient populations; a BMI < 30 can improve cardiac outcomes; present BMI is 32-.> patient started at [facility]  Atypical leg pain: If patient begins to develop claudication pain; we will pursue ABI and Duplex texting  One year follow up unless new symptoms or abnormal test results warranting change in plan (possible may have claudication pain; we will see if her exercise program causes this to worsen).  Would be reasonable for  Virtual Follow up Would be reasonable for  APP Follow up        Medication Adjustments/Labs and Tests  Ordered: Current medicines are reviewed at length with the patient today.  Concerns regarding medicines are outlined above.  Orders Placed This Encounter  Procedures  . CT CARDIAC SCORING (SELF PAY ONLY)   No orders of the defined types were placed in this encounter.   Patient Instructions  Medication Instructions:  Your physician recommends that you continue on your current medications as directed. Please refer to the Current Medication list given to you today.  Labwork: None ordered.  Testing/Procedures: Your physician has requested that you have cardiac CT. Cardiac computed tomography (CT) is a painless test that uses an x-ray machine to take clear, detailed pictures of your heart. For further information please visit [email or web address] Please follow instruction sheet as given.   Follow-Up: Your physician recommends that you schedule a follow-up appointment in:   12 months with Dr. Izora Ribas, MD   Any Other Special Instructions Will Be Listed Below (If Applicable).     If you need a refill on your cardiac medications before your next appointment, please call your pharmacy.      Signed, Christell Constant, MD  12/19/2019 3:01 PM    [facility]

## 2019-12-19 NOTE — Patient Instructions (Signed)
Medication Instructions:  Your physician recommends that you continue on your current medications as directed. Please refer to the Current Medication list given to you today.  Labwork: None ordered.  Testing/Procedures: Your physician has requested that you have cardiac CT. Cardiac computed tomography (CT) is a painless test that uses an x-ray machine to take clear, detailed pictures of your heart. For further information please visit [email or web address] Please follow instruction sheet as given.   Follow-Up: Your physician recommends that you schedule a follow-up appointment in:   12 months with Dr. Izora Ribas, MD   Any Other Special Instructions Will Be Listed Below (If Applicable).     If you need a refill on your cardiac medications before your next appointment, please call your pharmacy.

## 2019-12-24 ENCOUNTER — Ambulatory Visit (INDEPENDENT_AMBULATORY_CARE_PROVIDER_SITE_OTHER): Payer: 59 | Admitting: Bariatrics

## 2020-01-02 ENCOUNTER — Other Ambulatory Visit: Payer: 59

## 2020-01-02 ENCOUNTER — Ambulatory Visit: Payer: 59

## 2020-01-10 ENCOUNTER — Ambulatory Visit (INDEPENDENT_AMBULATORY_CARE_PROVIDER_SITE_OTHER): Payer: 59 | Admitting: Bariatrics

## 2020-01-10 ENCOUNTER — Other Ambulatory Visit: Payer: Self-pay

## 2020-01-10 ENCOUNTER — Encounter (INDEPENDENT_AMBULATORY_CARE_PROVIDER_SITE_OTHER): Payer: Self-pay | Admitting: Bariatrics

## 2020-01-10 VITALS — BP 123/73 | HR 84 | Temp 97.8°F | Ht 64.0 in | Wt 187.0 lb

## 2020-01-10 DIAGNOSIS — E6609 Other obesity due to excess calories: Secondary | ICD-10-CM | POA: Diagnosis not present

## 2020-01-10 DIAGNOSIS — E559 Vitamin D deficiency, unspecified: Secondary | ICD-10-CM | POA: Diagnosis not present

## 2020-01-10 DIAGNOSIS — R7303 Prediabetes: Secondary | ICD-10-CM | POA: Diagnosis not present

## 2020-01-10 DIAGNOSIS — Z9189 Other specified personal risk factors, not elsewhere classified: Secondary | ICD-10-CM | POA: Diagnosis not present

## 2020-01-10 DIAGNOSIS — Z6832 Body mass index (BMI) 32.0-32.9, adult: Secondary | ICD-10-CM

## 2020-01-10 MED ORDER — VITAMIN D (ERGOCALCIFEROL) 1.25 MG (50000 UNIT) PO CAPS: 50000.0000 [IU] | ORAL_CAPSULE | ORAL | 0 refills | Status: AC

## 2020-01-14 ENCOUNTER — Other Ambulatory Visit: Payer: Self-pay

## 2020-01-14 ENCOUNTER — Ambulatory Visit (INDEPENDENT_AMBULATORY_CARE_PROVIDER_SITE_OTHER)
Admission: RE | Admit: 2020-01-14 | Discharge: 2020-01-14 | Disposition: A | Discharge status: Home / Self Care | Payer: Self-pay | Source: Ambulatory Visit | Attending: Internal Medicine | Admitting: Internal Medicine

## 2020-01-14 DIAGNOSIS — Z8249 Family history of ischemic heart disease and other diseases of the circulatory system: Secondary | ICD-10-CM

## 2020-01-15 NOTE — Progress Notes (Unsigned)
Chief Complaint:   OBESITY Melissa Roth is here to discuss her progress with her obesity treatment plan along with follow-up of her obesity related diagnoses. Melissa Roth is on the Category 1 Plan and states she is following her eating plan approximately 50% of the time. Melissa Roth states she is not exercising regularly at this time.  Today's visit was #: 4 Starting weight: 188 lbs Starting date: 11/05/2019 Today's weight: 187 lbs Today's date: 01/10/2019 Total lbs lost to date: 1 lb Total lbs lost since last in-office visit: 0  Interim History: Melissa Roth's weight remains the same as at her last visit.  She is doing well with her water and protein.  Subjective:   1. Vitamin D deficiency Denene's Vitamin D level was 40.9 on 11/05/2019. She is currently taking prescription vitamin D 50,000 IU each week. She denies nausea, vomiting or muscle weakness.  2. Prediabetes Melissa Roth has a diagnosis of prediabetes based on her elevated HgA1c and was informed this puts her at greater risk of developing diabetes. She continues to work on diet and exercise to decrease her risk of diabetes. She denies nausea or hypoglycemia.  She is on no medication for this.  Lab Results  Component Value Date   HGBA1C 5.7 (H) 11/05/2019   Lab Results  Component Value Date   INSULIN 8.9 11/05/2019   3. At risk for osteoporosis Melissa Roth is at higher risk of osteopenia and osteoporosis due to Vitamin D deficiency.   Assessment/Plan:   1. Vitamin D deficiency Low Vitamin D level contributes to fatigue and are associated with obesity, breast, and colon cancer. She agrees to continue to take prescription Vitamin D @50 ,000 IU every week and will follow-up for routine testing of Vitamin D, at least 2-3 times per year to avoid over-replacement.  -Refill Vitamin D, Ergocalciferol, (DRISDOL) 1.25 MG (50000 UNIT) CAPS capsule; Take 1 capsule (50,000 Units total) by mouth every 7 (seven) days.  Dispense: 5 capsule; Refill: 0  2.  Prediabetes Melissa Roth will continue to work on weight loss, exercise, and decreasing simple carbohydrates to help decrease the risk of diabetes.   3. At risk for osteoporosis Melissa Roth was given approximately 15 minutes of osteoporosis prevention counseling today. Melissa Roth is at risk for osteopenia and osteoporosis due to her Vitamin D deficiency. She was encouraged to take her Vitamin D and follow her higher calcium diet and increase strengthening exercise to help strengthen her bones and decrease her risk of osteopenia and osteoporosis.  Repetitive spaced learning was employed today to elicit superior memory formation and behavioral change.  4. Class 1 obesity due to excess calories with serious comorbidity and body mass index (BMI) of 32.0 to 32.9 in adult  Melissa Roth is currently in the action stage of change. As such, her goal is to continue with weight loss efforts. She has agreed to the Category 1 Plan.   She will be more adherent to the plan, practice mindful eating, and increase her water intake.  She will also minimize any plan skipping.  Exercise goals: All adults should avoid inactivity. Some physical activity is better than none, and adults who participate in any amount of physical activity gain some health benefits.  Behavioral modification strategies: increasing lean protein intake, decreasing simple carbohydrates, increasing vegetables, increasing water intake, decreasing eating out, no skipping meals, meal planning and cooking strategies, keeping healthy foods in the home and planning for success.  Melissa Roth has agreed to follow-up with our clinic in 2 weeks. She was informed of the importance  of frequent follow-up visits to maximize her success with intensive lifestyle modifications for her multiple health conditions.   Objective:   Blood pressure 123/73, pulse 84, temperature 97.8 F (36.6 C), temperature source Oral, height 5\' 4"  (1.626 m), weight 187 lb (84.8 kg), SpO2 98 %. Body mass index  is 32.1 kg/m.  General: Cooperative, alert, well developed, in no acute distress. HEENT: Conjunctivae and lids unremarkable. Cardiovascular: Regular rhythm.  Lungs: Normal work of breathing. Neurologic: No focal deficits.   Lab Results  Component Value Date   CREATININE 0.69 11/05/2019   BUN 10 11/05/2019   NA 139 11/05/2019   K 4.2 11/05/2019   CL 103 11/05/2019   CO2 27 11/05/2019   Lab Results  Component Value Date   ALT 40 (H) 11/05/2019   AST 32 11/05/2019   ALKPHOS 109 11/05/2019   BILITOT 0.2 11/05/2019   Lab Results  Component Value Date   HGBA1C 5.7 (H) 11/05/2019   Lab Results  Component Value Date   INSULIN 8.9 11/05/2019   Lab Results  Component Value Date   TSH 3.310 11/05/2019   Lab Results  Component Value Date   CHOL 155 11/05/2019   HDL 84 11/05/2019   LDLCALC 57 11/05/2019   TRIG 71 11/05/2019   Lab Results  Component Value Date   WBC 5.3 06/06/2017   HGB 12.6 06/06/2017   HCT 39.6 06/06/2017   MCV 95.7 06/06/2017   PLT 285 06/06/2017   Attestation Statements:   Reviewed by clinician on day of visit: allergies, medications, problem list, medical history, surgical history, family history, social history, and previous encounter notes.  I, 08/06/2017, CMA, am acting as Insurance claims handler for Energy manager, DO  I have reviewed the above documentation for accuracy and completeness, and I agree with the above. [employer], DO

## 2020-01-16 ENCOUNTER — Encounter (INDEPENDENT_AMBULATORY_CARE_PROVIDER_SITE_OTHER): Payer: Self-pay | Admitting: Bariatrics

## 2020-01-31 ENCOUNTER — Ambulatory Visit (INDEPENDENT_AMBULATORY_CARE_PROVIDER_SITE_OTHER): Payer: 59 | Admitting: Bariatrics

## 2020-02-05 ENCOUNTER — Other Ambulatory Visit (INDEPENDENT_AMBULATORY_CARE_PROVIDER_SITE_OTHER): Payer: Self-pay | Admitting: Bariatrics

## 2020-02-05 DIAGNOSIS — E559 Vitamin D deficiency, unspecified: Secondary | ICD-10-CM

## 2020-02-05 NOTE — Telephone Encounter (Signed)
Dr.Brown 

## 2020-02-07 ENCOUNTER — Ambulatory Visit
Admission: RE | Admit: 2020-02-07 | Discharge: 2020-02-07 | Disposition: A | Discharge status: Home / Self Care | Payer: 59 | Source: Ambulatory Visit | Attending: Obstetrics and Gynecology | Admitting: Obstetrics and Gynecology

## 2020-02-07 ENCOUNTER — Other Ambulatory Visit: Payer: Self-pay

## 2020-02-07 DIAGNOSIS — Z1231 Encounter for screening mammogram for malignant neoplasm of breast: Secondary | ICD-10-CM

## 2020-03-27 ENCOUNTER — Telehealth: Payer: Self-pay

## 2020-03-27 ENCOUNTER — Other Ambulatory Visit: Payer: Self-pay | Admitting: Physical Medicine and Rehabilitation

## 2020-03-27 DIAGNOSIS — M5412 Radiculopathy, cervical region: Secondary | ICD-10-CM

## 2020-03-27 DIAGNOSIS — M5416 Radiculopathy, lumbar region: Secondary | ICD-10-CM

## 2020-03-27 MED ORDER — PREDNISONE 50 MG PO TABS: ORAL_TABLET | ORAL | 0 refills | Status: DC

## 2020-03-27 NOTE — Telephone Encounter (Signed)
Phone call to patient to review instructions for 13 hr prep for CT w/ contrast on 03/31/20  at 1330. Prescription called into [facility]. Pt aware and verbalized understanding of instructions. Prescription:   Pt to take 50 mg of prednisone on 03/31/20 at 0030, 50 mg of prednisone on 03/31/20 at 0630, and 50 mg of prednisone on 03/31/20 at 1230. Pt is also to take 50 mg of benadryl on 03/31/20 at 1230. Please call [phone] with any questions.

## 2020-03-31 ENCOUNTER — Ambulatory Visit
Admission: RE | Admit: 2020-03-31 | Discharge: 2020-03-31 | Disposition: A | Discharge status: Home / Self Care | Payer: 59 | Source: Ambulatory Visit | Attending: Physical Medicine and Rehabilitation | Admitting: Physical Medicine and Rehabilitation

## 2020-03-31 ENCOUNTER — Other Ambulatory Visit: Payer: Self-pay

## 2020-03-31 DIAGNOSIS — M5412 Radiculopathy, cervical region: Secondary | ICD-10-CM

## 2020-03-31 MED ORDER — TRIAMCINOLONE ACETONIDE 40 MG/ML IJ SUSP (RADIOLOGY)
60.0000 mg | Freq: Once | INTRAMUSCULAR | Status: AC
  Administered 2020-03-31: 60 mg via EPIDURAL

## 2020-03-31 MED ORDER — PREDNISONE 50 MG PO TABS: ORAL_TABLET | ORAL | 0 refills | Status: DC

## 2020-03-31 MED ORDER — IOPAMIDOL (ISOVUE-M 300) INJECTION 61%
15.0000 mL | Freq: Once | INTRAMUSCULAR | Status: AC | PRN
  Administered 2020-03-31: 15 mL via INTRATHECAL

## 2020-03-31 MED ORDER — DIPHENHYDRAMINE HCL 50 MG PO CAPS: 50.0000 mg | ORAL_CAPSULE | Freq: Once | ORAL | Status: DC

## 2020-03-31 NOTE — Progress Notes (Addendum)
Spoke to patient and reviewed instructions for 13 hr prep for injection with CT contrast on 04/17/20 at 7:30 AM. Prescription called into [facility]. Pt aware and verbalized understanding of instructions. Prescription:  Pt to take 50 mg of prednisone on 04/16/20 at 6:30 PM, 50 mg of prednisone on 04/17/20 at 12:30 AM, and 50 mg of prednisone on 04/17/20 at 6:30 AM. Pt is also to take 50 mg of benadryl on 04/17/20 at 6:30 AM. Please call [phone] with any questions.

## 2020-03-31 NOTE — Discharge Instructions (Signed)

## 2020-04-17 ENCOUNTER — Other Ambulatory Visit: Payer: Self-pay | Admitting: Physical Medicine and Rehabilitation

## 2020-04-17 ENCOUNTER — Other Ambulatory Visit: Payer: Self-pay

## 2020-04-17 ENCOUNTER — Ambulatory Visit
Admission: RE | Admit: 2020-04-17 | Discharge: 2020-04-17 | Disposition: A | Discharge status: Home / Self Care | Payer: 59 | Source: Ambulatory Visit | Attending: Physical Medicine and Rehabilitation | Admitting: Physical Medicine and Rehabilitation

## 2020-04-17 DIAGNOSIS — M5416 Radiculopathy, lumbar region: Secondary | ICD-10-CM

## 2020-04-17 MED ORDER — METHYLPREDNISOLONE ACETATE 40 MG/ML INJ SUSP (RADIOLOG
80.0000 mg | Freq: Once | INTRAMUSCULAR | Status: AC
  Administered 2020-04-17: 80 mg via EPIDURAL

## 2020-04-17 MED ORDER — IOPAMIDOL (ISOVUE-M 200) INJECTION 41%
1.0000 mL | Freq: Once | INTRAMUSCULAR | Status: AC
  Administered 2020-04-17: 1 mL via EPIDURAL

## 2020-04-17 NOTE — Discharge Instructions (Signed)

## 2020-07-22 ENCOUNTER — Other Ambulatory Visit: Payer: Self-pay

## 2020-07-22 ENCOUNTER — Ambulatory Visit (INDEPENDENT_AMBULATORY_CARE_PROVIDER_SITE_OTHER): Payer: 59 | Admitting: Podiatry

## 2020-07-22 ENCOUNTER — Ambulatory Visit (INDEPENDENT_AMBULATORY_CARE_PROVIDER_SITE_OTHER): Payer: 59

## 2020-07-22 ENCOUNTER — Encounter: Payer: Self-pay | Admitting: Podiatry

## 2020-07-22 DIAGNOSIS — M722 Plantar fascial fibromatosis: Secondary | ICD-10-CM | POA: Diagnosis not present

## 2020-07-22 DIAGNOSIS — M21619 Bunion of unspecified foot: Secondary | ICD-10-CM | POA: Diagnosis not present

## 2020-07-22 MED ORDER — TRIAMCINOLONE ACETONIDE 10 MG/ML IJ SUSP
10.0000 mg | Freq: Once | INTRAMUSCULAR | Status: AC
  Administered 2020-07-22: 10 mg

## 2020-07-22 MED ORDER — MELOXICAM 15 MG PO TABS: 15.0000 mg | ORAL_TABLET | Freq: Every day | ORAL | 0 refills | Status: AC

## 2020-07-22 NOTE — Patient Instructions (Signed)
For instructions on how to put on your Night Splint, please visit [email or web address]   Plantar Fasciitis (Heel Spur Syndrome) with Rehab The plantar fascia is a fibrous, ligament-like, soft-tissue structure that spans the bottom of the foot. Plantar fasciitis is a condition that causes pain in the foot due to inflammation of the tissue. SYMPTOMS   Pain and tenderness on the underneath side of the foot.  Pain that worsens with standing or walking. CAUSES  Plantar fasciitis is caused by irritation and injury to the plantar fascia on the underneath side of the foot. Common mechanisms of injury include:  Direct trauma to bottom of the foot.  Damage to a small nerve that runs under the foot where the main fascia attaches to the heel bone.  Stress placed on the plantar fascia due to bone spurs. RISK INCREASES WITH:   Activities that place stress on the plantar fascia (running, jumping, pivoting, or cutting).  Poor strength and flexibility.  Improperly fitted shoes.  Tight calf muscles.  Flat feet.  Failure to warm-up properly before activity.  Obesity. PREVENTION  Warm up and stretch properly before activity.  Allow for adequate recovery between workouts.  Maintain physical fitness:  Strength, flexibility, and endurance.  Cardiovascular fitness.  Maintain a health body weight.  Avoid stress on the plantar fascia.  Wear properly fitted shoes, including arch supports for individuals who have flat feet.  PROGNOSIS  If treated properly, then the symptoms of plantar fasciitis usually resolve without surgery. However, occasionally surgery is necessary.  RELATED COMPLICATIONS   Recurrent symptoms that may result in a chronic condition.  Problems of the lower back that are caused by compensating for the injury, such as limping.  Pain or weakness of the foot during push-off following surgery.  Chronic inflammation, scarring, and partial or complete fascia tear,  occurring more often from repeated injections.  TREATMENT  Treatment initially involves the use of ice and medication to help reduce pain and inflammation. The use of strengthening and stretching exercises may help reduce pain with activity, especially stretches of the Achilles tendon. These exercises may be performed at home or with a therapist. Your caregiver may recommend that you use heel cups of arch supports to help reduce stress on the plantar fascia. Occasionally, corticosteroid injections are given to reduce inflammation. If symptoms persist for greater than 6 months despite non-surgical (conservative), then surgery may be recommended.   MEDICATION   If pain medication is necessary, then nonsteroidal anti-inflammatory medications, such as aspirin and ibuprofen, or other minor pain relievers, such as acetaminophen, are often recommended.  Do not take pain medication within 7 days before surgery.  Prescription pain relievers may be given if deemed necessary by your caregiver. Use only as directed and only as much as you need.  Corticosteroid injections may be given by your caregiver. These injections should be reserved for the most serious cases, because they may only be given a certain number of times.  HEAT AND COLD  Cold treatment (icing) relieves pain and reduces inflammation. Cold treatment should be applied for 10 to 15 minutes every 2 to 3 hours for inflammation and pain and immediately after any activity that aggravates your symptoms. Use ice packs or massage the area with a piece of ice (ice massage).  Heat treatment may be used prior to performing the stretching and strengthening activities prescribed by your caregiver, physical therapist, or athletic trainer. Use a heat pack or soak the injury in warm water.  SEEK IMMEDIATE MEDICAL CARE   IF:  Treatment seems to offer no benefit, or the condition worsens.  Any medications produce adverse side effects.  EXERCISES- RANGE OF  MOTION (ROM) AND STRETCHING EXERCISES - Plantar Fasciitis (Heel Spur Syndrome) These exercises may help you when beginning to rehabilitate your injury. Your symptoms may resolve with or without further involvement from your physician, physical therapist or athletic trainer. While completing these exercises, remember:   Restoring tissue flexibility helps normal motion to return to the joints. This allows healthier, less painful movement and activity.  An effective stretch should be held for at least 30 seconds.  A stretch should never be painful. You should only feel a gentle lengthening or release in the stretched tissue.  RANGE OF MOTION - Toe Extension, Flexion  Sit with your right / left leg crossed over your opposite knee.  Grasp your toes and gently pull them back toward the top of your foot. You should feel a stretch on the bottom of your toes and/or foot.  Hold this stretch for 10 seconds.  Now, gently pull your toes toward the bottom of your foot. You should feel a stretch on the top of your toes and or foot.  Hold this stretch for 10 seconds. Repeat  times. Complete this stretch 3 times per day.   RANGE OF MOTION - Ankle Dorsiflexion, Active Assisted  Remove shoes and sit on a chair that is preferably not on a carpeted surface.  Place right / left foot under knee. Extend your opposite leg for support.  Keeping your heel down, slide your right / left foot back toward the chair until you feel a stretch at your ankle or calf. If you do not feel a stretch, slide your bottom forward to the edge of the chair, while still keeping your heel down.  Hold this stretch for 10 seconds. Repeat 3 times. Complete this stretch 2 times per day.   STRETCH  Gastroc, Standing  Place hands on wall.  Extend right / left leg, keeping the front knee somewhat bent.  Slightly point your toes inward on your back foot.  Keeping your right / left heel on the floor and your knee straight, shift  your weight toward the wall, not allowing your back to arch.  You should feel a gentle stretch in the right / left calf. Hold this position for 10 seconds. Repeat 3 times. Complete this stretch 2 times per day.  STRETCH  Soleus, Standing  Place hands on wall.  Extend right / left leg, keeping the other knee somewhat bent.  Slightly point your toes inward on your back foot.  Keep your right / left heel on the floor, bend your back knee, and slightly shift your weight over the back leg so that you feel a gentle stretch deep in your back calf.  Hold this position for 10 seconds. Repeat 3 times. Complete this stretch 2 times per day.  STRETCH  Gastrocsoleus, Standing  Note: This exercise can place a lot of stress on your foot and ankle. Please complete this exercise only if specifically instructed by your caregiver.   Place the ball of your right / left foot on a step, keeping your other foot firmly on the same step.  Hold on to the wall or a rail for balance.  Slowly lift your other foot, allowing your body weight to press your heel down over the edge of the step.  You should feel a stretch in your right / left calf.  Hold this position   for 10 seconds.  Repeat this exercise with a slight bend in your right / left knee. Repeat 3 times. Complete this stretch 2 times per day.   STRENGTHENING EXERCISES - Plantar Fasciitis (Heel Spur Syndrome)  These exercises may help you when beginning to rehabilitate your injury. They may resolve your symptoms with or without further involvement from your physician, physical therapist or athletic trainer. While completing these exercises, remember:   Muscles can gain both the endurance and the strength needed for everyday activities through controlled exercises.  Complete these exercises as instructed by your physician, physical therapist or athletic trainer. Progress the resistance and repetitions only as guided.  STRENGTH - Towel Curls  Sit in  a chair positioned on a non-carpeted surface.  Place your foot on a towel, keeping your heel on the floor.  Pull the towel toward your heel by only curling your toes. Keep your heel on the floor. Repeat 3 times. Complete this exercise 2 times per day.  STRENGTH - Ankle Inversion  Secure one end of a rubber exercise band/tubing to a fixed object (table, pole). Loop the other end around your foot just before your toes.  Place your fists between your knees. This will focus your strengthening at your ankle.  Slowly, pull your big toe up and in, making sure the band/tubing is positioned to resist the entire motion.  Hold this position for 10 seconds.  Have your muscles resist the band/tubing as it slowly pulls your foot back to the starting position. Repeat 3 times. Complete this exercises 2 times per day.  Document Released: 12/21/2004 Document Revised: 03/15/2011 Document Reviewed: 04/04/2008 [employer] Patient Information 2014 [employer].  

## 2020-07-26 NOTE — Progress Notes (Signed)
Subjective: 60 year old female presents the office today for concerns of reoccurrence of pain in the bottom of both of her heels over the last 4 months.  She states that she notices a pulling sensation worse with walking at times.  She was on meloxicam which did seem to help.  No recent injury or trauma.  She does get occasional discomfort of the right big toe mostly at nighttime it throbs.  Again no injury.  No other concerns. Denies any systemic complaints such as fevers, chills, nausea, vomiting. No acute changes since last appointment, and no other complaints at this time.   Objective: AAO x3, NAD DP/PT pulses palpable bilaterally, CRT less than 3 seconds Bunion present on the right foot causing discomfort.  No edema, erythema.   Tenderness to palpation along the plantar medial tubercle of the calcaneus at the insertion of plantar fascia on the left and right foot. There is no pain along the course of the plantar fascia within the arch of the foot. Plantar fascia appears to be intact. There is no pain with lateral compression of the calcaneus or pain with vibratory sensation. There is no pain along the course or insertion of the achilles tendon. No other areas of tenderness to bilateral lower extremities.No pain with calf compression, swelling, warmth, erythema  Assessment: Right foot bunion, bilateral plan fasciitis  Plan: -All treatment options discussed with the patient including all alternatives, risks, complications.  -X-rays obtained reviewed.  No evidence of acute fracture.  Bunions present. -For the bunion discussed offloading, shoe modifications.  She can use Voltaren gel or anti-inflammatories as needed. -Steroid injection performed.  See procedure note below. -Stretching, icing daily.  Discussion modifications, arch supports. -Refill meloxicam. -Patient encouraged to call the office with any questions, concerns, change in symptoms.   Procedure: Injection Tendon/Ligament Discussed  alternatives, risks, complications and verbal consent was obtained.  Location: Bilateral plantar fascia at the glabrous junction; medial approach. Skin Prep: Alcohol. Injectate: 0.5cc 0.5% marcaine plain, 0.5 cc 2% lidocaine plain and, 1 cc kenalog 10. Disposition: Patient tolerated procedure well. Injection site dressed with a band-aid.  Post-injection care was discussed and return precautions discussed.   Return in about 4 weeks (around 08/19/2020) for heel pain .  Melissa Roth DPM

## 2020-08-12 ENCOUNTER — Other Ambulatory Visit: Payer: Self-pay | Admitting: Family Medicine

## 2020-08-12 DIAGNOSIS — N644 Mastodynia: Secondary | ICD-10-CM

## 2020-08-23 ENCOUNTER — Other Ambulatory Visit: Payer: Self-pay

## 2020-08-23 ENCOUNTER — Ambulatory Visit: Payer: 59

## 2020-08-23 ENCOUNTER — Ambulatory Visit
Admission: RE | Admit: 2020-08-23 | Discharge: 2020-08-23 | Disposition: A | Discharge status: Home / Self Care | Payer: 59 | Source: Ambulatory Visit | Attending: Family Medicine | Admitting: Family Medicine

## 2020-08-23 DIAGNOSIS — N644 Mastodynia: Secondary | ICD-10-CM

## 2020-08-25 ENCOUNTER — Ambulatory Visit (INDEPENDENT_AMBULATORY_CARE_PROVIDER_SITE_OTHER): Payer: 59 | Admitting: Podiatry

## 2020-08-25 ENCOUNTER — Encounter: Payer: Self-pay | Admitting: Podiatry

## 2020-08-25 ENCOUNTER — Other Ambulatory Visit: Payer: Self-pay

## 2020-08-25 DIAGNOSIS — M21619 Bunion of unspecified foot: Secondary | ICD-10-CM

## 2020-08-25 DIAGNOSIS — M722 Plantar fascial fibromatosis: Secondary | ICD-10-CM

## 2020-08-25 NOTE — Patient Instructions (Signed)

## 2020-09-01 NOTE — Progress Notes (Signed)
Subjective: 60 year old female presents the office today for for follow evaluation of heel pain, plantar fasciitis.  She states that overall she is doing about 99% better.  Injections were helpful and she had no complications.  The meloxicam is also been helpful.  Gets occasional discomfort of the big toe, bunion the left foot.  No other areas of discomfort.  No recent injury or trauma or changes.    Objective: AAO x3, NAD DP/PT pulses palpable bilaterally, CRT less than 3 seconds Bunion present on the left foot causing occasional discomfort.  No edema, erythema.   There is no significant tenderness palpation on plantar medial tubercle of the Insertion of plantar fascia on the left and minimal on the right.  There is no edema, erythema.  No pain with lateral compression of calcaneus.  No pain with Achilles tendon.  No other areas of discomfort.  MMT 5/5.  Assessment: Right foot bunion, bilateral plan fasciitis  Plan: -All treatment options discussed with the patient including all alternatives, risks, complications.  -In regards to the bunion as well as Planter fasciitis we discussed shoe modifications to help give support also offload for the bunion.  Anti-inflammatories as needed.  Continue general stretching, icing exercises.  We previously discussed surgical intervention for the bunion if needed but would continue with conservative care.  Return if symptoms worsen or fail to improve.  [name] DPM

## 2020-10-01 ENCOUNTER — Other Ambulatory Visit: Payer: Self-pay | Admitting: Chiropractic Medicine

## 2020-10-01 DIAGNOSIS — M5416 Radiculopathy, lumbar region: Secondary | ICD-10-CM

## 2020-10-06 ENCOUNTER — Telehealth: Payer: Self-pay

## 2020-10-06 MED ORDER — PREDNISONE 50 MG PO TABS: ORAL_TABLET | ORAL | 0 refills | Status: DC

## 2020-10-06 NOTE — Telephone Encounter (Signed)
Phone call to patient to review instructions for 13 hr prep for NRB w/ contrast on 10/20/20 @ 8:00 AM Prescription called into [facility]. Pt aware and verbalized understanding of instructions. Prescription:  Pt to take 50 mg of prednisone on 10/19/20 at 7:00 PM, 50 mg of prednisone on 10/20/20 at 1:00 AM, and 50 mg of prednisone on 10/20/20 at 7:00 AM. Pt is also to take 50 mg of benadryl on 10/20/20 at 7:00 AM. Please call [phone] with any questions.   Pt did not want benadryl called in as a prescription as she reports she has the medication at home and understands when to take it. Pt advised not to drive the day of taking this medication as it may cause drowsiness. Pt verbalized understanding.

## 2020-10-08 ENCOUNTER — Other Ambulatory Visit: Payer: Self-pay | Admitting: Chiropractic Medicine

## 2020-10-08 ENCOUNTER — Other Ambulatory Visit: Payer: Self-pay | Admitting: Physical Medicine and Rehabilitation

## 2020-10-08 DIAGNOSIS — G8929 Other chronic pain: Secondary | ICD-10-CM

## 2020-10-08 DIAGNOSIS — M542 Cervicalgia: Secondary | ICD-10-CM

## 2020-10-08 DIAGNOSIS — M5410 Radiculopathy, site unspecified: Secondary | ICD-10-CM

## 2020-10-08 DIAGNOSIS — M545 Low back pain, unspecified: Secondary | ICD-10-CM

## 2020-10-08 DIAGNOSIS — Z9889 Other specified postprocedural states: Secondary | ICD-10-CM

## 2020-10-14 ENCOUNTER — Ambulatory Visit (INDEPENDENT_AMBULATORY_CARE_PROVIDER_SITE_OTHER): Payer: 59 | Admitting: Podiatry

## 2020-10-14 ENCOUNTER — Other Ambulatory Visit: Payer: Self-pay

## 2020-10-14 DIAGNOSIS — M21619 Bunion of unspecified foot: Secondary | ICD-10-CM

## 2020-10-14 DIAGNOSIS — M722 Plantar fascial fibromatosis: Secondary | ICD-10-CM

## 2020-10-14 NOTE — Patient Instructions (Signed)

## 2020-10-18 NOTE — Progress Notes (Signed)
Subjective: 60 year old female presents the office today for for follow evaluation of heel pain, plantar fasciitis.  She states that she has been trying to help her mom and not been taking care of her feet and over the last 2 to 3 weeks she has increased pain to the heels and she is requesting steroid injections today.  No recent injury or changes.  Objective: AAO x3, NAD DP/PT pulses palpable bilaterally, CRT less than 3 seconds There is recurrence of tenderness palpation on plantar medial tubercle of the Insertion of plantar fascia bilaterally.  There is no pain with lateral compression of calcaneus.  There is no pain with Achilles tendon.  No other areas of pinpoint tenderness.  Bunion present left foot which occasionally causes discomfort.  No second pain today.  MMT 5/5.  Negative Tinel sign.  No pain with calf compression, erythema or warmth  Assessment: Right foot bunion, bilateral plantar fasciitis  Plan: -All treatment options discussed with the patient including all alternatives, risks, complications.  -Steroid injections performed bilaterally.  See procedure note below.  Encouraged her to get back to stretching, icing daily.  Shoe modifications good arch support.  Use the braces as well. -Offloading for the bunion.  Discussed shoe modifications for this as well.  Procedure: Injection Tendon/Ligament Discussed alternatives, risks, complications and verbal consent was obtained.  Location: Bilateral plantar fascia at the glabrous junction; medial approach. Skin Prep: Alcohol. Injectate: 0.5cc 0.5% marcaine plain, 0.5 cc 2% lidocaine plain and, 1 cc kenalog 10. Disposition: Patient tolerated procedure well. Injection site dressed with a band-aid.  Post-injection care was discussed and return precautions discussed.    No follow-ups on file.  [name] DPM

## 2020-10-20 ENCOUNTER — Other Ambulatory Visit: Payer: Self-pay

## 2020-10-20 ENCOUNTER — Other Ambulatory Visit: Payer: Self-pay | Admitting: Chiropractic Medicine

## 2020-10-20 ENCOUNTER — Ambulatory Visit
Admission: RE | Admit: 2020-10-20 | Discharge: 2020-10-20 | Disposition: A | Discharge status: Home / Self Care | Payer: 59 | Source: Ambulatory Visit | Attending: Chiropractic Medicine | Admitting: Chiropractic Medicine

## 2020-10-20 DIAGNOSIS — M5416 Radiculopathy, lumbar region: Secondary | ICD-10-CM

## 2020-10-20 MED ORDER — IOPAMIDOL (ISOVUE-M 200) INJECTION 41%
1.0000 mL | Freq: Once | INTRAMUSCULAR | Status: AC
  Administered 2020-10-20: 1 mL via EPIDURAL

## 2020-10-20 MED ORDER — METHYLPREDNISOLONE ACETATE 40 MG/ML INJ SUSP (RADIOLOG
80.0000 mg | Freq: Once | INTRAMUSCULAR | Status: AC
  Administered 2020-10-20: 80 mg via EPIDURAL

## 2020-10-20 NOTE — Discharge Instructions (Signed)

## 2020-10-22 ENCOUNTER — Inpatient Hospital Stay: Admission: RE | Admit: 2020-10-22 | Payer: 59 | Source: Ambulatory Visit

## 2020-10-28 ENCOUNTER — Other Ambulatory Visit: Payer: Self-pay | Admitting: Chiropractic Medicine

## 2020-10-28 DIAGNOSIS — M5412 Radiculopathy, cervical region: Secondary | ICD-10-CM

## 2020-11-17 ENCOUNTER — Ambulatory Visit: Payer: 59 | Admitting: Podiatry

## 2020-12-01 ENCOUNTER — Telehealth: Payer: Self-pay

## 2020-12-01 MED ORDER — DIPHENHYDRAMINE HCL 50 MG PO TABS: 50.0000 mg | ORAL_TABLET | Freq: Once | ORAL | 0 refills | Status: AC

## 2020-12-01 MED ORDER — PREDNISONE 50 MG PO TABS: ORAL_TABLET | ORAL | 0 refills | Status: DC

## 2020-12-01 NOTE — Telephone Encounter (Signed)
Phone call to patient to review instructions for 13 hr prep for Injection w/ contrast on 12/05/20 at 8 AM. Prescription called into [facility]. Pt aware and verbalized understanding of instructions. Prescription: Pt to take 50 mg of prednisone on 12/04/20 at 7 pm, 50 mg of prednisone on 12/05/20 at 1 AM, and 50 mg of prednisone on 12/05/20 at 7 AM. Pt is also to take 50 mg of benadryl on 12/05/20 at 7 AM. Please call [phone] with any questions.  Benadryl was also called in as a prescription, per the patients request. Advised pt to not drive the day of taking these medications. Pt verbalized understanding.

## 2020-12-04 ENCOUNTER — Ambulatory Visit: Payer: 59 | Admitting: Podiatry

## 2020-12-05 ENCOUNTER — Ambulatory Visit
Admission: RE | Admit: 2020-12-05 | Discharge: 2020-12-05 | Disposition: A | Discharge status: Home / Self Care | Payer: 59 | Source: Ambulatory Visit | Attending: Chiropractic Medicine | Admitting: Chiropractic Medicine

## 2020-12-05 ENCOUNTER — Other Ambulatory Visit: Payer: Self-pay

## 2020-12-05 DIAGNOSIS — M5412 Radiculopathy, cervical region: Secondary | ICD-10-CM

## 2020-12-05 MED ORDER — TRIAMCINOLONE ACETONIDE 40 MG/ML IJ SUSP (RADIOLOGY)
60.0000 mg | Freq: Once | INTRAMUSCULAR | Status: AC
  Administered 2020-12-05: 60 mg via EPIDURAL

## 2020-12-05 MED ORDER — IOPAMIDOL (ISOVUE-M 300) INJECTION 61%
1.0000 mL | Freq: Once | INTRAMUSCULAR | Status: AC
  Administered 2020-12-05: 1 mL via EPIDURAL

## 2020-12-05 NOTE — Discharge Instructions (Signed)

## 2020-12-18 NOTE — Progress Notes (Signed)
Cardiology Office Note:    Date:  12/19/2020   ID:  Melissa Roth, DOB 22-Jul-1960, MRN [id number]  PCP:  Jarrett Soho, PA-C  [facility] Cardiologist:  Christell Constant, MD  [facility] Electrophysiologist:  None   CC: Prevention follow up  History of Present Illness:    Melissa Roth is a 60 y.o. female  Obesity, who presents for evaluation. In interim of this visit, patient had a calcium score of 0.  Seen 12/19/20.  Patient notes that she is doing great.  She has labs required for work and had an increased waist circumference, elevated MPO, and elevated CRP.  LDL has dropped to 46.  She comes with questions about what to do .    No chest pain or pressure .  No SOB/DOE and no PND/Orthopnea.  No weight gain or leg swelling.  No palpitations or syncope .  Leg pain from prior has resolved.   Past Medical History:  Diagnosis Date   Back pain    Cervical disc disorder with radiculopathy of cervical region    PONV (postoperative nausea and vomiting)    Sciatica    Vitamin D deficiency     Past Surgical History:  Procedure Laterality Date   ABDOMINAL HYSTERECTOMY     CHOLECYSTECTOMY N/A 06/07/2017   Procedure: LAPAROSCOPIC CHOLECYSTECTOMY;  Surgeon: Jimmye Norman, MD;  Location: [facility] OR;  Service: General;  Laterality: N/A;   FOOT SURGERY     LUMBAR LAMINECTOMY/DECOMPRESSION MICRODISCECTOMY Left 11/10/2016   Procedure: Left L5-S1 discectomy;  Surgeon: Venita Lick, MD;  Location: [facility] OR;  Service: Orthopedics;  Laterality: Left;  120 mins    Current Medications: Current Meds  Medication Sig   baclofen (LIORESAL) 10 MG tablet as needed.   benzonatate (TESSALON) 100 MG capsule Take 100 mg by mouth as needed.   buPROPion (WELLBUTRIN XL) 150 MG 24 hr tablet Take 150 mg by mouth as needed.   diclofenac (VOLTAREN) 75 MG EC tablet as needed.   estradiol (ESTRACE) 0.1 MG/GM vaginal cream as needed.   fluconazole (DIFLUCAN) 150 MG tablet Take by mouth as needed.    gabapentin (NEURONTIN) 300 MG capsule Take 300 mg by mouth as needed.   HYDROcodone-acetaminophen (NORCO/VICODIN) 5-325 MG tablet as needed.   ipratropium (ATROVENT) 0.03 % nasal spray as needed.   meloxicam (MOBIC) 15 MG tablet Take 1 tablet (15 mg total) by mouth daily. (Patient taking differently: Take 15 mg by mouth as needed.)   methocarbamol (ROBAXIN) 500 MG tablet as needed.   Multiple Vitamins-Minerals (MULTIVITAMIN WITH MINERALS) tablet Take 1 tablet by mouth daily.   predniSONE (DELTASONE) 50 MG tablet Take Prednisone 50 mg by mouth on 12/07/19 at 12:30a.m., 6:30a.m. and 12:30p.m.; take Benadryl 50 mg by mouth on 12/07/19 at 12:30p.m. (Patient taking differently: as needed. Take Prednisone 50 mg by mouth on 12/07/19 at 12:30a.m., 6:30a.m. and 12:30p.m.; take Benadryl 50 mg by mouth on 12/07/19 at 12:30p.m.)   thiamine (VITAMIN B-1) 100 MG tablet Take 100 mg by mouth daily.   valACYclovir (VALTREX) 500 MG tablet as needed.   Vitamin D, Ergocalciferol, (DRISDOL) 1.25 MG (50000 UNIT) CAPS capsule Take 1 capsule (50,000 Units total) by mouth every 7 (seven) days.     Allergies:   Iodinated diagnostic agents and Sulfa antibiotics   Social History   Socioeconomic History   Marital status: Widowed    Spouse name: Not on file   Number of children: Not on file   Years of education: Not on  file   Highest education level: Not on file  Occupational History   Occupation: Scientist, research (physical sciences) Rep  Tobacco Use   Smoking status: Never   Smokeless tobacco: Never  Vaping Use   Vaping Use: Never used  Substance and Sexual Activity   Alcohol use: Yes    Comment: occ   Drug use: No   Sexual activity: Not on file  Other Topics Concern   Not on file  Social History Narrative   Not on file   Social Determinants of Health   Financial Resource Strain: Not on file  Food Insecurity: Not on file  Transportation Needs: Not on file  Physical Activity: Not on file  Stress: Not on file  Social  Connections: Not on file    Social:  Husband died last year from Covid Mother is battle breast cancer Client service rep for [employer]  Family History: The patient's family history includes Alcoholism in her father; Heart attack in her father; Hyperlipidemia in her brother, father, and mother; Hypertension in her brother, father, and mother; Obesity in her mother; Sleep apnea in her mother; Sudden death in her father. There is no history of Diabetes.  History of coronary artery disease notable for father (has HTN). History of heart failure notable for father. No history of cardiomyopathies including hypertrophic cardiomyopathy, left ventricular non-compaction, or arrhythmogenic right ventricular cardiomyopathy. History of arrhythmia notable for no members. Denies family history of sudden cardiac death including drowning, car accidents, or unexplained deaths in the family (her father had MI). No history of bicuspid aortic valve or aortic aneurysm or dissection.  ROS:   Please see the history of present illness.     All other systems reviewed and are negative.  EKGs/Labs/Other Studies Reviewed:    The following studies were reviewed today: EKG: 12/19/20: SR rate 66 WNL- no changes 11/05/19: SR 64 WNL  Recent Labs: No results found for requested labs within last 8760 hours.  Recent Lipid Panel    Component Value Date/Time   CHOL 155 11/05/2019 0818   TRIG 71 11/05/2019 0818   HDL 84 11/05/2019 0818   LDLCALC 57 11/05/2019 0818   Hgb 12.8 HCT 37.4 Plt 259 AST/ALT 24/27 Creatinine 0.69 TSH 1.85  Risk Assessment/Calculations:     The 10-year ASCVD risk score (Arnett DK, et al., 2019) is: 2.4%   Values used to calculate the score:     Age: 75 years     Sex: Female     Is Non-Hispanic African American: Yes     Diabetic: No     Tobacco smoker: No     Systolic Blood Pressure: 126 mmHg     Is BP treated: No     HDL Cholesterol: 84 mg/dL     Total Cholesterol: 155  mg/dL  Physical Exam:    VS:  BP 126/88    Pulse 66    Ht 5\' 4"  (1.626 m)    Wt 191 lb 12.8 oz (87 kg)    SpO2 98%    BMI 32.92 kg/m     Wt Readings from Last 3 Encounters:  12/19/20 191 lb 12.8 oz (87 kg)  01/10/20 187 lb (84.8 kg)  12/19/19 192 lb (87.1 kg)    Gen: no distress  Neck: No JVD Ears:  12/21/19 Sign Cardiac: No Rubs or Gallops, no Murmur, normal rhythm and +2 radial pulses Respiratory: Clear to auscultation bilaterally, normal effort, normal  respiratory rate GI: Soft, nontender, non-distended  MS: No  edema;  moves all extremities Integument: Skin feels warm Neuro:  At time of evaluation, alert and oriented to person/place/time/situation  Psych: Normal affect, patient feels well   ASSESSMENT:    1. Family history of coronary artery disease     PLAN:     Cardiac Preventive Visit Family history of coronary artery disease Elevated HS-CRP (10) Elevated MPO AHA Life's Simple Seven- People with at least five ideal Lifes Simple 7 metrics had a 78% reduced risk for heart-related death compared to people with no ideal metrics. - Discussed Decreasing bread and dietary sugars - Discussed recommendations for 150 minus weekly exercise - Discussed the important of blood sugar and blood pressure control - Reviewed AMP BP; under 130/80.   - though BMI is not a perfect measurement in all patient populations; a BMI < 30 can improve cardiac outcomes; present BMI is  still 32; we discussed WC as a marker of metabolic syndrome - discussed limitations of MPO and HS-CRP to guide prevention medications at this ttime - 0 CAC score checked 2022 - discussed mindfulness medication.  One year follow up with me      Medication Adjustments/Labs and Tests Ordered: Current medicines are reviewed at length with the patient today.  Concerns regarding medicines are outlined above.  Orders Placed This Encounter  Procedures   EKG 12-Lead    No orders of the defined types were placed  in this encounter.   Patient Instructions  Medication Instructions:  Your physician recommends that you continue on your current medications as directed. Please refer to the Current Medication list given to you today.  *If you need a refill on your cardiac medications before your next appointment, please call your pharmacy*   Lab Work: NONE If you have labs (blood work) drawn today and your tests are completely normal, you will receive your results only by: MyChart Message (if you have MyChart) OR A paper copy in the mail If you have any lab test that is abnormal or we need to change your treatment, we will call you to review the results.   Testing/Procedures: NONE   Follow-Up: At [facility], you and your health needs are our priority.  As part of our continuing mission to provide you with exceptional heart care, we have created designated Provider Care Teams.  These Care Teams include your primary Cardiologist (physician) and Advanced Practice Providers (APPs -  Physician Assistants and Nurse Practitioners) who all work together to provide you with the care you need, when you need it.  We recommend signing up for the patient portal called "MyChart".  Sign up information is provided on this After Visit Summary.  MyChart is used to connect with patients for Virtual Visits (Telemedicine).  Patients are able to view lab/test results, encounter notes, upcoming appointments, etc.  Non-urgent messages can be sent to your provider as well.   To learn more about what you can do with MyChart, go to [email or web address].    Your next appointment:   12 month(s)  The format for your next appointment:   In Person  Provider:   Christell Constant, MD        Signed, Christell Constant, MD  12/19/2020 9:00 AM    [facility]

## 2020-12-19 ENCOUNTER — Ambulatory Visit (INDEPENDENT_AMBULATORY_CARE_PROVIDER_SITE_OTHER): Payer: 59 | Admitting: Internal Medicine

## 2020-12-19 ENCOUNTER — Encounter: Payer: Self-pay | Admitting: Internal Medicine

## 2020-12-19 ENCOUNTER — Other Ambulatory Visit: Payer: Self-pay

## 2020-12-19 VITALS — BP 126/88 | HR 66 | Ht 64.0 in | Wt 191.8 lb

## 2020-12-19 DIAGNOSIS — Z8249 Family history of ischemic heart disease and other diseases of the circulatory system: Secondary | ICD-10-CM | POA: Diagnosis not present

## 2020-12-19 NOTE — Patient Instructions (Signed)
Medication Instructions:  Your physician recommends that you continue on your current medications as directed. Please refer to the Current Medication list given to you today.  *If you need a refill on your cardiac medications before your next appointment, please call your pharmacy*   Lab Work: NONE If you have labs (blood work) drawn today and your tests are completely normal, you will receive your results only by: MyChart Message (if you have MyChart) OR A paper copy in the mail If you have any lab test that is abnormal or we need to change your treatment, we will call you to review the results.   Testing/Procedures: NONE   Follow-Up: At [facility], you and your health needs are our priority.  As part of our continuing mission to provide you with exceptional heart care, we have created designated Provider Care Teams.  These Care Teams include your primary Cardiologist (physician) and Advanced Practice Providers (APPs -  Physician Assistants and Nurse Practitioners) who all work together to provide you with the care you need, when you need it.  We recommend signing up for the patient portal called "MyChart".  Sign up information is provided on this After Visit Summary.  MyChart is used to connect with patients for Virtual Visits (Telemedicine).  Patients are able to view lab/test results, encounter notes, upcoming appointments, etc.  Non-urgent messages can be sent to your provider as well.   To learn more about what you can do with MyChart, go to [email or web address].    Your next appointment:   12 month(s)  The format for your next appointment:   In Person  Provider:   Mahesh A Chandrasekhar, MD      

## 2021-01-07 ENCOUNTER — Other Ambulatory Visit: Payer: Self-pay | Admitting: Obstetrics and Gynecology

## 2021-01-07 DIAGNOSIS — Z1231 Encounter for screening mammogram for malignant neoplasm of breast: Secondary | ICD-10-CM

## 2021-02-09 ENCOUNTER — Ambulatory Visit
Admission: RE | Admit: 2021-02-09 | Discharge: 2021-02-09 | Disposition: A | Discharge status: Home / Self Care | Payer: 59 | Source: Ambulatory Visit | Attending: Obstetrics and Gynecology | Admitting: Obstetrics and Gynecology

## 2021-02-09 DIAGNOSIS — Z1231 Encounter for screening mammogram for malignant neoplasm of breast: Secondary | ICD-10-CM

## 2021-03-10 ENCOUNTER — Other Ambulatory Visit: Payer: Self-pay | Admitting: Physical Medicine and Rehabilitation

## 2021-03-10 DIAGNOSIS — M5412 Radiculopathy, cervical region: Secondary | ICD-10-CM

## 2021-03-10 DIAGNOSIS — M5416 Radiculopathy, lumbar region: Secondary | ICD-10-CM

## 2021-03-16 ENCOUNTER — Other Ambulatory Visit: Payer: Self-pay

## 2021-03-16 ENCOUNTER — Ambulatory Visit (INDEPENDENT_AMBULATORY_CARE_PROVIDER_SITE_OTHER): Payer: 59 | Admitting: Podiatry

## 2021-03-16 DIAGNOSIS — M722 Plantar fascial fibromatosis: Secondary | ICD-10-CM

## 2021-03-16 MED ORDER — TRIAMCINOLONE ACETONIDE 10 MG/ML IJ SUSP
10.0000 mg | Freq: Once | INTRAMUSCULAR | Status: AC
  Administered 2021-03-16: 10 mg

## 2021-03-16 NOTE — Patient Instructions (Signed)
While at your visit today you received a steroid injection in your foot or ankle to help with your pain. Along with having the steroid medication there is some "numbing" medication in the shot that you received. Due to this you may notice some numbness to the area for the next couple of hours.  ° °I would recommend limiting activity for the next few days to help the steroid injection take affect.  °  °The actually benefit from the steroid injection may take up to 2-7 days to see a difference. You may actually experience a small (as in 10%) INCREASE in pain in the first 24 hours---that is common. It would be best if you can ice the area today and take anti-inflammatory medications (such as Ibuprofen, Motrin, or Aleve) if you are able to take these medications. If you were prescribed another medication to help with the pain go ahead and start that medication today  °  °Things to watch out for that you should contact us or a health care provider urgently would include: °1. Unusual (as in more than 10%) increase in pain °2. New fever > 101.5 °3. New swelling or redness of the injected area.  °4. Streaking of red lines around the area injected. ° °If you have any questions or concerns about this, please give our office a call at [phone].  ° ° °Plantar Fasciitis (Heel Spur Syndrome) °with Rehab °The plantar fascia is a fibrous, ligament-like, soft-tissue structure that spans the bottom of the foot. Plantar fasciitis is a condition that causes pain in the foot due to inflammation of the tissue. °SYMPTOMS  °Pain and tenderness on the underneath side of the foot. °Pain that worsens with standing or walking. °CAUSES  °Plantar fasciitis is caused by irritation and injury to the plantar fascia on the underneath side of the foot. Common mechanisms of injury include: °Direct trauma to bottom of the foot. °Damage to a small nerve that runs under the foot where the main fascia attaches to the heel bone. °Stress placed on the  plantar fascia due to bone spurs. °RISK INCREASES WITH:  °Activities that place stress on the plantar fascia (running, jumping, pivoting, or cutting). °Poor strength and flexibility. °Improperly fitted shoes. °Tight calf muscles. °Flat feet. °Failure to warm-up properly before activity. °Obesity. °PREVENTION °Warm up and stretch properly before activity. °Allow for adequate recovery between workouts. °Maintain physical fitness: °Strength, flexibility, and endurance. °Cardiovascular fitness. °Maintain a health body weight. °Avoid stress on the plantar fascia. °Wear properly fitted shoes, including arch supports for individuals who have flat feet. ° °PROGNOSIS  °If treated properly, then the symptoms of plantar fasciitis usually resolve without surgery. However, occasionally surgery is necessary. ° °RELATED COMPLICATIONS  °Recurrent symptoms that may result in a chronic condition. °Problems of the lower back that are caused by compensating for the injury, such as limping. °Pain or weakness of the foot during push-off following surgery. °Chronic inflammation, scarring, and partial or complete fascia tear, occurring more often from repeated injections. ° °TREATMENT  °Treatment initially involves the use of ice and medication to help reduce pain and inflammation. The use of strengthening and stretching exercises may help reduce pain with activity, especially stretches of the Achilles tendon. These exercises may be performed at home or with a therapist. Your caregiver may recommend that you use heel cups of arch supports to help reduce stress on the plantar fascia. Occasionally, corticosteroid injections are given to reduce inflammation. If symptoms persist for greater than 6 months despite   non-surgical (conservative), then surgery may be recommended.  ° °MEDICATION  °If pain medication is necessary, then nonsteroidal anti-inflammatory medications, such as aspirin and ibuprofen, or other minor pain relievers, such as  acetaminophen, are often recommended. °Do not take pain medication within 7 days before surgery. °Prescription pain relievers may be given if deemed necessary by your caregiver. Use only as directed and only as much as you need. °Corticosteroid injections may be given by your caregiver. These injections should be reserved for the most serious cases, because they may only be given a certain number of times. ° °HEAT AND COLD °Cold treatment (icing) relieves pain and reduces inflammation. Cold treatment should be applied for 10 to 15 minutes every 2 to 3 hours for inflammation and pain and immediately after any activity that aggravates your symptoms. Use ice packs or massage the area with a piece of ice (ice massage). °Heat treatment may be used prior to performing the stretching and strengthening activities prescribed by your caregiver, physical therapist, or athletic trainer. Use a heat pack or soak the injury in warm water. ° °SEEK IMMEDIATE MEDICAL CARE IF: °Treatment seems to offer no benefit, or the condition worsens. °Any medications produce adverse side effects. ° °EXERCISES- °RANGE OF MOTION (ROM) AND STRETCHING EXERCISES - Plantar Fasciitis (Heel Spur Syndrome) °These exercises may help you when beginning to rehabilitate your injury. Your symptoms may resolve with or without further involvement from your physician, physical therapist or athletic trainer. While completing these exercises, remember:  °Restoring tissue flexibility helps normal motion to return to the joints. This allows healthier, less painful movement and activity. °An effective stretch should be held for at least 30 seconds. °A stretch should never be painful. You should only feel a gentle lengthening or release in the stretched tissue. ° °RANGE OF MOTION - Toe Extension, Flexion °Sit with your right / left leg crossed over your opposite knee. °Grasp your toes and gently pull them back toward the top of your foot. You should feel a stretch on  the bottom of your toes and/or foot. °Hold this stretch for 10 seconds. °Now, gently pull your toes toward the bottom of your foot. You should feel a stretch on the top of your toes and or foot. °Hold this stretch for 10 seconds. °Repeat  times. Complete this stretch 3 times per day.  ° °RANGE OF MOTION - Ankle Dorsiflexion, Active Assisted °Remove shoes and sit on a chair that is preferably not on a carpeted surface. °Place right / left foot under knee. Extend your opposite leg for support. °Keeping your heel down, slide your right / left foot back toward the chair until you feel a stretch at your ankle or calf. If you do not feel a stretch, slide your bottom forward to the edge of the chair, while still keeping your heel down. °Hold this stretch for 10 seconds. °Repeat 3 times. Complete this stretch 2 times per day.  ° °STRETCH  Gastroc, Standing °Place hands on wall. °Extend right / left leg, keeping the front knee somewhat bent. °Slightly point your toes inward on your back foot. °Keeping your right / left heel on the floor and your knee straight, shift your weight toward the wall, not allowing your back to arch. °You should feel a gentle stretch in the right / left calf. Hold this position for 10 seconds. °Repeat 3 times. Complete this stretch 2 times per day. ° °STRETCH  Soleus, Standing °Place hands on wall. °Extend right / left leg, keeping   the other knee somewhat bent. °Slightly point your toes inward on your back foot. °Keep your right / left heel on the floor, bend your back knee, and slightly shift your weight over the back leg so that you feel a gentle stretch deep in your back calf. °Hold this position for 10 seconds. °Repeat 3 times. Complete this stretch 2 times per day. ° °STRETCH  Gastrocsoleus, Standing  °Note: This exercise can place a lot of stress on your foot and ankle. Please complete this exercise only if specifically instructed by your caregiver.  °Place the ball of your right / left foot  on a step, keeping your other foot firmly on the same step. °Hold on to the wall or a rail for balance. °Slowly lift your other foot, allowing your body weight to press your heel down over the edge of the step. °You should feel a stretch in your right / left calf. °Hold this position for 10 seconds. °Repeat this exercise with a slight bend in your right / left knee. °Repeat 3 times. Complete this stretch 2 times per day.  ° °STRENGTHENING EXERCISES - Plantar Fasciitis (Heel Spur Syndrome)  °These exercises may help you when beginning to rehabilitate your injury. They may resolve your symptoms with or without further involvement from your physician, physical therapist or athletic trainer. While completing these exercises, remember:  °Muscles can gain both the endurance and the strength needed for everyday activities through controlled exercises. °Complete these exercises as instructed by your physician, physical therapist or athletic trainer. Progress the resistance and repetitions only as guided. ° °STRENGTH - Towel Curls °Sit in a chair positioned on a non-carpeted surface. °Place your foot on a towel, keeping your heel on the floor. °Pull the towel toward your heel by only curling your toes. Keep your heel on the floor. °Repeat 3 times. Complete this exercise 2 times per day. ° °STRENGTH - Ankle Inversion °Secure one end of a rubber exercise band/tubing to a fixed object (table, pole). Loop the other end around your foot just before your toes. °Place your fists between your knees. This will focus your strengthening at your ankle. °Slowly, pull your big toe up and in, making sure the band/tubing is positioned to resist the entire motion. °Hold this position for 10 seconds. °Have your muscles resist the band/tubing as it slowly pulls your foot back to the starting position. °Repeat 3 times. Complete this exercises 2 times per day.  °Document Released: 12/21/2004 Document Revised: 03/15/2011 Document Reviewed:  04/04/2008 °[employer]® Patient Information ©2014 [employer]. ° °

## 2021-03-17 NOTE — Progress Notes (Signed)
Subjective: ?61 year old female presents the office today for for follow evaluation of heel pain, plantar fasciitis.  She states that she has been doing well however over the last month or so the pain is started to come back.  No recent injury or changes.  Numbness or tingling.  No radiating pain.  She purposely gets pain if she is sitting and stands back up.  Is becoming more consistent.  Left is equal to the right.  She like to proceed with steroid injections today. ? ?Objective: ?AAO x3, NAD ?DP/PT pulses palpable bilaterally, CRT less than 3 seconds ?There is tenderness to palpation on plantar medial tubercle of the Insertion of plantar fascia bilaterally.  There is no pain with lateral compression of calcaneus.  There is no pain with Achilles tendon.  No other areas of pinpoint tenderness.  Bunion present left foot which occasionally causes discomfort.  No second pain today.  MMT 5/5.  Negative Tinel sign.  ?No pain with calf compression, erythema or warmth ? ?Assessment: ?Right foot bunion, bilateral plantar fasciitis ? ?Plan: ?-All treatment options discussed with the patient including all alternatives, risks, complications.  ?-Steroid injections performed bilaterally.  See procedure note below.   ?-We can discuss stretching, icing today.  Discussed modifications and good arch support.  Discussed being consistent with this to help get the symptoms to go away.  As well as to help with reoccurrence.   ? ?Procedure: Injection Tendon/Ligament ?Discussed alternatives, risks, complications and verbal consent was obtained.  ?Location: Bilateral plantar fascia at the glabrous junction; medial approach. ?Skin Prep: Alcohol. ?Injectate: 0.5cc 0.5% marcaine plain, 0.5 cc 2% lidocaine plain and, 1 cc kenalog 10. ?Disposition: Patient tolerated procedure well. Injection site dressed with a band-aid.  ?Post-injection care was discussed and return precautions discussed.  ? ?Return in about 6 weeks (around 04/27/2021), or if  symptoms worsen or fail to improve. ? ?[name] DPM ? ?

## 2021-03-27 ENCOUNTER — Telehealth: Payer: Self-pay

## 2021-03-30 ENCOUNTER — Telehealth: Payer: Self-pay

## 2021-03-30 MED ORDER — DIPHENHYDRAMINE HCL 50 MG PO TABS: 50.0000 mg | ORAL_TABLET | Freq: Once | ORAL | 0 refills | Status: DC

## 2021-03-30 MED ORDER — PREDNISONE 50 MG PO TABS: ORAL_TABLET | ORAL | 0 refills | Status: DC

## 2021-03-30 NOTE — Telephone Encounter (Signed)
Lmom for pt to call nursing staff back at [facility] ?

## 2021-03-30 NOTE — Telephone Encounter (Signed)
Phone call to patient to review instructions for 13 hr prep for Inection w/ contrast on 04/10/21 at 8:00 AM. Prescription called into [facility]. Pt aware and verbalized understanding of instructions. ?Prescription: ?Pt to take 50 mg of prednisone on 04/09/21 at 7:00 PM, 50 mg of prednisone on 04/10/21 at 1:00 AM, and 50 mg of prednisone on 04/10/21 at 7:00 AM. Pt is also to take 50 mg of benadryl on 04/10/21 at 7:00 AM. Please call [phone] with any questions.   ? ?Benadryl also sent in as a prescription, per the pts request. Pt advised not to drive the day of taking this medication as it may cause drowsiness. Pt verbalized understanding.   ?

## 2021-04-10 ENCOUNTER — Ambulatory Visit
Admission: RE | Admit: 2021-04-10 | Discharge: 2021-04-10 | Disposition: A | Discharge status: Home / Self Care | Payer: 59 | Source: Ambulatory Visit | Attending: Physical Medicine and Rehabilitation | Admitting: Physical Medicine and Rehabilitation

## 2021-04-10 DIAGNOSIS — M5416 Radiculopathy, lumbar region: Secondary | ICD-10-CM

## 2021-04-10 MED ORDER — METHYLPREDNISOLONE ACETATE 40 MG/ML INJ SUSP (RADIOLOG
80.0000 mg | Freq: Once | INTRAMUSCULAR | Status: AC
  Administered 2021-04-10: 80 mg via EPIDURAL

## 2021-04-10 MED ORDER — IOPAMIDOL (ISOVUE-M 200) INJECTION 41%
1.0000 mL | Freq: Once | INTRAMUSCULAR | Status: AC
  Administered 2021-04-10: 1 mL via EPIDURAL

## 2021-04-10 NOTE — Discharge Instructions (Signed)

## 2021-04-27 ENCOUNTER — Ambulatory Visit: Payer: 59 | Admitting: Podiatry
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
## 2021-06-23 ENCOUNTER — Encounter (HOSPITAL_BASED_OUTPATIENT_CLINIC_OR_DEPARTMENT_OTHER): Payer: Self-pay | Admitting: Physical Therapy

## 2021-06-23 ENCOUNTER — Ambulatory Visit (HOSPITAL_BASED_OUTPATIENT_CLINIC_OR_DEPARTMENT_OTHER): Payer: 59 | Attending: Orthopedic Surgery | Admitting: Physical Therapy

## 2021-06-23 DIAGNOSIS — M5459 Other low back pain: Secondary | ICD-10-CM | POA: Insufficient documentation

## 2021-06-23 DIAGNOSIS — R262 Difficulty in walking, not elsewhere classified: Secondary | ICD-10-CM | POA: Insufficient documentation

## 2021-06-23 DIAGNOSIS — M6281 Muscle weakness (generalized): Secondary | ICD-10-CM | POA: Insufficient documentation

## 2021-06-25 ENCOUNTER — Ambulatory Visit (HOSPITAL_BASED_OUTPATIENT_CLINIC_OR_DEPARTMENT_OTHER): Payer: 59 | Admitting: Physical Therapy

## 2021-06-25 ENCOUNTER — Encounter (HOSPITAL_BASED_OUTPATIENT_CLINIC_OR_DEPARTMENT_OTHER): Payer: Self-pay | Admitting: Physical Therapy

## 2021-06-25 DIAGNOSIS — R262 Difficulty in walking, not elsewhere classified: Secondary | ICD-10-CM

## 2021-06-25 DIAGNOSIS — M6281 Muscle weakness (generalized): Secondary | ICD-10-CM

## 2021-06-25 DIAGNOSIS — M5459 Other low back pain: Secondary | ICD-10-CM

## 2021-06-25 NOTE — Therapy (Signed)
OUTPATIENT PHYSICAL THERAPY THORACOLUMBAR TREATMENT   Patient Name: Melissa Roth MRN: [id number] DOB:06-10-1960, 61 y.o., female Today's Date: 06/25/2021   PT End of Session - 06/25/21 0848     Visit Number 2    Number of Visits 19    Date for PT Re-Evaluation 09/21/21    Authorization Type [employer]et[employer]    PT Start Time [phone]    PT Stop Time 0928    PT Time Calculation (min) 41 min    Activity Tolerance Patient tolerated treatment well    Behavior During Therapy WFL for tasks assessed/performed             Past Medical History:  Diagnosis Date   Back pain    Cervical disc disorder with radiculopathy of cervical region    PONV (postoperative nausea and vomiting)    Sciatica    Vitamin D deficiency    Past Surgical History:  Procedure Laterality Date   ABDOMINAL HYSTERECTOMY     CHOLECYSTECTOMY N/A 06/07/2017   Procedure: LAPAROSCOPIC CHOLECYSTECTOMY;  Surgeon: Jimmye Norman, MD;  Location: [facility];  Service: General;  Laterality: N/A;   FOOT SURGERY     LUMBAR LAMINECTOMY/DECOMPRESSION MICRODISCECTOMY Left 11/10/2016   Procedure: Left L5-S1 discectomy;  Surgeon: Venita Lick, MD;  Location: [facility];  Service: Orthopedics;  Laterality: Left;  120 mins   Patient Active Problem List   Diagnosis Date Noted   Family history of coronary artery disease 12/19/2019   Prediabetes 11/06/2019   Class 1 obesity due to excess calories with body mass index (BMI) of 32.0 to 32.9 in adult 11/05/2019   Bunion 01/19/2019   Pain in right knee 01/02/2019   Cervical radiculopathy 02/07/2018   Chronic pain syndrome 10/03/2017   Closed fracture of second toe of left foot 08/23/2017   DDD (degenerative disc disease), cervical 03/28/2017   Lumbar post-laminectomy syndrome 03/28/2017   Status post lumbar spine surgery for decompression of spinal cord 11/10/2016   Plantar fasciitis 02/03/2016   Left shoulder pain 12/26/2012   Low back pain 09/25/2012   Cervical pain (neck) 09/25/2012    PCP:  Jarrett Soho, PA-C  REFERRING PROVIDER: Venita Lick, MD  REFERRING DIAG: M54.50 (ICD-10-CM) - Low back pain, unspecified  Rationale for Evaluation and Treatment Rehabilitation  THERAPY DIAG:  Other low back pain  Difficulty walking  Muscle weakness (generalized)  ONSET DATE: 05/2021  SUBJECTIVE:                                                                                                                                                                                           SUBJECTIVE STATEMENT:  Pt reports  she has completed her HEP exercises. No other changes since eval.   PERTINENT HISTORY:   2018 Lumbar laminectomy/decompression microdiscectomy (Left) Pre-diabetic C/S radic   PAIN:  Are you having pain? Yes: NPRS scale: 8/10 Pain location: bilat Pain description: dull "toothache", cramping Aggravating factors: sitting (1 hr at most), standing  Relieving factors: meds, walking   PRECAUTIONS: None  WEIGHT BEARING RESTRICTIONS No  FALLS:  Has patient fallen in last 6 months? No  LIVING ENVIRONMENT: Lives with: lives with their family Lives in: House/apartment Stairs: No Has following equipment at home: None  OCCUPATION: WFH; at a desk   PLOF: Independent  PATIENT GOALS :   OBJECTIVE: * Findings taken at [facility] unless otherwise noted.   DIAGNOSTIC FINDINGS:  N/A  PATIENT SURVEYS:  FOTO 53 63 @ DC 3 pts  SCREENING FOR RED FLAGS: Bowel or bladder incontinence: No Spinal tumors: No Cauda equina syndrome: No Compression fracture: No   COGNITION:  Overall cognitive status: Within functional limits for tasks assessed     SENSATION: Light touch: Impaired   POSTURE: decreased lumbar lordosis  PALPATION: TTP and hypertonicity of bilat L3-5 lumbar paraspinals; bilat QL; bilat SIJ, glutes, and hip rotators  LUMBAR ROM:   Active  A/PROM  eval  Flexion 50% p!  Extension 25% p!  Right lateral flexion 75% p!  Left lateral flexion  75% p!  Right rotation 75% p!  Left rotation 75% p!   (Blank rows = not tested)  LOWER EXTREMITY ROM:   too sensitive,painful to test following AROM and lumbar testing, 7/10 pain  LOWER EXTREMITY MMT:    MMT Right eval Left eval  Hip flexion 4/5 4/5  Hip extension    Hip abduction 4/5 4/5  Hip adduction Too irritable to test Too irritable to test   (Blank rows = not tested)  LUMBAR SPECIAL TESTS:  Straight leg raise test: Positive, Slump test: Positive, SI Compression/distraction test: Positive, FABER test: Positive, and Trendelenburg sign: Positive  FUNCTIONAL TESTS:  5 times sit to stand: 13.2s   (significant increase in pain)  GAIT: Distance walked: 57ft  Assistive device utilized: None Level of assistance: Complete Independence Comments: Bilat Trendelenburg, decrease lumbar lordosis   TODAY'S TREATMENT  Seated on bench on deck- Side stretch 10 sec x 2 reps each, with opp hand on bench for support  Pt seen for aquatic therapy today.  Treatment took place in water 3.25-4 ft in depth at the [facility] pool. Temp of water was 91.  Pt entered/exited the pool via stairs independently with bilat rail. Intro to aquatic setting.  In 19ft+ depth:   forward / backward walking without support;   forward high knee marching  Side stepping holding yellow noodle (cues to increase speed - very slow) In 16ft 6"-4 ft:  Holding wall:  squats; heel/toe raises; hip abdct/add;  hip flex / ext leg swings  At bench with feet on blue step:   STS x 10 with core engaged Blue short noodle pull down to thighs x 10  Pt requires buoyancy for support and to offload joints with strengthening exercises. Viscosity of the water is needed for resistance of strengthening; water current perturbations provides challenge to standing balance unsupported, requiring increased core activation.    PATIENT EDUCATION:  Education details: aquatic intro, DOMS expectations  Person educated:  Patient Education method: explanation, demonstration Education comprehension: verbalized understanding, returned demonstration, verbal cues required, and tactile cues required   HOME EXERCISE PROGRAM:  Access Code: [email or web address] URL: [email or web address]  Date: 06/23/2021 Prepared by: Zebedee Iba  ASSESSMENT:  CLINICAL IMPRESSION: Pt reported elimination of back pain while in the water.  She required minor cues for posture and for increased motion in LLE (guarded at times).  She is independent in the aquatic setting and can take cues/ direction from therapist on deck. Pt would benefit from continued skilled therapy in order to reach goals and maximize functional lumbopelvic strength and ROM for return to PLOF.   Pt potentially to add C/S pain to this episode of care. Pt to contact MD in order to send updated referral.   OBJECTIVE IMPAIRMENTS Abnormal gait, decreased activity tolerance, decreased balance, decreased mobility, difficulty walking, decreased ROM, decreased strength, hypomobility, increased muscle spasms, impaired flexibility, impaired sensation, improper body mechanics, postural dysfunction, and pain.   ACTIVITY LIMITATIONS carrying, lifting, bending, sitting, standing, squatting, sleeping, stairs, transfers, and locomotion level  PARTICIPATION LIMITATIONS: cleaning, laundry, interpersonal relationship, driving, shopping, community activity, yard work, and exercise  PERSONAL FACTORS Age, Fitness, Time since onset of injury/illness/exacerbation, and 1-2 comorbidities:    are also affecting patient's functional outcome.   REHAB POTENTIAL: Fair    CLINICAL DECISION MAKING: Evolving/moderate complexity  EVALUATION COMPLEXITY: Moderate   GOALS:  SHORT TERM GOALS: Target date: 08/04/2021  Pt will become independent with HEP in order to demonstrate synthesis of PT education.   Goal status: INITIAL  2.  Pt will report at least 2 pt reduction on NPRS scale for pain  in order to demonstrate functional improvement with household activity, self care, and ADL.   Goal status: INITIAL  3.   Pt will score at least 3 pt increase on FOTO to demonstrate functional improvement in MCII and pt perceived function.    Goal status: INITIAL    LONG TERM GOALS: Target date: 09/15/2021  Pt  will become independent with final HEP in order to demonstrate synthesis of PT education.  Goal status: INITIAL  2.  Pt will score >/= 63 on FOTO to demonstrate improvement in perceived lumbar function.  Goal status: INITIAL  3.  Pt will be able to perform 5XSTS in under 12s  in order to demonstrate functional improvement above the cut off score for adults.   Goal status: INITIAL  4.  Pt will be able to demonstrate/report ability to sit/stand/sleep for extended periods of time without pain in order to demonstrate functional improvement and tolerance to static positioning.    Goal status: INITIAL    PLAN: PT FREQUENCY: 1-2x/week  PT DURATION: 12 weeks (likely DC in 8 wks)  PLANNED INTERVENTIONS: Therapeutic exercises, Therapeutic activity, Neuromuscular re-education, Balance training, Gait training, Patient/Family education, Joint manipulation, Joint mobilization, Stair training, Orthotic/Fit training, DME instructions, Aquatic Therapy, Dry Needling, Electrical stimulation, Spinal manipulation, Spinal mobilization, Cryotherapy, Moist heat, scar mobilization, Splintting, Taping, Vasopneumatic device, Traction, Ultrasound, Ionotophoresis 4mg /ml Dexamethasone, Manual therapy, and Re-evaluation.  PLAN FOR NEXT SESSION: continue aquatics, lumbar ROM/flexbility,  lumbopelvic strength, hip stability  , PTA 06/25/21 9:29 AM

## 2021-06-30 ENCOUNTER — Ambulatory Visit (HOSPITAL_BASED_OUTPATIENT_CLINIC_OR_DEPARTMENT_OTHER): Payer: 59 | Admitting: Physical Therapy

## 2021-06-30 ENCOUNTER — Encounter (HOSPITAL_BASED_OUTPATIENT_CLINIC_OR_DEPARTMENT_OTHER): Payer: Self-pay | Admitting: Physical Therapy

## 2021-06-30 DIAGNOSIS — M5459 Other low back pain: Secondary | ICD-10-CM

## 2021-06-30 DIAGNOSIS — M6281 Muscle weakness (generalized): Secondary | ICD-10-CM

## 2021-06-30 DIAGNOSIS — R262 Difficulty in walking, not elsewhere classified: Secondary | ICD-10-CM

## 2021-07-02 ENCOUNTER — Encounter (HOSPITAL_BASED_OUTPATIENT_CLINIC_OR_DEPARTMENT_OTHER): Payer: Self-pay | Admitting: Physical Therapy

## 2021-07-02 ENCOUNTER — Ambulatory Visit (HOSPITAL_BASED_OUTPATIENT_CLINIC_OR_DEPARTMENT_OTHER): Payer: 59 | Admitting: Physical Therapy

## 2021-07-02 DIAGNOSIS — M5459 Other low back pain: Secondary | ICD-10-CM

## 2021-07-02 DIAGNOSIS — M6281 Muscle weakness (generalized): Secondary | ICD-10-CM

## 2021-07-02 DIAGNOSIS — R262 Difficulty in walking, not elsewhere classified: Secondary | ICD-10-CM

## 2021-07-02 NOTE — Therapy (Signed)
OUTPATIENT PHYSICAL THERAPY THORACOLUMBAR TREATMENT   Patient Name: Melissa Roth MRN: [id number] DOB:11-04-1960, 61 y.o., female Today's Date: 07/02/2021   PT End of Session - 07/02/21 0859     Visit Number 4    Number of Visits 19    Date for PT Re-Evaluation 09/21/21    Authorization Type [employer]    PT Start Time [phone]    PT Stop Time 0928    PT Time Calculation (min) 39 min    Activity Tolerance Patient tolerated treatment well    Behavior During Therapy WFL for tasks assessed/performed             Past Medical History:  Diagnosis Date   Back pain    Cervical disc disorder with radiculopathy of cervical region    PONV (postoperative nausea and vomiting)    Sciatica    Vitamin D deficiency    Past Surgical History:  Procedure Laterality Date   ABDOMINAL HYSTERECTOMY     CHOLECYSTECTOMY N/A 06/07/2017   Procedure: LAPAROSCOPIC CHOLECYSTECTOMY;  Surgeon: Jimmye Norman, MD;  Location: [facility] OR;  Service: General;  Laterality: N/A;   FOOT SURGERY     LUMBAR LAMINECTOMY/DECOMPRESSION MICRODISCECTOMY Left 11/10/2016   Procedure: Left L5-S1 discectomy;  Surgeon: Venita Lick, MD;  Location: [facility] OR;  Service: Orthopedics;  Laterality: Left;  120 mins   Patient Active Problem List   Diagnosis Date Noted   Family history of coronary artery disease 12/19/2019   Prediabetes 11/06/2019   Class 1 obesity due to excess calories with body mass index (BMI) of 32.0 to 32.9 in adult 11/05/2019   Bunion 01/19/2019   Pain in right knee 01/02/2019   Cervical radiculopathy 02/07/2018   Chronic pain syndrome 10/03/2017   Closed fracture of second toe of left foot 08/23/2017   DDD (degenerative disc disease), cervical 03/28/2017   Lumbar post-laminectomy syndrome 03/28/2017   Status post lumbar spine surgery for decompression of spinal cord 11/10/2016   Plantar fasciitis 02/03/2016   Left shoulder pain 12/26/2012   Low back pain 09/25/2012   Cervical pain (neck) 09/25/2012    PCP:  Jarrett Soho, PA-C  REFERRING PROVIDER: Venita Lick, MD  REFERRING DIAG: M54.50 (ICD-10-CM) - Low back pain, unspecified  Rationale for Evaluation and Treatment Rehabilitation  THERAPY DIAG:  Other low back pain  Difficulty walking  Muscle weakness (generalized)  ONSET DATE: 05/2021  SUBJECTIVE:                                                                                                                                                                                           SUBJECTIVE STATEMENT: Pt reports she  gets so much relief from the aquatic treatments that she plans to join this facility. She continues to walk 2 miles each day.   She called to get referral for neck.  She will be traveling to [place] tomorrow.     PERTINENT HISTORY:   2018 Lumbar laminectomy/decompression microdiscectomy (Left) Pre-diabetic C/S radic   PAIN:  Are you having pain? Yes: NPRS scale: 7/10 Pain location: bilat lower lumbar back Pain description: dull "toothache", cramping Aggravating factors: sitting (1 hr at most), standing  Relieving factors: meds, walking   PRECAUTIONS: None  WEIGHT BEARING RESTRICTIONS No  FALLS:  Has patient fallen in last 6 months? No  LIVING ENVIRONMENT: Lives with: lives with their family Lives in: House/apartment Stairs: No Has following equipment at home: None  OCCUPATION: WFH; at a desk   PLOF: Independent  PATIENT GOALS :   OBJECTIVE: * Findings taken at [facility] unless otherwise noted.   DIAGNOSTIC FINDINGS:  N/A  PATIENT SURVEYS:  FOTO 53 63 @ DC 3 pts  SCREENING FOR RED FLAGS: Bowel or bladder incontinence: No Spinal tumors: No Cauda equina syndrome: No Compression fracture: No   COGNITION:  Overall cognitive status: Within functional limits for tasks assessed     SENSATION: Light touch: Impaired   POSTURE: decreased lumbar lordosis  PALPATION: TTP and hypertonicity of bilat L3-5 lumbar paraspinals; bilat  QL; bilat SIJ, glutes, and hip rotators  LUMBAR ROM:   Active  A/PROM  eval  Flexion 50% p!  Extension 25% p!  Right lateral flexion 75% p!  Left lateral flexion 75% p!  Right rotation 75% p!  Left rotation 75% p!   (Blank rows = not tested)  LOWER EXTREMITY ROM:   too sensitive,painful to test following AROM and lumbar testing, 7/10 pain  LOWER EXTREMITY MMT:    MMT Right eval Left eval  Hip flexion 4/5 4/5  Hip extension    Hip abduction 4/5 4/5  Hip adduction Too irritable to test Too irritable to test   (Blank rows = not tested)  LUMBAR SPECIAL TESTS:  Straight leg raise test: Positive, Slump test: Positive, SI Compression/distraction test: Positive, FABER test: Positive, and Trendelenburg sign: Positive  FUNCTIONAL TESTS:  5 times sit to stand: 13.2s   (significant increase in pain)  GAIT: Distance walked: 31ft  Assistive device utilized: None Level of assistance: Complete Independence Comments: Bilat Trendelenburg, decrease lumbar lordosis   TODAY'S TREATMENT  Pt seen for aquatic therapy today.  Treatment took place in water 3.25-4.8 ft in depth at the [facility] pool. Temp of water was 91.  Pt entered/exited the pool via stairs independently with bilat rail. Intro to aquatic setting.  In 84ft+ depth:   forward / backward walking with yellow noodle    Holding wall:  squats; heel/toe raises; hip abdct/add 2 x 10;  hip ext x 10 each leg; hip circle CW/CCW Forward/backward high knee marching  with yellow noodle Side stepping  with arms abdct / add  Straddling yellow noodle, holding corner of pool wall:  cycling, hip abdct /add, and cc ski  On 4th step:    STS x 10 with core engaged, cues for form Fig4 glute stretch (pull to opp shoulder), and leaning forward (On 3rd step) Blue short noodle pull down to thighs x 10 Lumbar flex stretch with hands on kickboard, repeated on angle.   Pt requires buoyancy for support and to offload joints with  strengthening exercises. Viscosity of the water is needed for resistance of strengthening; water  current perturbations provides challenge to standing balance unsupported, requiring increased core activation.    PATIENT EDUCATION:  Education details: aquatic intro, DOMS expectations  Person educated: Patient Education method: explanation, demonstration Education comprehension: verbalized understanding, returned demonstration, verbal cues required, and tactile cues required   HOME EXERCISE PROGRAM:  Access Code: [email or web address] URL: [email or web address] Date: 06/23/2021 Prepared by: Zebedee Iba  ASSESSMENT:  CLINICAL IMPRESSION: Pt continues to report elimination of back pain while in the water. She is guarded with sitting on yellow noodle as bicycle; requires hands on wall for balance.  She was able to complete increased volume of exercises today.  She required demo of exercises for improvement of form, and cues to slow speed of counting. Encouraged lumbar pillow for drive.   Pt would benefit from continued skilled therapy in order to reach goals and maximize functional lumbopelvic strength and ROM for return to PLOF.   Pt potentially to add C/S pain to this episode of care.   OBJECTIVE IMPAIRMENTS Abnormal gait, decreased activity tolerance, decreased balance, decreased mobility, difficulty walking, decreased ROM, decreased strength, hypomobility, increased muscle spasms, impaired flexibility, impaired sensation, improper body mechanics, postural dysfunction, and pain.   ACTIVITY LIMITATIONS carrying, lifting, bending, sitting, standing, squatting, sleeping, stairs, transfers, and locomotion level  PARTICIPATION LIMITATIONS: cleaning, laundry, interpersonal relationship, driving, shopping, community activity, yard work, and exercise  PERSONAL FACTORS Age, Fitness, Time since onset of injury/illness/exacerbation, and 1-2 comorbidities:    are also affecting patient's functional  outcome.   REHAB POTENTIAL: Fair    CLINICAL DECISION MAKING: Evolving/moderate complexity  EVALUATION COMPLEXITY: Moderate   GOALS:  SHORT TERM GOALS: Target date: 08/04/2021  Pt will become independent with HEP in order to demonstrate synthesis of PT education.   Goal status: INITIAL  2.  Pt will report at least 2 pt reduction on NPRS scale for pain in order to demonstrate functional improvement with household activity, self care, and ADL.   Goal status: INITIAL  3.   Pt will score at least 3 pt increase on FOTO to demonstrate functional improvement in MCII and pt perceived function.    Goal status: INITIAL    LONG TERM GOALS: Target date: 09/15/2021  Pt  will become independent with final HEP in order to demonstrate synthesis of PT education.  Goal status: INITIAL  2.  Pt will score >/= 63 on FOTO to demonstrate improvement in perceived lumbar function.  Goal status: INITIAL  3.  Pt will be able to perform 5XSTS in under 12s  in order to demonstrate functional improvement above the cut off score for adults.   Goal status: INITIAL  4.  Pt will be able to demonstrate/report ability to sit/stand/sleep for extended periods of time without pain in order to demonstrate functional improvement and tolerance to static positioning.    Goal status: INITIAL    PLAN: PT FREQUENCY: 1-2x/week  PT DURATION: 12 weeks (likely DC in 8 wks)  PLANNED INTERVENTIONS: Therapeutic exercises, Therapeutic activity, Neuromuscular re-education, Balance training, Gait training, Patient/Family education, Joint manipulation, Joint mobilization, Stair training, Orthotic/Fit training, DME instructions, Aquatic Therapy, Dry Needling, Electrical stimulation, Spinal manipulation, Spinal mobilization, Cryotherapy, Moist heat, scar mobilization, Splintting, Taping, Vasopneumatic device, Traction, Ultrasound, Ionotophoresis 4mg /ml Dexamethasone, Manual therapy, and Re-evaluation.  PLAN FOR NEXT SESSION:  continue aquatics, lumbar ROM/flexbility,  lumbopelvic strength, hip stability  , PTA 07/02/21 9:41 AM

## 2021-07-07 ENCOUNTER — Ambulatory Visit
Admission: EM | Admit: 2021-07-07 | Discharge: 2021-07-07 | Disposition: A | Discharge status: Home / Self Care | Payer: 59 | Attending: Nurse Practitioner | Admitting: Nurse Practitioner

## 2021-07-07 ENCOUNTER — Encounter: Payer: Self-pay | Admitting: Emergency Medicine

## 2021-07-07 DIAGNOSIS — N390 Urinary tract infection, site not specified: Secondary | ICD-10-CM | POA: Diagnosis present

## 2021-07-07 LAB — POCT URINALYSIS DIP (MANUAL ENTRY)
Glucose, UA: NEGATIVE mg/dL
Ketones, POC UA: NEGATIVE mg/dL
Nitrite, UA: NEGATIVE
Protein Ur, POC: 100 mg/dL — AB
Spec Grav, UA: >=1.03 — AB (ref 1.010–1.025)
Urobilinogen, UA: 0.2 E.U./dL
pH, UA: 6 (ref 5.0–8.0)

## 2021-07-07 MED ORDER — CEPHALEXIN 500 MG PO CAPS: 500.0000 mg | ORAL_CAPSULE | Freq: Two times a day (BID) | ORAL | 0 refills | Status: DC

## 2021-07-07 NOTE — ED Triage Notes (Signed)
Lower abd pressure and frequent urination since yesterday.  Has been taking AZO.

## 2021-07-07 NOTE — ED Provider Notes (Signed)
[facility]    CSN: [id number] Arrival date & time: 07/07/21  1821      History   Chief Complaint No chief complaint on file.  HPI Melissa Roth is a 61 y.o. female.   Presenting today with 1 day history of lower abdominal pressure, urinary frequency and dysuria.  Denies fever, chills, abdominal pain, nausea vomiting, vaginal symptoms.  States she has a history of urinary tract infections that always feel very similar.  Trying Azo, cranberry, fluids with minimal relief so far.    Past Medical History:  Diagnosis Date   Back pain    Cervical disc disorder with radiculopathy of cervical region    PONV (postoperative nausea and vomiting)    Sciatica    Vitamin D deficiency    Patient Active Problem List   Diagnosis Date Noted   Family history of coronary artery disease 12/19/2019   Prediabetes 11/06/2019   Class 1 obesity due to excess calories with body mass index (BMI) of 32.0 to 32.9 in adult 11/05/2019   Bunion 01/19/2019   Pain in right knee 01/02/2019   Cervical radiculopathy 02/07/2018   Chronic pain syndrome 10/03/2017   Closed fracture of second toe of left foot 08/23/2017   DDD (degenerative disc disease), cervical 03/28/2017   Lumbar post-laminectomy syndrome 03/28/2017   Status post lumbar spine surgery for decompression of spinal cord 11/10/2016   Plantar fasciitis 02/03/2016   Left shoulder pain 12/26/2012   Low back pain 09/25/2012   Cervical pain (neck) 09/25/2012    Past Surgical History:  Procedure Laterality Date   ABDOMINAL HYSTERECTOMY     CHOLECYSTECTOMY N/A 06/07/2017   Procedure: LAPAROSCOPIC CHOLECYSTECTOMY;  Surgeon: Jimmye Norman, MD;  Location: [facility] OR;  Service: General;  Laterality: N/A;   FOOT SURGERY     LUMBAR LAMINECTOMY/DECOMPRESSION MICRODISCECTOMY Left 11/10/2016   Procedure: Left L5-S1 discectomy;  Surgeon: Venita Lick, MD;  Location: [facility] OR;  Service: Orthopedics;  Laterality: Left;  120 mins    OB History      Gravida  1   Para  1   Term      Preterm      AB      Living         SAB      IAB      Ectopic      Multiple      Live Births             Home Medications    Prior to Admission medications   Medication Sig Start Date End Date Taking? Authorizing Provider  cephALEXin (KEFLEX) 500 MG capsule Take 1 capsule (500 mg total) by mouth 2 (two) times daily. 07/07/21  Yes Particia Nearing, PA-C  baclofen (LIORESAL) 10 MG tablet as needed. 05/04/18   [provider]  benzonatate (TESSALON) 100 MG capsule Take 100 mg by mouth as needed. 11/27/19   [provider]  buPROPion (WELLBUTRIN XL) 150 MG 24 hr tablet Take 150 mg by mouth as needed. 08/08/20   [provider]  diclofenac (VOLTAREN) 75 MG EC tablet as needed.    [provider]  diphenhydrAMINE (BENADRYL) 50 MG tablet Take 1 tablet (50 mg total) by mouth once for 1 dose. Pt to take 50 mg of benadryl on 12/05/20 at 7 AM. Please call [phone] with any questions. Patient not taking: Reported on 12/19/2020 12/01/20 12/01/20  Oley Balm, MD  diphenhydrAMINE (BENADRYL) 50 MG tablet Take 1 tablet (50 mg total)  by mouth once for 1 dose. Pt is to take 50 mg of benadryl on 04/10/21 at 7:00 AM. Please call [phone] with any questions. 03/30/21 03/30/21  Nelson Chimes, MD  estradiol (ESTRACE) 0.1 MG/GM vaginal cream as needed. 08/14/18   [provider]  fluconazole (DIFLUCAN) 150 MG tablet Take by mouth as needed. 07/11/20   [provider]  gabapentin (NEURONTIN) 300 MG capsule Take 300 mg by mouth as needed. 02/25/20   [provider]  HYDROcodone-acetaminophen (NORCO/VICODIN) 5-325 MG tablet as needed.    [provider]  ipratropium (ATROVENT) 0.03 % nasal spray as needed.    [provider]  meloxicam (MOBIC) 15 MG tablet Take 1 tablet (15 mg total) by mouth daily. Patient taking differently: Take 15 mg by mouth as needed. 07/22/20 07/22/21  Trula Slade, DPM  methocarbamol (ROBAXIN) 500 MG tablet as needed.    [provider]  Multiple Vitamins-Minerals (MULTIVITAMIN WITH MINERALS) tablet Take 1 tablet by mouth daily.    [provider]  nitrofurantoin, macrocrystal-monohydrate, (MACROBID) 100 MG capsule Take 100 mg by mouth 2 (two) times daily. Patient not taking: Reported on 12/19/2020 07/04/20   [provider]  predniSONE (DELTASONE) 50 MG tablet Take Prednisone 50 mg by mouth on 12/07/19 at 12:30a.m., 6:30a.m. and 12:30p.m.; take Benadryl 50 mg by mouth on 12/07/19 at 12:30p.m. Patient taking differently: as needed. Take Prednisone 50 mg by mouth on 12/07/19 at 12:30a.m., 6:30a.m. and 12:30p.m.; take Benadryl 50 mg by mouth on 12/07/19 at 12:30p.m. 11/26/19   Logan Bores, MD  predniSONE (DELTASONE) 50 MG tablet Pt to take 50 mg of prednisone on 03/31/20 at 0030, 50 mg of prednisone on 03/31/20 at 0630, and 50 mg of prednisone on 03/31/20 at 1230. Pt is also to take 50 mg of benadryl on 03/31/20 at 1230. Please call [phone] with any questions. Patient not taking: Reported on 12/19/2020 03/27/20   Nelson Chimes, MD  predniSONE (DELTASONE) 50 MG tablet Pt to take 50 mg of prednisone on 04/16/20 at 6:30 PM, 50 mg of prednisone on 04/17/20 at 12:30 AM, and 50 mg of prednisone on 04/17/20 at 6:30 AM. Pt is also to take 50 mg of benadryl on 04/17/20 at 6:30 AM. Please call [phone] with any questions. Patient not taking: Reported on 12/19/2020 03/31/20   Logan Bores, MD  predniSONE (DELTASONE) 50 MG tablet Pt to take 50 mg of prednisone on 10/19/20 at 7:00 PM, 50 mg of prednisone on 10/20/20 at 1:00 AM, and 50 mg of prednisone on 10/20/20 at 7:00 AM. Pt is also to take 50 mg of benadryl on 10/20/20 at 7:00 AM. Please call [phone] with any questions. Patient not taking: Reported on 12/19/2020 10/06/20   Criselda Peaches, MD  predniSONE (DELTASONE) 50 MG tablet Pt to take 50 mg of prednisone on 12/04/20 at 7 pm, 50 mg  of prednisone on 12/05/20 at 1 AM, and 50 mg of prednisone on 12/05/20 at 7 AM. Pt is also to take 50 mg of benadryl on 12/05/20 at 7 AM. Please call [phone] with any questions. Patient not taking: Reported on 12/19/2020 12/01/20   Arne Cleveland, MD  predniSONE (DELTASONE) 50 MG tablet Pt to take 50 mg of prednisone on 04/09/21 at 7:00 PM, 50 mg of prednisone on 04/10/21 at 1:00 AM, and 50 mg of prednisone on 04/10/21 at 7:00 AM. Pt is also to take 50 mg of benadryl on 04/10/21 at 7:00 AM. Please call [phone] with any questions. 03/30/21  Paulina Fusi, MD  thiamine (VITAMIN B-1) 100 MG tablet Take 100 mg by mouth daily.    [provider]  valACYclovir (VALTREX) 500 MG tablet as needed. 11/04/18   [provider]  Vitamin D, Ergocalciferol, (DRISDOL) 1.25 MG (50000 UNIT) CAPS capsule Take 1 capsule (50,000 Units total) by mouth every 7 (seven) days. 01/10/20   Roswell Nickel, DO    Family History Family History  Problem Relation Age of Onset   Hyperlipidemia Mother    Hypertension Mother    Sleep apnea Mother    Obesity Mother    Hypertension Father    Hyperlipidemia Father    Heart attack Father    Sudden death Father    Alcoholism Father    Hyperlipidemia Brother    Hypertension Brother    Diabetes Neg Hx     Social History Social History   Tobacco Use   Smoking status: Never   Smokeless tobacco: Never  Vaping Use   Vaping Use: Never used  Substance Use Topics   Alcohol use: Yes    Comment: occ   Drug use: No     Allergies   Iodinated contrast media and Sulfa antibiotics   Review of Systems Review of Systems PER HPI  Physical Exam Triage Vital Signs ED Triage Vitals  Enc Vitals Group     BP 07/07/21 1827 134/74     Pulse Rate 07/07/21 1827 75     Resp 07/07/21 1827 18     Temp 07/07/21 1827 97.7 F (36.5 C)     Temp Source 07/07/21 1827 Oral     SpO2 07/07/21 1827 96 %     Weight --      Height --      Head Circumference --      Peak  Flow --      Pain Score 07/07/21 1828 8     Pain Loc --      Pain Edu? --      Excl. in GC? --    No data found.  Updated Vital Signs BP 134/74 (BP Location: Right Arm)   Pulse 75   Temp 97.7 F (36.5 C) (Oral)   Resp 18   SpO2 96%   Visual Acuity Right Eye Distance:   Left Eye Distance:   Bilateral Distance:    Right Eye Near:   Left Eye Near:    Bilateral Near:     Physical Exam Vitals and nursing note reviewed.  Constitutional:      Appearance: Normal appearance. She is not ill-appearing.  HENT:     Head: Atraumatic.  Eyes:     Extraocular Movements: Extraocular movements intact.     Conjunctiva/sclera: Conjunctivae normal.  Cardiovascular:     Rate and Rhythm: Normal rate and regular rhythm.     Heart sounds: Normal heart sounds.  Pulmonary:     Effort: Pulmonary effort is normal.     Breath sounds: Normal breath sounds.  Abdominal:     General: Bowel sounds are normal. There is no distension.     Palpations: Abdomen is soft.     Tenderness: There is no abdominal tenderness. There is no right CVA tenderness, left CVA tenderness or guarding.  Musculoskeletal:        General: Normal range of motion.     Cervical back: Normal range of motion and neck supple.  Skin:    General: Skin is warm and dry.  Neurological:     Mental Status: She is  alert and oriented to person, place, and time.  Psychiatric:        Mood and Affect: Mood normal.        Thought Content: Thought content normal.        Judgment: Judgment normal.    UC Treatments / Results  Labs (all labs ordered are listed, but only abnormal results are displayed) Labs Reviewed  POCT URINALYSIS DIP (MANUAL ENTRY) - Abnormal; Notable for the following components:      Result Value   Color, UA light yellow (*)    Clarity, UA cloudy (*)    Bilirubin, UA small (*)    Spec Grav, UA >=1.030 (*)    Blood, UA large (*)    Protein Ur, POC =100 (*)    Leukocytes, UA Large (3+) (*)    All other  components within normal limits  URINE CULTURE   EKG  Radiology No results found.  Procedures Procedures (including critical care time)  Medications Ordered in UC Medications - No data to display  Initial Impression / Assessment and Plan / UC Course  I have reviewed the triage vital signs and the nursing notes.  Pertinent labs & imaging results that were available during my care of the patient were reviewed by me and considered in my medical decision making (see chart for details).     Vitals and exam benign and reassuring, urinalysis showing evidence of urinary tract infection.  Urine culture pending, treat with Keflex, fluids and return for worsening symptoms.  Final Clinical Impressions(s) / UC Diagnoses   Final diagnoses:  Acute lower UTI   Discharge Instructions   None    ED Prescriptions     Medication Sig Dispense Auth. Provider   cephALEXin (KEFLEX) 500 MG capsule Take 1 capsule (500 mg total) by mouth 2 (two) times daily. 10 capsule Volney American, [place]      PDMP not reviewed this encounter.   Volney American, [place] 07/07/21 1958

## 2021-07-09 ENCOUNTER — Encounter (HOSPITAL_BASED_OUTPATIENT_CLINIC_OR_DEPARTMENT_OTHER): Payer: Self-pay | Admitting: Physical Therapy

## 2021-07-09 ENCOUNTER — Ambulatory Visit (HOSPITAL_BASED_OUTPATIENT_CLINIC_OR_DEPARTMENT_OTHER): Payer: 59 | Attending: Orthopedic Surgery | Admitting: Physical Therapy

## 2021-07-09 DIAGNOSIS — M25612 Stiffness of left shoulder, not elsewhere classified: Secondary | ICD-10-CM | POA: Insufficient documentation

## 2021-07-09 DIAGNOSIS — M6281 Muscle weakness (generalized): Secondary | ICD-10-CM | POA: Insufficient documentation

## 2021-07-09 DIAGNOSIS — M5459 Other low back pain: Secondary | ICD-10-CM | POA: Insufficient documentation

## 2021-07-09 DIAGNOSIS — R262 Difficulty in walking, not elsewhere classified: Secondary | ICD-10-CM | POA: Insufficient documentation

## 2021-07-09 DIAGNOSIS — R293 Abnormal posture: Secondary | ICD-10-CM | POA: Diagnosis present

## 2021-07-09 DIAGNOSIS — M25512 Pain in left shoulder: Secondary | ICD-10-CM | POA: Diagnosis present

## 2021-07-09 LAB — URINE CULTURE: Culture: 40000 — AB

## 2021-07-09 NOTE — Therapy (Signed)
OUTPATIENT PHYSICAL THERAPY THORACOLUMBAR TREATMENT   Patient Name: Melissa Roth MRN: [id number] DOB:03-23-1960, 61 y.o., female Today's Date: 07/09/2021   PT End of Session - 07/09/21 1454     Visit Number 5    Number of Visits 19    Date for PT Re-Evaluation 09/21/21    Authorization Type [employer]    PT Start Time 1446    PT Stop Time 1530    PT Time Calculation (min) 44 min    Activity Tolerance Patient tolerated treatment well    Behavior During Therapy WFL for tasks assessed/performed             Past Medical History:  Diagnosis Date   Back pain    Cervical disc disorder with radiculopathy of cervical region    PONV (postoperative nausea and vomiting)    Sciatica    Vitamin D deficiency    Past Surgical History:  Procedure Laterality Date   ABDOMINAL HYSTERECTOMY     CHOLECYSTECTOMY N/A 06/07/2017   Procedure: LAPAROSCOPIC CHOLECYSTECTOMY;  Surgeon: Jimmye Norman, MD;  Location: [facility] OR;  Service: General;  Laterality: N/A;   FOOT SURGERY     LUMBAR LAMINECTOMY/DECOMPRESSION MICRODISCECTOMY Left 11/10/2016   Procedure: Left L5-S1 discectomy;  Surgeon: Venita Lick, MD;  Location: [facility] OR;  Service: Orthopedics;  Laterality: Left;  120 mins   Patient Active Problem List   Diagnosis Date Noted   Family history of coronary artery disease 12/19/2019   Prediabetes 11/06/2019   Class 1 obesity due to excess calories with body mass index (BMI) of 32.0 to 32.9 in adult 11/05/2019   Bunion 01/19/2019   Pain in right knee 01/02/2019   Cervical radiculopathy 02/07/2018   Chronic pain syndrome 10/03/2017   Closed fracture of second toe of left foot 08/23/2017   DDD (degenerative disc disease), cervical 03/28/2017   Lumbar post-laminectomy syndrome 03/28/2017   Status post lumbar spine surgery for decompression of spinal cord 11/10/2016   Plantar fasciitis 02/03/2016   Left shoulder pain 12/26/2012   Low back pain 09/25/2012   Cervical pain (neck) 09/25/2012    PCP:  Jarrett Soho, PA-C  REFERRING PROVIDER: Venita Lick, MD  REFERRING DIAG: M54.50 (ICD-10-CM) - Low back pain, unspecified  Rationale for Evaluation and Treatment Rehabilitation  THERAPY DIAG:  Other low back pain  Difficulty walking  Muscle weakness (generalized)  ONSET DATE: 05/2021  SUBJECTIVE:                                                                                                                                                                                           SUBJECTIVE STATEMENT:  Pain is  btter only a 6/10 today. Continues to walk 2 miles daily but has slowed speed  PERTINENT HISTORY:   2018 Lumbar laminectomy/decompression microdiscectomy (Left) Pre-diabetic C/S radic   PAIN:  Are you having pain? Yes: NPRS scale: 6/10 Pain location: bilat lower lumbar back Pain description: dull "toothache", cramping Aggravating factors: sitting (1 hr at most), standing  Relieving factors: meds, walking   PRECAUTIONS: None  WEIGHT BEARING RESTRICTIONS No  FALLS:  Has patient fallen in last 6 months? No  LIVING ENVIRONMENT: Lives with: lives with their family Lives in: House/apartment Stairs: No Has following equipment at home: None  OCCUPATION: WFH; at a desk   PLOF: Independent  PATIENT GOALS :   OBJECTIVE: * Findings taken at [facility] unless otherwise noted.   DIAGNOSTIC FINDINGS:  N/A  PATIENT SURVEYS:  FOTO 53 63 @ DC 3 pts  SCREENING FOR RED FLAGS: Bowel or bladder incontinence: No Spinal tumors: No Cauda equina syndrome: No Compression fracture: No   COGNITION:  Overall cognitive status: Within functional limits for tasks assessed     SENSATION: Light touch: Impaired   POSTURE: decreased lumbar lordosis  PALPATION: TTP and hypertonicity of bilat L3-5 lumbar paraspinals; bilat QL; bilat SIJ, glutes, and hip rotators  LUMBAR ROM:   Active  A/PROM  eval  Flexion 50% p!  Extension 25% p!  Right lateral flexion 75%  p!  Left lateral flexion 75% p!  Right rotation 75% p!  Left rotation 75% p!   (Blank rows = not tested)  LOWER EXTREMITY ROM:   too sensitive,painful to test following AROM and lumbar testing, 7/10 pain  LOWER EXTREMITY MMT:    MMT Right eval Left eval  Hip flexion 4/5 4/5  Hip extension    Hip abduction 4/5 4/5  Hip adduction Too irritable to test Too irritable to test   (Blank rows = not tested)  LUMBAR SPECIAL TESTS:  Straight leg raise test: Positive, Slump test: Positive, SI Compression/distraction test: Positive, FABER test: Positive, and Trendelenburg sign: Positive  FUNCTIONAL TESTS:  5 times sit to stand: 13.2s   (significant increase in pain)  GAIT: Distance walked: 98ft  Assistive device utilized: None Level of assistance: Complete Independence Comments: Bilat Trendelenburg, decrease lumbar lordosis   TODAY'S TREATMENT  Pt seen for aquatic therapy today.  Treatment took place in water 3.25-4.8 ft in depth at the [facility] pool. Temp of water was 91.  Pt entered/exited the pool via stairs independently with bilat rail. Intro to aquatic setting.  In 29ft+ depth:   forward / backward walking without ue support Side stepping  with arms abdct / add  Lumbar flex stretch at steps holding to handrails 3x30s; added hip hiking R/L 2x30s Lumbar rotation R/l using kick board    Holding wall:  squats; heel/toe raises; hip abdct/add 2 x 10;  hip ext x 10 each leg; hip circle CW/CCW Sitting on squoodle: hip hiking; ant/post pelvic tilts; rotation Straddling yellow noodle, holding corner of pool wall:  cycling, hip abdct /add, and cc ski   Pt requires buoyancy for support and to offload joints with strengthening exercises. Viscosity of the water is needed for resistance of strengthening; water current perturbations provides challenge to standing balance unsupported, requiring increased core activation.    PATIENT EDUCATION:  Education details: aquatic intro,  DOMS expectations  Person educated: Patient Education method: explanation, demonstration Education comprehension: verbalized understanding, returned demonstration, verbal cues required, and tactile cues required   HOME EXERCISE PROGRAM:  Access Code: [email or web address] URL:  [email or web address] Date: 06/23/2021 Prepared by: Zebedee Iba  ASSESSMENT:  CLINICAL IMPRESSION: Overall pain has decreased upon initiation of session. Progressed submerged amb completing without UE support.  Focused on lumbosacral ROM/stretching. Pt with  less guarding able to control center of buoyancy improving ability to balance sitting on noodle.  Reduced LBP to 0/10 at end of session. Goals ongoing    OBJECTIVE IMPAIRMENTS Abnormal gait, decreased activity tolerance, decreased balance, decreased mobility, difficulty walking, decreased ROM, decreased strength, hypomobility, increased muscle spasms, impaired flexibility, impaired sensation, improper body mechanics, postural dysfunction, and pain.   ACTIVITY LIMITATIONS carrying, lifting, bending, sitting, standing, squatting, sleeping, stairs, transfers, and locomotion level  PARTICIPATION LIMITATIONS: cleaning, laundry, interpersonal relationship, driving, shopping, community activity, yard work, and exercise  PERSONAL FACTORS Age, Fitness, Time since onset of injury/illness/exacerbation, and 1-2 comorbidities:    are also affecting patient's functional outcome.   REHAB POTENTIAL: Fair    CLINICAL DECISION MAKING: Evolving/moderate complexity  EVALUATION COMPLEXITY: Moderate   GOALS:  SHORT TERM GOALS: Target date: 08/04/2021  Pt will become independent with HEP in order to demonstrate synthesis of PT education.   Goal status: INITIAL  2.  Pt will report at least 2 pt reduction on NPRS scale for pain in order to demonstrate functional improvement with household activity, self care, and ADL.   Goal status: INITIAL  3.   Pt will score at least  3 pt increase on FOTO to demonstrate functional improvement in MCII and pt perceived function.    Goal status: INITIAL    LONG TERM GOALS: Target date: 09/15/2021  Pt  will become independent with final HEP in order to demonstrate synthesis of PT education.  Goal status: INITIAL  2.  Pt will score >/= 63 on FOTO to demonstrate improvement in perceived lumbar function.  Goal status: INITIAL  3.  Pt will be able to perform 5XSTS in under 12s  in order to demonstrate functional improvement above the cut off score for adults.   Goal status: INITIAL  4.  Pt will be able to demonstrate/report ability to sit/stand/sleep for extended periods of time without pain in order to demonstrate functional improvement and tolerance to static positioning.    Goal status: INITIAL    PLAN: PT FREQUENCY: 1-2x/week  PT DURATION: 12 weeks (likely DC in 8 wks)  PLANNED INTERVENTIONS: Therapeutic exercises, Therapeutic activity, Neuromuscular re-education, Balance training, Gait training, Patient/Family education, Joint manipulation, Joint mobilization, Stair training, Orthotic/Fit training, DME instructions, Aquatic Therapy, Dry Needling, Electrical stimulation, Spinal manipulation, Spinal mobilization, Cryotherapy, Moist heat, scar mobilization, Splintting, Taping, Vasopneumatic device, Traction, Ultrasound, Ionotophoresis 4mg /ml Dexamethasone, Manual therapy, and Re-evaluation.  PLAN FOR NEXT SESSION: continue aquatics, lumbar ROM/flexbility,  lumbopelvic strength, hip stability  (Frankie) Keyonna Comunale MPT 07/09/21 2:56 PM

## 2021-07-15 ENCOUNTER — Ambulatory Visit (HOSPITAL_BASED_OUTPATIENT_CLINIC_OR_DEPARTMENT_OTHER): Payer: 59 | Admitting: Physical Therapy

## 2021-07-16 ENCOUNTER — Ambulatory Visit (HOSPITAL_BASED_OUTPATIENT_CLINIC_OR_DEPARTMENT_OTHER): Payer: 59 | Admitting: Physical Therapy

## 2021-07-17 ENCOUNTER — Encounter (HOSPITAL_BASED_OUTPATIENT_CLINIC_OR_DEPARTMENT_OTHER): Payer: Self-pay | Admitting: Physical Therapy

## 2021-07-17 ENCOUNTER — Ambulatory Visit (HOSPITAL_BASED_OUTPATIENT_CLINIC_OR_DEPARTMENT_OTHER): Payer: 59 | Admitting: Physical Therapy

## 2021-07-17 DIAGNOSIS — M5459 Other low back pain: Secondary | ICD-10-CM | POA: Diagnosis not present

## 2021-07-17 DIAGNOSIS — M6281 Muscle weakness (generalized): Secondary | ICD-10-CM

## 2021-07-17 DIAGNOSIS — R262 Difficulty in walking, not elsewhere classified: Secondary | ICD-10-CM

## 2021-07-17 NOTE — Therapy (Signed)
OUTPATIENT PHYSICAL THERAPY THORACOLUMBAR TREATMENT   Patient Name: Melissa Roth MRN: [id number] DOB:[birth year], 61 y.o., female Today's Date: 07/17/2021   PT End of Session - 07/17/21 0835     Visit Number 6    Number of Visits 19    Date for PT Re-Evaluation 09/21/21    Authorization Type [employer]    PT Start Time [phone]    PT Stop Time 0854    PT Time Calculation (min) 40 min    Activity Tolerance Patient tolerated treatment well    Behavior During Therapy WFL for tasks assessed/performed             Past Medical History:  Diagnosis Date   Back pain    Cervical disc disorder with radiculopathy of cervical region    PONV (postoperative nausea and vomiting)    Sciatica    Vitamin D deficiency    Past Surgical History:  Procedure Laterality Date   ABDOMINAL HYSTERECTOMY     CHOLECYSTECTOMY N/A 06/07/2017   Procedure: LAPAROSCOPIC CHOLECYSTECTOMY;  Surgeon: Jimmye Norman, MD;  Location: [facility] OR;  Service: General;  Laterality: N/A;   FOOT SURGERY     LUMBAR LAMINECTOMY/DECOMPRESSION MICRODISCECTOMY Left 11/10/2016   Procedure: Left L5-S1 discectomy;  Surgeon: Venita Lick, MD;  Location: [facility] OR;  Service: Orthopedics;  Laterality: Left;  120 mins   Patient Active Problem List   Diagnosis Date Noted   Family history of coronary artery disease 12/19/2019   Prediabetes 11/06/2019   Class 1 obesity due to excess calories with body mass index (BMI) of 32.0 to 32.9 in adult 11/05/2019   Bunion 01/19/2019   Pain in right knee 01/02/2019   Cervical radiculopathy 02/07/2018   Chronic pain syndrome 10/03/2017   Closed fracture of second toe of left foot 08/23/2017   DDD (degenerative disc disease), cervical 03/28/2017   Lumbar post-laminectomy syndrome 03/28/2017   Status post lumbar spine surgery for decompression of spinal cord 11/10/2016   Plantar fasciitis 02/03/2016   Left shoulder pain 12/26/2012   Low back pain 09/25/2012   Cervical pain (neck) 09/25/2012    PCP:  Jarrett Soho, PA-C  REFERRING PROVIDER: Venita Lick, MD  REFERRING DIAG: M54.50 (ICD-10-CM) - Low back pain, unspecified  Rationale for Evaluation and Treatment Rehabilitation  THERAPY DIAG:  Other low back pain  Difficulty walking  Muscle weakness (generalized)  ONSET DATE: 05/2021  SUBJECTIVE:                                                                                                                                                                                           SUBJECTIVE STATEMENT:  Pt reports  that the weather has affected her.  She was only able to walk 1x this week due to heat; walked at a slower pace this time.  She had follow up with Ortho dr; she thinks the aquatics is helping "I wish I could take the water with me".   PERTINENT HISTORY:   2018 Lumbar laminectomy/decompression microdiscectomy (Left) Pre-diabetic C/S radic   PAIN:  Are you having pain? Yes: NPRS scale: 4-5/10 Pain location: lower neck, bilat Pain description: dull , tight Aggravating factors: turning head right Relieving factors: meds   PRECAUTIONS: None  WEIGHT BEARING RESTRICTIONS No  FALLS:  Has patient fallen in last 6 months? No  LIVING ENVIRONMENT: Lives with: lives with their family Lives in: House/apartment Stairs: No Has following equipment at home: None  OCCUPATION: WFH; at a desk   PLOF: Independent  PATIENT GOALS :   OBJECTIVE: * Findings taken at [facility] unless otherwise noted.   DIAGNOSTIC FINDINGS:  N/A  PATIENT SURVEYS:  FOTO 53 63 @ DC 3 pts  SCREENING FOR RED FLAGS: Bowel or bladder incontinence: No Spinal tumors: No Cauda equina syndrome: No Compression fracture: No   COGNITION:  Overall cognitive status: Within functional limits for tasks assessed     SENSATION: Light touch: Impaired   POSTURE: decreased lumbar lordosis  PALPATION: TTP and hypertonicity of bilat L3-5 lumbar paraspinals; bilat QL; bilat SIJ, glutes, and  hip rotators  LUMBAR ROM:   Active  A/PROM  eval  Flexion 50% p!  Extension 25% p!  Right lateral flexion 75% p!  Left lateral flexion 75% p!  Right rotation 75% p!  Left rotation 75% p!   (Blank rows = not tested)  LOWER EXTREMITY ROM:   too sensitive,painful to test following AROM and lumbar testing, 7/10 pain  LOWER EXTREMITY MMT:    MMT Right eval Left eval  Hip flexion 4/5 4/5  Hip extension    Hip abduction 4/5 4/5  Hip adduction Too irritable to test Too irritable to test   (Blank rows = not tested)  LUMBAR SPECIAL TESTS:  Straight leg raise test: Positive, Slump test: Positive, SI Compression/distraction test: Positive, FABER test: Positive, and Trendelenburg sign: Positive  FUNCTIONAL TESTS:  5 times sit to stand: 13.2s   (significant increase in pain)  GAIT: Distance walked: 57ft  Assistive device utilized: None Level of assistance: Complete Independence Comments: Bilat Trendelenburg, decrease lumbar lordosis   TODAY'S TREATMENT  Pt seen for aquatic therapy today.  Treatment took place in water 3.25-4.8 ft in depth at the [facility] pool. Temp of water was 91.  Pt entered/exited the pool via stairs independently with bilat rail.  In 26ft+ depth:   forward / backward walking without UE support Side stepping  with arms abdct / add  Straddling yellow noodle, holding yellow hand buoys:   cycling; holding corner of wall: hip abdct /add, and cc ski Blue noodle pull downs to thighs x 12 High knee marching forward / backward Holding rainbow hand buoys under water at side: forward / backward walk (challenge) Holding wall:  hip circle CW (limited on LLE) Return to walking forward/ backward  Shoulder abdct/add with wide stance, core engagement and cues for scapular positioning Kick board push/ pull at 11, 12, and 1 o'clock position, kick board vertical and 50% submerged, staggered stance L stretch holding railings L/R hamstring stretch with foot on  2nd step  Pt requires buoyancy for support and to offload joints with strengthening exercises. Viscosity of the water is needed  for resistance of strengthening; water current perturbations provides challenge to standing balance unsupported, requiring increased core activation.  PATIENT EDUCATION:  Education details: aquatic intro, DOMS expectations  Person educated: Patient Education method: explanation, demonstration Education comprehension: verbalized understanding, returned demonstration, verbal cues required, and tactile cues required   HOME EXERCISE PROGRAM:  Access Code: [email or web address] URL: [email or web address] Date: 06/23/2021 Prepared by: Zebedee Iba  ASSESSMENT:  CLINICAL IMPRESSION: Pt arrives with neck pain, but no low back pain.  She was able to ambulate without UE support, but keeps LE more rigid without support. She reported pain in Lt low back/SI region when attempting Lt hip ext past neutral. Some compensating of hip hiking with L hip abdct and hip ext.  Pt able to control center of buoyancy while sitting on yellow noodle with cycling, but needed to hold wall with hip abdct and CC ski motion. She requires visual cues and demo for proper execution of exercises and increased awareness of posture. Pt progressing gradually towards all goals.    OBJECTIVE IMPAIRMENTS Abnormal gait, decreased activity tolerance, decreased balance, decreased mobility, difficulty walking, decreased ROM, decreased strength, hypomobility, increased muscle spasms, impaired flexibility, impaired sensation, improper body mechanics, postural dysfunction, and pain.   ACTIVITY LIMITATIONS carrying, lifting, bending, sitting, standing, squatting, sleeping, stairs, transfers, and locomotion level  PARTICIPATION LIMITATIONS: cleaning, laundry, interpersonal relationship, driving, shopping, community activity, yard work, and exercise  PERSONAL FACTORS Age, Fitness, Time since onset of  injury/illness/exacerbation, and 1-2 comorbidities:    are also affecting patient's functional outcome.   REHAB POTENTIAL: Fair    CLINICAL DECISION MAKING: Evolving/moderate complexity  EVALUATION COMPLEXITY: Moderate   GOALS:  SHORT TERM GOALS: Target date: 08/04/2021  Pt will become independent with HEP in order to demonstrate synthesis of PT education.   Goal status: INITIAL  2.  Pt will report at least 2 pt reduction on NPRS scale for pain in order to demonstrate functional improvement with household activity, self care, and ADL.   Goal status: INITIAL  3.   Pt will score at least 3 pt increase on FOTO to demonstrate functional improvement in MCII and pt perceived function.    Goal status: INITIAL    LONG TERM GOALS: Target date: 09/15/2021  Pt  will become independent with final HEP in order to demonstrate synthesis of PT education.  Goal status: INITIAL  2.  Pt will score >/= 63 on FOTO to demonstrate improvement in perceived lumbar function.  Goal status: INITIAL  3.  Pt will be able to perform 5XSTS in under 12s  in order to demonstrate functional improvement above the cut off score for adults.   Goal status: INITIAL  4.  Pt will be able to demonstrate/report ability to sit/stand/sleep for extended periods of time without pain in order to demonstrate functional improvement and tolerance to static positioning.    Goal status: INITIAL    PLAN: PT FREQUENCY: 1-2x/week  PT DURATION: 12 weeks (likely DC in 8 wks)  PLANNED INTERVENTIONS: Therapeutic exercises, Therapeutic activity, Neuromuscular re-education, Balance training, Gait training, Patient/Family education, Joint manipulation, Joint mobilization, Stair training, Orthotic/Fit training, DME instructions, Aquatic Therapy, Dry Needling, Electrical stimulation, Spinal manipulation, Spinal mobilization, Cryotherapy, Moist heat, scar mobilization, Splintting, Taping, Vasopneumatic device, Traction, Ultrasound,  Ionotophoresis 4mg /ml Dexamethasone, Manual therapy, and Re-evaluation.  PLAN FOR NEXT SESSION: continue aquatics, lumbar ROM/flexbility,  lumbopelvic strength, hip stability  , PTA 07/17/21 9:03 AM

## 2021-07-21 ENCOUNTER — Encounter (HOSPITAL_BASED_OUTPATIENT_CLINIC_OR_DEPARTMENT_OTHER): Payer: Self-pay | Admitting: Physical Therapy

## 2021-07-21 ENCOUNTER — Ambulatory Visit (HOSPITAL_BASED_OUTPATIENT_CLINIC_OR_DEPARTMENT_OTHER): Payer: 59 | Admitting: Physical Therapy

## 2021-07-21 DIAGNOSIS — R262 Difficulty in walking, not elsewhere classified: Secondary | ICD-10-CM

## 2021-07-21 DIAGNOSIS — M5459 Other low back pain: Secondary | ICD-10-CM

## 2021-07-21 DIAGNOSIS — M6281 Muscle weakness (generalized): Secondary | ICD-10-CM

## 2021-07-21 NOTE — Therapy (Signed)
OUTPATIENT PHYSICAL THERAPY THORACOLUMBAR TREATMENT   Patient Name: Melissa Roth MRN: [id number] DOB:03-01-1960, 61 y.o., female Today's Date: 07/21/2021   PT End of Session - 07/21/21 0823     Visit Number 7    Number of Visits 19    Date for PT Re-Evaluation 09/21/21    Authorization Type [employer]    PT Start Time 0818    PT Stop Time 0857    PT Time Calculation (min) 39 min    Activity Tolerance Patient tolerated treatment well    Behavior During Therapy WFL for tasks assessed/performed             Past Medical History:  Diagnosis Date   Back pain    Cervical disc disorder with radiculopathy of cervical region    PONV (postoperative nausea and vomiting)    Sciatica    Vitamin D deficiency    Past Surgical History:  Procedure Laterality Date   ABDOMINAL HYSTERECTOMY     CHOLECYSTECTOMY N/A 06/07/2017   Procedure: LAPAROSCOPIC CHOLECYSTECTOMY;  Surgeon: Judeth Horn, MD;  Location: [address];  Service: General;  Laterality: N/A;   FOOT SURGERY     LUMBAR LAMINECTOMY/DECOMPRESSION MICRODISCECTOMY Left 11/10/2016   Procedure: Left L5-S1 discectomy;  Surgeon: Melina Schools, MD;  Location: [place];  Service: Orthopedics;  Laterality: Left;  120 mins   Patient Active Problem List   Diagnosis Date Noted   Family history of coronary artery disease 12/19/2019   Prediabetes 11/06/2019   Class 1 obesity due to excess calories with body mass index (BMI) of 32.0 to 32.9 in adult 11/05/2019   Bunion 01/19/2019   Pain in right knee 01/02/2019   Cervical radiculopathy 02/07/2018   Chronic pain syndrome 10/03/2017   Closed fracture of second toe of left foot 08/23/2017   DDD (degenerative disc disease), cervical 03/28/2017   Lumbar post-laminectomy syndrome 03/28/2017   Status post lumbar spine surgery for decompression of spinal cord 11/10/2016   Plantar fasciitis 02/03/2016   Left shoulder pain 12/26/2012   Low back pain 09/25/2012   Cervical pain (neck) 09/25/2012    PCP:  Marda Stalker, PA-C  REFERRING PROVIDER: Melina Schools, MD  REFERRING DIAG: M54.50 (ICD-10-CM) - Low back pain, unspecified  Rationale for Evaluation and Treatment Rehabilitation  THERAPY DIAG:  Other low back pain  Difficulty walking  Muscle weakness (generalized)  ONSET DATE: 05/2021  SUBJECTIVE:                                                                                                                                                                                           SUBJECTIVE STATEMENT:  Pt reports  she was sore in hips and legs after last session.  She reports that her pain has "decreased by 80%" since starting therapy, "I was every bit of a 10 when I started"  PERTINENT HISTORY:   2018 Lumbar laminectomy/decompression microdiscectomy (Left) Pre-diabetic C/S radic   PAIN:  Are you having pain? Yes: NPRS scale: 4-5/10 Pain location: lower neck, bilat Pain description: dull , tight Aggravating factors: turning head right Relieving factors: meds   PRECAUTIONS: None  WEIGHT BEARING RESTRICTIONS No  FALLS:  Has patient fallen in last 6 months? No  LIVING ENVIRONMENT: Lives with: lives with their family Lives in: House/apartment Stairs: No Has following equipment at home: None  OCCUPATION: [employer]; at a desk   PLOF: Independent  PATIENT GOALS :   OBJECTIVE: * Findings taken at [facility] unless otherwise noted.   DIAGNOSTIC FINDINGS:  N/A  PATIENT SURVEYS:  FOTO 53 63 @ DC 3 pts  SCREENING FOR RED FLAGS: Bowel or bladder incontinence: No Spinal tumors: No Cauda equina syndrome: No Compression fracture: No   COGNITION:  Overall cognitive status: Within functional limits for tasks assessed     SENSATION: Light touch: Impaired   POSTURE: decreased lumbar lordosis  PALPATION: TTP and hypertonicity of bilat L3-5 lumbar paraspinals; bilat QL; bilat SIJ, glutes, and hip rotators  LUMBAR ROM:   Active  A/PROM  eval  Flexion 50% p!   Extension 25% p!  Right lateral flexion 75% p!  Left lateral flexion 75% p!  Right rotation 75% p!  Left rotation 75% p!   (Blank rows = not tested)  LOWER EXTREMITY ROM:   too sensitive,painful to test following AROM and lumbar testing, 7/10 pain  LOWER EXTREMITY MMT:    MMT Right eval Left eval  Hip flexion 4/5 4/5  Hip extension    Hip abduction 4/5 4/5  Hip adduction Too irritable to test Too irritable to test   (Blank rows = not tested)  LUMBAR SPECIAL TESTS:  Straight leg raise test: Positive, Slump test: Positive, SI Compression/distraction test: Positive, FABER test: Positive, and Trendelenburg sign: Positive  FUNCTIONAL TESTS:  5 times sit to stand: 13.2s   (significant increase in pain)  GAIT: Distance walked: 26f  Assistive device utilized: None Level of assistance: Complete Independence Comments: Bilat Trendelenburg, decrease lumbar lordosis   TODAY'S TREATMENT  Pt seen for aquatic therapy today.  Treatment took place in water 3.25-4.8 ft in [facility] Temp of water was 91.  Pt entered/exited the pool via stairs independently with bilat rail.  In 443f depth:   forward / backward walking with yellow noodle, for more relaxed/confident gait Side stepping  with arms abdct / add  Warrior 1 ifting thin squoodle off of the surface, 5x each leg forward Thin squoodle pull downs to thighs x 8 Straddling yellow noodle, holding yellow hand buoys:   cycling; hip abdct /add L stretch holding railing (difficult to attain a stretch) Seated on 4th step - forward stretch holding kickboard - then again to side STS x 8, with cues for controlled descent and ab set Holding rainbow hand buoys under water at side: forward / backward walk (challenge) High knee march backward/forward without support, then with added reciprocal arm swing L/R hamstring stretch with foot on 2nd step  Pt requires buoyancy for support and to offload joints with  strengthening exercises. Viscosity of the water is needed for resistance of strengthening; water current perturbations provides challenge to standing balance unsupported, requiring increased core activation.  PATIENT EDUCATION:  Education details: aquatic progression, DOMS expectations  Person educated: Patient Education method: explanation, demonstration Education comprehension: verbalized understanding, returned demonstration, verbal cues required, and tactile cues required   HOME EXERCISE PROGRAM:  Access Code: [email or web address] URL: [email or web address] Date: 06/23/2021 Prepared by: Daleen Bo  ASSESSMENT:  CLINICAL IMPRESSION: Pt reporting improvement in pain level; has met STG 2. She was able to complete suspended hip bilat abdct/add without holding wall and tolerated increased resistance with ab set noodle pull downs. She is becoming more confident in water and less guarded. She requires visual cues and demo for proper execution of exercises and increased awareness of posture. Pt progressing gradually towards all goals.    OBJECTIVE IMPAIRMENTS Abnormal gait, decreased activity tolerance, decreased balance, decreased mobility, difficulty walking, decreased ROM, decreased strength, hypomobility, increased muscle spasms, impaired flexibility, impaired sensation, improper body mechanics, postural dysfunction, and pain.   ACTIVITY LIMITATIONS carrying, lifting, bending, sitting, standing, squatting, sleeping, stairs, transfers, and locomotion level  PARTICIPATION LIMITATIONS: cleaning, laundry, interpersonal relationship, driving, shopping, community activity, yard work, and exercise  PERSONAL FACTORS Age, Fitness, Time since onset of injury/illness/exacerbation, and 1-2 comorbidities:    are also affecting patient's functional outcome.   REHAB POTENTIAL: Fair    CLINICAL DECISION MAKING: Evolving/moderate complexity  EVALUATION COMPLEXITY: Moderate   GOALS:  SHORT  TERM GOALS: Target date: 08/04/2021  Pt will become independent with HEP in order to demonstrate synthesis of PT education.   Goal status: INITIAL  2.  Pt will report at least 2 pt reduction on NPRS scale for pain in order to demonstrate functional improvement with household activity, self care, and ADL.   Goal status: Achieved - 07/21/21  3.   Pt will score at least 3 pt increase on FOTO to demonstrate functional improvement in MCII and pt perceived function.    Goal status: INITIAL    LONG TERM GOALS: Target date: 09/15/2021  Pt  will become independent with final HEP in order to demonstrate synthesis of PT education.  Goal status: INITIAL  2.  Pt will score >/= 63 on FOTO to demonstrate improvement in perceived lumbar function.  Goal status: INITIAL  3.  Pt will be able to perform 5XSTS in under 12s  in order to demonstrate functional improvement above the cut off score for adults.   Goal status: INITIAL  4.  Pt will be able to demonstrate/report ability to sit/stand/sleep for extended periods of time without pain in order to demonstrate functional improvement and tolerance to static positioning.    Goal status: INITIAL    PLAN: PT FREQUENCY: 1-2x/week  PT DURATION: 12 weeks (likely DC in 8 wks)  PLANNED INTERVENTIONS: Therapeutic exercises, Therapeutic activity, Neuromuscular re-education, Balance training, Gait training, Patient/Family education, Joint manipulation, Joint mobilization, Stair training, Orthotic/Fit training, DME instructions, Aquatic Therapy, Dry Needling, Electrical stimulation, Spinal manipulation, Spinal mobilization, Cryotherapy, Moist heat, scar mobilization, Splintting, Taping, Vasopneumatic device, Traction, Ultrasound, Ionotophoresis 85m/ml Dexamethasone, Manual therapy, and Re-evaluation.  PLAN FOR NEXT SESSION: continue aquatics, lumbar ROM/flexbility,  lumbopelvic strength, hip stability  JKerin Perna PTA 07/21/21 11:08 AM

## 2021-07-23 ENCOUNTER — Ambulatory Visit (HOSPITAL_BASED_OUTPATIENT_CLINIC_OR_DEPARTMENT_OTHER): Payer: 59 | Admitting: Physical Therapy

## 2021-07-24 ENCOUNTER — Emergency Department (HOSPITAL_BASED_OUTPATIENT_CLINIC_OR_DEPARTMENT_OTHER)
Admission: EM | Admit: 2021-07-24 | Discharge: 2021-07-24 | Disposition: A | Discharge status: Home / Self Care | Payer: 59 | Attending: Emergency Medicine | Admitting: Emergency Medicine

## 2021-07-24 ENCOUNTER — Other Ambulatory Visit: Payer: Self-pay

## 2021-07-24 ENCOUNTER — Ambulatory Visit (HOSPITAL_BASED_OUTPATIENT_CLINIC_OR_DEPARTMENT_OTHER): Payer: 59 | Admitting: Physical Therapy

## 2021-07-24 ENCOUNTER — Encounter (HOSPITAL_BASED_OUTPATIENT_CLINIC_OR_DEPARTMENT_OTHER): Payer: Self-pay | Admitting: Emergency Medicine

## 2021-07-24 DIAGNOSIS — M79602 Pain in left arm: Secondary | ICD-10-CM | POA: Diagnosis not present

## 2021-07-24 DIAGNOSIS — M25512 Pain in left shoulder: Secondary | ICD-10-CM | POA: Diagnosis not present

## 2021-07-24 MED ORDER — OXYCODONE-ACETAMINOPHEN 5-325 MG PO TABS: 1.0000 | ORAL_TABLET | Freq: Four times a day (QID) | ORAL | 0 refills | Status: AC | PRN

## 2021-07-24 MED ORDER — OXYCODONE-ACETAMINOPHEN 5-325 MG PO TABS
1.0000 | ORAL_TABLET | Freq: Once | ORAL | Status: AC
  Administered 2021-07-24: 1 via ORAL
  Filled 2021-07-24: qty 1

## 2021-07-24 MED ORDER — IBUPROFEN 800 MG PO TABS
800.0000 mg | ORAL_TABLET | Freq: Once | ORAL | Status: AC
  Administered 2021-07-24: 800 mg via ORAL
  Filled 2021-07-24: qty 1

## 2021-07-24 MED ORDER — DEXAMETHASONE SODIUM PHOSPHATE 10 MG/ML IJ SOLN
10.0000 mg | Freq: Once | INTRAMUSCULAR | Status: AC | PRN
  Administered 2021-07-24: 10 mg via INTRAMUSCULAR
  Filled 2021-07-24: qty 1

## 2021-07-24 MED ORDER — IBUPROFEN 600 MG PO TABS: 600.0000 mg | ORAL_TABLET | Freq: Three times a day (TID) | ORAL | 0 refills | Status: DC | PRN

## 2021-07-24 NOTE — Discharge Instructions (Addendum)
You can wear the shoulder sling for the next day or 2 to help with your pain.  You will need to follow-up with an orthopedic doctor as you may have for frozen shoulder syndrome or bursitis in your shoulder.  Generally I would recommend that you try to maintain some mobility in your shoulder, not to wear the sling all day moving forward.  You can perform gentle exercises by rolling your shoulder and gently stretching your arms outwards and then slowly upwards.

## 2021-07-24 NOTE — ED Provider Notes (Signed)
[facility] EMERGENCY DEPT Provider Note   CSN: [id number] Arrival date & time: 07/24/21  4008     History  Chief Complaint  Patient presents with   Arm Pain    Melissa Roth is a 61 y.o. female presenting to emergency department left arm pain.  The patient ports she woke up this morning with significant pain in her left shoulder, worse with movement.  The pain is quite severe.  She says it feels reminiscent of when she had a bursitis in her left shoulder for which she needed local steroid injections at an orthopedic clinic.  That was many years ago.  She denies overuse of her arms with overhead movement.  She works generally on a [employer].  She reports no active chest pain or pressure  HPI     Home Medications Prior to Admission medications   Medication Sig Start Date End Date Taking? Authorizing Provider  ibuprofen (ADVIL) 600 MG tablet Take 1 tablet (600 mg total) by mouth every 8 (eight) hours as needed for up to 21 doses for mild pain or moderate pain. 07/24/21  Yes Giomar Gusler, Kermit Balo, MD  oxyCODONE-acetaminophen (PERCOCET/ROXICET) 5-325 MG tablet Take 1 tablet by mouth every 6 (six) hours as needed for up to 10 doses for severe pain. 07/24/21  Yes Rikayla Demmon, Kermit Balo, MD  baclofen (LIORESAL) 10 MG tablet as needed. 05/04/18   [provider]  benzonatate (TESSALON) 100 MG capsule Take 100 mg by mouth as needed. 11/27/19   [provider]  buPROPion (WELLBUTRIN XL) 150 MG 24 hr tablet Take 150 mg by mouth as needed. 08/08/20   [provider]  cephALEXin (KEFLEX) 500 MG capsule Take 1 capsule (500 mg total) by mouth 2 (two) times daily. 07/07/21   Particia Nearing, PA-C  diclofenac (VOLTAREN) 75 MG EC tablet as needed.    [provider]  diphenhydrAMINE (BENADRYL) 50 MG tablet Take 1 tablet (50 mg total) by mouth once for 1 dose. Pt to take 50 mg of benadryl on 12/05/20 at 7 AM. Please call [phone] with any questions. Patient  not taking: Reported on 12/19/2020 12/01/20 12/01/20  Oley Balm, MD  diphenhydrAMINE (BENADRYL) 50 MG tablet Take 1 tablet (50 mg total) by mouth once for 1 dose. Pt is to take 50 mg of benadryl on 04/10/21 at 7:00 AM. Please call [phone] with any questions. 03/30/21 03/30/21  Paulina Fusi, MD  estradiol (ESTRACE) 0.1 MG/GM vaginal cream as needed. 08/14/18   [provider]  fluconazole (DIFLUCAN) 150 MG tablet Take by mouth as needed. 07/11/20   [provider]  gabapentin (NEURONTIN) 300 MG capsule Take 300 mg by mouth as needed. 02/25/20   [provider]  HYDROcodone-acetaminophen (NORCO/VICODIN) 5-325 MG tablet as needed.    [provider]  ipratropium (ATROVENT) 0.03 % nasal spray as needed.    [provider]  methocarbamol (ROBAXIN) 500 MG tablet as needed.    [provider]  Multiple Vitamins-Minerals (MULTIVITAMIN WITH MINERALS) tablet Take 1 tablet by mouth daily.    [provider]  nitrofurantoin, macrocrystal-monohydrate, (MACROBID) 100 MG capsule Take 100 mg by mouth 2 (two) times daily. Patient not taking: Reported on 12/19/2020 07/04/20   [provider]  predniSONE (DELTASONE) 50 MG tablet Take Prednisone 50 mg by mouth on 12/07/19 at 12:30a.m., 6:30a.m. and 12:30p.m.; take Benadryl 50 mg by mouth on 12/07/19 at 12:30p.m. Patient taking differently: as needed. Take Prednisone 50 mg by mouth on 12/07/19 at 12:30a.m.,  6:30a.m. and 12:30p.m.; take Benadryl 50 mg by mouth on 12/07/19 at 12:30p.m. 11/26/19   Sebastian Ache, MD  predniSONE (DELTASONE) 50 MG tablet Pt to take 50 mg of prednisone on 03/31/20 at 0030, 50 mg of prednisone on 03/31/20 at 0630, and 50 mg of prednisone on 03/31/20 at 1230. Pt is also to take 50 mg of benadryl on 03/31/20 at 1230. Please call [phone] with any questions. Patient not taking: Reported on 12/19/2020 03/27/20   Paulina Fusi, MD  predniSONE (DELTASONE) 50 MG tablet Pt to take 50 mg of  prednisone on 04/16/20 at 6:30 PM, 50 mg of prednisone on 04/17/20 at 12:30 AM, and 50 mg of prednisone on 04/17/20 at 6:30 AM. Pt is also to take 50 mg of benadryl on 04/17/20 at 6:30 AM. Please call [phone] with any questions. Patient not taking: Reported on 12/19/2020 03/31/20   Sebastian Ache, MD  predniSONE (DELTASONE) 50 MG tablet Pt to take 50 mg of prednisone on 10/19/20 at 7:00 PM, 50 mg of prednisone on 10/20/20 at 1:00 AM, and 50 mg of prednisone on 10/20/20 at 7:00 AM. Pt is also to take 50 mg of benadryl on 10/20/20 at 7:00 AM. Please call [phone] with any questions. Patient not taking: Reported on 12/19/2020 10/06/20   Sterling Big, MD  predniSONE (DELTASONE) 50 MG tablet Pt to take 50 mg of prednisone on 12/04/20 at 7 pm, 50 mg of prednisone on 12/05/20 at 1 AM, and 50 mg of prednisone on 12/05/20 at 7 AM. Pt is also to take 50 mg of benadryl on 12/05/20 at 7 AM. Please call [phone] with any questions. Patient not taking: Reported on 12/19/2020 12/01/20   Oley Balm, MD  predniSONE (DELTASONE) 50 MG tablet Pt to take 50 mg of prednisone on 04/09/21 at 7:00 PM, 50 mg of prednisone on 04/10/21 at 1:00 AM, and 50 mg of prednisone on 04/10/21 at 7:00 AM. Pt is also to take 50 mg of benadryl on 04/10/21 at 7:00 AM. Please call [phone] with any questions. 03/30/21   Paulina Fusi, MD  thiamine (VITAMIN B-1) 100 MG tablet Take 100 mg by mouth daily.    [provider]  valACYclovir (VALTREX) 500 MG tablet as needed. 11/04/18   [provider]  Vitamin D, Ergocalciferol, (DRISDOL) 1.25 MG (50000 UNIT) CAPS capsule Take 1 capsule (50,000 Units total) by mouth every 7 (seven) days. 01/10/20   Corinna Capra A, DO      Allergies    Iodinated contrast media and Sulfa antibiotics    Review of Systems   Review of Systems  Physical Exam Updated Vital Signs BP (!) 149/91 (BP Location: Right Arm)   Pulse 84   Temp 98.2 F (36.8 C) (Oral)   Resp 18   Ht 5\' 4"  (1.626 m)    Wt 85.7 kg   SpO2 100%   BMI 32.44 kg/m  Physical Exam Constitutional:      General: She is not in acute distress. HENT:     Head: Normocephalic and atraumatic.  Eyes:     Conjunctiva/sclera: Conjunctivae normal.     Pupils: Pupils are equal, round, and reactive to light.  Cardiovascular:     Rate and Rhythm: Normal rate and regular rhythm.  Pulmonary:     Effort: Pulmonary effort is normal. No respiratory distress.  Abdominal:     General: There is no distension.     Tenderness: There is no abdominal tenderness.  Musculoskeletal:     Comments: Suprascapular  tenderness and tenderness of the bursa of the left shoulder, significantly worse with abduction of the shoulder, limited ability to overhead arm raise due to pain in shoulder.  Skin:    General: Skin is warm and dry.  Neurological:     General: No focal deficit present.     Mental Status: She is alert and oriented to person, place, and time. Mental status is at baseline.     Sensory: No sensory deficit.     Motor: No weakness.  Psychiatric:        Mood and Affect: Mood normal.        Behavior: Behavior normal.     ED Results / Procedures / Treatments   Labs (all labs ordered are listed, but only abnormal results are displayed) Labs Reviewed - No data to display  EKG EKG Interpretation  Date/Time:  Friday July 24 2021 06:47:48 EDT Ventricular Rate:  84 PR Interval:  174 QRS Duration: 76 QT Interval:  374 QTC Calculation: 441 R Axis:   10 Text Interpretation: Normal sinus rhythm Normal ECG Confirmed by Geoffery Lyons ([id number]) on 07/24/2021 6:53:04 AM  Radiology No results found.  Procedures Procedures    Medications Ordered in ED Medications  ibuprofen (ADVIL) tablet 800 mg (800 mg Oral Given 07/24/21 0901)  oxyCODONE-acetaminophen (PERCOCET/ROXICET) 5-325 MG per tablet 1 tablet (1 tablet Oral Given 07/24/21 0901)  dexamethasone (DECADRON) injection 10 mg (10 mg Intramuscular Given 07/24/21 6270)    ED  Course/ Medical Decision Making/ A&P                           Medical Decision Making Risk Prescription drug management.   Patient is here with isolated left shoulder pain, which I am strongly suspicious is musculoskeletal in nature, given the extremely easy reproducibility on exam.  This would be consistent with a bursitis or capsulitis of the left shoulder.  She has no neurovascular deficits in the distal arm.  I doubt critical cervical spine impingement.  This seems less consistent with cervical radiculopathy.  She did have an EKG performed at intake which per my review and interpretation shows a normal sinus rhythm with no acute ischemic findings.  I doubt ACS at this time.  We can treat with a anti-inflammatory and pain medication.  She will follow-up with an orthopedist.  A sling was offered for her comfort for the next day or 2, although she was advised not to overuse the sling moving forward, she does need to maintain some mobility of the shoulder.        Final Clinical Impression(s) / ED Diagnoses Final diagnoses:  Acute pain of left shoulder    Rx / DC Orders ED Discharge Orders          Ordered    ibuprofen (ADVIL) 600 MG tablet  Every 8 hours PRN        07/24/21 0901    oxyCODONE-acetaminophen (PERCOCET/ROXICET) 5-325 MG tablet  Every 6 hours PRN        07/24/21 0901              Terald Sleeper, MD 07/24/21 [phone]

## 2021-07-24 NOTE — ED Triage Notes (Signed)
Woke up with L arm pain.

## 2021-07-28 ENCOUNTER — Ambulatory Visit (HOSPITAL_BASED_OUTPATIENT_CLINIC_OR_DEPARTMENT_OTHER): Payer: 59 | Admitting: Physical Therapy

## 2021-07-28 ENCOUNTER — Encounter (HOSPITAL_BASED_OUTPATIENT_CLINIC_OR_DEPARTMENT_OTHER): Payer: Self-pay | Admitting: Physical Therapy

## 2021-07-28 DIAGNOSIS — M5459 Other low back pain: Secondary | ICD-10-CM

## 2021-07-28 DIAGNOSIS — M6281 Muscle weakness (generalized): Secondary | ICD-10-CM

## 2021-07-28 DIAGNOSIS — R262 Difficulty in walking, not elsewhere classified: Secondary | ICD-10-CM

## 2021-07-28 NOTE — Therapy (Signed)
OUTPATIENT PHYSICAL THERAPY THORACOLUMBAR TREATMENT   Patient Name: MYLIYAH REBUCK MRN: [id number] DOB:1960-02-23, 61 y.o., female Today's Date: 07/28/2021   PT End of Session - 07/28/21 0906     Visit Number 8    Number of Visits 19    Date for PT Re-Evaluation 09/21/21    Authorization Type Aet[employer]    PT Start Time 0900    PT Stop Time 0939    PT Time Calculation (min) 39 min    Activity Tolerance Patient tolerated treatment well    Behavior During Therapy WFL for tasks assessed/performed             Past Medical History:  Diagnosis Date   Back pain    Cervical disc disorder with radiculopathy of cervical region    PONV (postoperative nausea and vomiting)    Sciatica    Vitamin D deficiency    Past Surgical History:  Procedure Laterality Date   ABDOMINAL HYSTERECTOMY     CHOLECYSTECTOMY N/A 06/07/2017   Procedure: LAPAROSCOPIC CHOLECYSTECTOMY;  Surgeon: Jimmye Norman, MD;  Location: [facility] OR;  Service: General;  Laterality: N/A;   FOOT SURGERY     LUMBAR LAMINECTOMY/DECOMPRESSION MICRODISCECTOMY Left 11/10/2016   Procedure: Left L5-S1 discectomy;  Surgeon: Venita Lick, MD;  Location: [facility] OR;  Service: Orthopedics;  Laterality: Left;  120 mins   Patient Active Problem List   Diagnosis Date Noted   Family history of coronary artery disease 12/19/2019   Prediabetes 11/06/2019   Class 1 obesity due to excess calories with body mass index (BMI) of 32.0 to 32.9 in adult 11/05/2019   Bunion 01/19/2019   Pain in right knee 01/02/2019   Cervical radiculopathy 02/07/2018   Chronic pain syndrome 10/03/2017   Closed fracture of second toe of left foot 08/23/2017   DDD (degenerative disc disease), cervical 03/28/2017   Lumbar post-laminectomy syndrome 03/28/2017   Status post lumbar spine surgery for decompression of spinal cord 11/10/2016   Plantar fasciitis 02/03/2016   Left shoulder pain 12/26/2012   Low back pain 09/25/2012   Cervical pain (neck) 09/25/2012    PCP:  Jarrett Soho, PA-C  REFERRING PROVIDER: Venita Lick, MD  REFERRING DIAG: M54.50 (ICD-10-CM) - Low back pain, unspecified  Rationale for Evaluation and Treatment Rehabilitation  THERAPY DIAG:  Other low back pain  Difficulty walking  Muscle weakness (generalized)  ONSET DATE: 05/2021  SUBJECTIVE:                                                                                                                                                                                           SUBJECTIVE STATEMENT:  "Went to  ER on Fri with right shoulder hurting.  Thought it was my heart.  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 no: NPRS scale:0/10 Pain location: lower neck, bilat Pain description: dull , tight Aggravating factors: turning head right Relieving factors: meds   PRECAUTIONS: None  WEIGHT BEARING RESTRICTIONS No  FALLS:  Has patient fallen in last 6 months? No  LIVING ENVIRONMENT: Lives with: lives with their family Lives in: House/apartment Stairs: No Has following equipment at home: None  OCCUPATION: WFH; at a desk   PLOF: Independent  PATIENT GOALS :   OBJECTIVE: * Findings taken at [facility] unless otherwise noted.   DIAGNOSTIC FINDINGS:  N/A  PATIENT SURVEYS:  FOTO 53 63 @ DC 3 pts  SCREENING FOR RED FLAGS: Bowel or bladder incontinence: No Spinal tumors: No Cauda equina syndrome: No Compression fracture: No   COGNITION:  Overall cognitive status: Within functional limits for tasks assessed     SENSATION: Light touch: Impaired   POSTURE: decreased lumbar lordosis  PALPATION: TTP and hypertonicity of bilat L3-5 lumbar paraspinals; bilat QL; bilat SIJ, glutes, and hip rotators  LUMBAR ROM:   Active  A/PROM  eval  Flexion 50% p!  Extension 25% p!  Right lateral flexion 75% p!  Left  lateral flexion 75% p!  Right rotation 75% p!  Left rotation 75% p!   (Blank rows = not tested)  LOWER EXTREMITY ROM:   too sensitive,painful to test following AROM and lumbar testing, 7/10 pain  LOWER EXTREMITY MMT:    MMT Right eval Left eval  Hip flexion 4/5 4/5  Hip extension    Hip abduction 4/5 4/5  Hip adduction Too irritable to test Too irritable to test   (Blank rows = not tested)  LUMBAR SPECIAL TESTS:  Straight leg raise test: Positive, Slump test: Positive, SI Compression/distraction test: Positive, FABER test: Positive, and Trendelenburg sign: Positive  FUNCTIONAL TESTS:  5 times sit to stand: 13.2s   (significant increase in pain)  GAIT: Distance walked: 81ft  Assistive device utilized: None Level of assistance: Complete Independence Comments: Bilat Trendelenburg, decrease lumbar lordosis   TODAY'S TREATMENT  Pt seen for aquatic therapy today.  Treatment took place in water 3.25-4.8 ft in depth at the [facility] pool. Temp of water was 91.  Pt entered/exited the pool via stairs independently with bilat rail.  In 50ft+ depth:   forward / backward walking with yellow noodle, for more relaxed/confident gait Cues for abdominal bracing: ue 1 foam hand buoy RUE, aqua glove left, horizontal add/abd; flex; add/abd IR/ER Side lunge x 4 widths, no UE movement Seated on 4th step STS x 10 without UE Monster walk x 4 widths STS x 8, with cues for controlled descent and ab set High knee march backward/forward without support L/R hamstring stretch with foot on 2nd step  Pt requires buoyancy for support and to offload joints with strengthening exercises. Viscosity of the water is needed for resistance of strengthening; water current perturbations provides challenge to standing balance unsupported, requiring increased core activation.  PATIENT EDUCATION:  Education details: aquatic progression, DOMS expectations  Person educated: Patient Education method:  explanation, demonstration Education comprehension: verbalized understanding, returned demonstration, verbal cues required, and tactile cues required   HOME EXERCISE PROGRAM:  Access Code: [email or web address] URL: [email or web address] Date: 06/23/2021 Prepared by: Zebedee Iba  ASSESSMENT:  CLINICAL IMPRESSION: Pt with immediate onset  of left shoulder pain etiology unknown last Friday. Went to ER.  MI R/o.  Left shoulder pain 7/10.  Has not taken OTC pain relievers this am as prescribed by MD and is limited by pain in todays session. Unable to tolerate engagement of LUE and moving throughout session guarded.  She reports no noticeable back pain which has been progressively improving over last few sessions. She has an evaluation scheduled with an on land therapist this week to address new dx.  It is likely she will be seen on land and in pool to address both dx going forward.   OBJECTIVE IMPAIRMENTS Abnormal gait, decreased activity tolerance, decreased balance, decreased mobility, difficulty walking, decreased ROM, decreased strength, hypomobility, increased muscle spasms, impaired flexibility, impaired sensation, improper body mechanics, postural dysfunction, and pain.   ACTIVITY LIMITATIONS carrying, lifting, bending, sitting, standing, squatting, sleeping, stairs, transfers, and locomotion level  PARTICIPATION LIMITATIONS: cleaning, laundry, interpersonal relationship, driving, shopping, community activity, yard work, and exercise  PERSONAL FACTORS Age, Fitness, Time since onset of injury/illness/exacerbation, and 1-2 comorbidities:    are also affecting patient's functional outcome.   REHAB POTENTIAL: Fair    CLINICAL DECISION MAKING: Evolving/moderate complexity  EVALUATION COMPLEXITY: Moderate   GOALS:  SHORT TERM GOALS: Target date: 08/04/2021  Pt will become independent with HEP in order to demonstrate synthesis of PT education.   Goal status: INITIAL  2.  Pt will  report at least 2 pt reduction on NPRS scale for pain in order to demonstrate functional improvement with household activity, self care, and ADL.   Goal status: Achieved - 07/21/21  3.   Pt will score at least 3 pt increase on FOTO to demonstrate functional improvement in MCII and pt perceived function.    Goal status: INITIAL    LONG TERM GOALS: Target date: 09/15/2021  Pt  will become independent with final HEP in order to demonstrate synthesis of PT education.  Goal status: INITIAL  2.  Pt will score >/= 63 on FOTO to demonstrate improvement in perceived lumbar function.  Goal status: INITIAL  3.  Pt will be able to perform 5XSTS in under 12s  in order to demonstrate functional improvement above the cut off score for adults.   Goal status: INITIAL  4.  Pt will be able to demonstrate/report ability to sit/stand/sleep for extended periods of time without pain in order to demonstrate functional improvement and tolerance to static positioning.    Goal status: INITIAL    PLAN: PT FREQUENCY: 1-2x/week  PT DURATION: 12 weeks (likely DC in 8 wks)  PLANNED INTERVENTIONS: Therapeutic exercises, Therapeutic activity, Neuromuscular re-education, Balance training, Gait training, Patient/Family education, Joint manipulation, Joint mobilization, Stair training, Orthotic/Fit training, DME instructions, Aquatic Therapy, Dry Needling, Electrical stimulation, Spinal manipulation, Spinal mobilization, Cryotherapy, Moist heat, scar mobilization, Splintting, Taping, Vasopneumatic device, Traction, Ultrasound, Ionotophoresis 4mg /ml Dexamethasone, Manual therapy, and Re-evaluation.  PLAN FOR NEXT SESSION: Complete FOTO for LB, continue aquatics, lumbar ROM/flexbility,  lumbopelvic strength, hip stability  (Frankie) Desirre Eickhoff MPT 07/28/21 1:38 PM

## 2021-07-30 ENCOUNTER — Encounter (HOSPITAL_BASED_OUTPATIENT_CLINIC_OR_DEPARTMENT_OTHER): Payer: Self-pay | Admitting: Physical Therapy

## 2021-07-30 ENCOUNTER — Ambulatory Visit (HOSPITAL_BASED_OUTPATIENT_CLINIC_OR_DEPARTMENT_OTHER): Payer: 59 | Admitting: Physical Therapy

## 2021-07-30 DIAGNOSIS — M25612 Stiffness of left shoulder, not elsewhere classified: Secondary | ICD-10-CM

## 2021-07-30 DIAGNOSIS — M6281 Muscle weakness (generalized): Secondary | ICD-10-CM

## 2021-07-30 DIAGNOSIS — R262 Difficulty in walking, not elsewhere classified: Secondary | ICD-10-CM

## 2021-07-30 DIAGNOSIS — M5459 Other low back pain: Secondary | ICD-10-CM

## 2021-07-30 DIAGNOSIS — R293 Abnormal posture: Secondary | ICD-10-CM

## 2021-07-30 DIAGNOSIS — M25512 Pain in left shoulder: Secondary | ICD-10-CM

## 2021-07-30 NOTE — Therapy (Signed)
OUTPATIENT PHYSICAL THERAPY THORACOLUMBAR RE-EVAL/SHOULDER EVAL    Patient Name: Melissa Roth MRN: [id number] DOB:1960-05-14, 61 y.o., female Today's Date: 07/30/2021   PT End of Session - 07/30/21 1201     Visit Number 9    Number of Visits 21    Date for PT Re-Evaluation 09/10/21    Authorization Type Aetna    Authorization Time Period 07/30/21 to 09/10/21    PT Start Time 1101    PT Stop Time 1144    PT Time Calculation (min) 43 min    Activity Tolerance Patient tolerated treatment well    Behavior During Therapy WFL for tasks assessed/performed              Past Medical History:  Diagnosis Date   Back pain    Cervical disc disorder with radiculopathy of cervical region    PONV (postoperative nausea and vomiting)    Sciatica    Vitamin D deficiency    Past Surgical History:  Procedure Laterality Date   ABDOMINAL HYSTERECTOMY     CHOLECYSTECTOMY N/A 06/07/2017   Procedure: LAPAROSCOPIC CHOLECYSTECTOMY;  Surgeon: Jimmye Norman, MD;  Location: [facility] OR;  Service: General;  Laterality: N/A;   FOOT SURGERY     LUMBAR LAMINECTOMY/DECOMPRESSION MICRODISCECTOMY Left 11/10/2016   Procedure: Left L5-S1 discectomy;  Surgeon: Venita Lick, MD;  Location: [facility] OR;  Service: Orthopedics;  Laterality: Left;  120 mins   Patient Active Problem List   Diagnosis Date Noted   Family history of coronary artery disease 12/19/2019   Prediabetes 11/06/2019   Class 1 obesity due to excess calories with body mass index (BMI) of 32.0 to 32.9 in adult 11/05/2019   Bunion 01/19/2019   Pain in right knee 01/02/2019   Cervical radiculopathy 02/07/2018   Chronic pain syndrome 10/03/2017   Closed fracture of second toe of left foot 08/23/2017   DDD (degenerative disc disease), cervical 03/28/2017   Lumbar post-laminectomy syndrome 03/28/2017   Status post lumbar spine surgery for decompression of spinal cord 11/10/2016   Plantar fasciitis 02/03/2016   Left shoulder pain 12/26/2012   Low back  pain 09/25/2012   Cervical pain (neck) 09/25/2012    PCP: Jarrett Soho, PA-C  REFERRING PROVIDER: Venita Lick, MD  REFERRING DIAG: M54.50 (ICD-10-CM) - Low back pain, unspecified  Rationale for Evaluation and Treatment Rehabilitation  THERAPY DIAG:  Other low back pain - Plan: PT plan of care cert/re-cert, PT plan of care cert/re-cert  Difficulty walking - Plan: PT plan of care cert/re-cert, PT plan of care cert/re-cert  Muscle weakness (generalized) - Plan: PT plan of care cert/re-cert, PT plan of care cert/re-cert  Stiffness of left shoulder, not elsewhere classified - Plan: PT plan of care cert/re-cert, PT plan of care cert/re-cert  Abnormal posture - Plan: PT plan of care cert/re-cert, PT plan of care cert/re-cert  Acute pain of left shoulder - Plan: PT plan of care cert/re-cert, PT plan of care cert/re-cert  ONSET DATE: 05/2021  SUBJECTIVE:  SUBJECTIVE STATEMENT:  I ended up in [facility] ER on Friday due to L UE pain, EKG was OK and 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 I saw a doctor over in [place] and they did a cortisone injection yesterday. I can move the shoulder a little more after the injection, its less painful. Doctor told me OK to move shoulder around in therapy this soon after injection.   PERTINENT HISTORY:   2018 Lumbar laminectomy/decompression microdiscectomy (Left) Pre-diabetic C/S radic   PAIN:  Are you having pain? yes: NPRS scale:6/10 Pain location: left shoulder  Pain description: dull, sore  Aggravating factors: using it  Relieving factors: injection, tylenol    PRECAUTIONS: None  WEIGHT BEARING RESTRICTIONS No  FALLS:  Has patient fallen in last 6 months? No  LIVING ENVIRONMENT: Lives with: lives with their family Lives in:  House/apartment Stairs: No Has following equipment at home: None  OCCUPATION: [employer]; at a desk   PLOF: Independent  PATIENT GOALS :   OBJECTIVE: * Findings taken at [facility] unless otherwise noted.   DIAGNOSTIC FINDINGS:  N/A  PATIENT SURVEYS:  FOTO 53.7 back, L shoulder 47.4  63 @ DC 3 pts  SCREENING FOR RED FLAGS: Bowel or bladder incontinence: No Spinal tumors: No Cauda equina syndrome: No Compression fracture: No   COGNITION:  Overall cognitive status: Within functional limits for tasks assessed     SENSATION: Light touch: Impaired   POSTURE: decreased lumbar lordosis  PALPATION: TTP and hypertonicity of bilat L3-5 lumbar paraspinals; bilat QL; bilat SIJ, glutes, and hip rotators; L deltoid in spasm all groups and very TTP, proximal biceps TTP  LUMBAR ROM:   Active  A/PROM  eval 7/27  Flexion 50% p! 25% limited   Extension 25% p! 25% limited   Right lateral flexion 75% p! 25% limited   Left lateral flexion 75% p! 25% limited   Right rotation 75% p! WNL   Left rotation 75% p! WNL    (Blank rows = not tested)  LOWER EXTREMITY ROM:   too sensitive,painful to test following AROM and lumbar testing, 7/10 pain  SHOULDER ROM  Flexion AROM: R 170, L 82 and painful ABD AROM: R full ROM, L 77 and painful  IR PROM L UE:62 ER PROM L UE: 70   LOWER EXTREMITY MMT:    MMT Right eval Left eval  Hip flexion 4/5 4/5  Hip extension    Hip abduction 4/5 4/5  Hip adduction Too irritable to test Too irritable to test   (Blank rows = not tested)  SHOULDER MMT:  Flexion: R 4+/5, L 4/5 in available ROM but painful ABD: R 4+/5, L 3+/5 in available ROM Extension:R 4+/5 L 3/5 IR: R 4+/5 L 4-/5 ER: R 4+/5 L 3+/5  LUMBAR SPECIAL TESTS:  Straight leg raise test: Positive, Slump test: Positive, SI Compression/distraction test: Positive, FABER test: Positive, and Trendelenburg sign: Positive  FUNCTIONAL TESTS:  5 times sit to stand: 13.2s   (significant increase in  pain)  GAIT: Distance walked: 56ft  Assistive device utilized: None Level of assistance: Complete Independence Comments: Bilat Trendelenburg, decrease lumbar lordosis   TODAY'S TREATMENT  Pt seen for aquatic therapy today.  Treatment took place in water 3.25-4.8 ft in depth at the [facility] pool. Temp of water was 91.  Pt entered/exited the pool via stairs independently with bilat rail.  In 51ft+ depth:   forward / backward walking with yellow noodle, for more relaxed/confident gait Cues for abdominal bracing: ue 1  foam hand buoy RUE, aqua glove left, horizontal add/abd; flex; add/abd IR/ER Side lunge x 4 widths, no UE movement Seated on 4th step STS x 10 without UE Monster walk x 4 widths STS x 8, with cues for controlled descent and ab set High knee march backward/forward without support L/R hamstring stretch with foot on 2nd step  Pt requires buoyancy for support and to offload joints with strengthening exercises. Viscosity of the water is needed for resistance of strengthening; water current perturbations provides challenge to standing balance unsupported, requiring increased core activation.  PATIENT EDUCATION:  Education details: POC moving forward, lumbar and shoulder findings, shoulder HEP   Person educated: Patient Education method: explanation, demonstration Education comprehension: verbalized understanding, returned demonstration, verbal cues required, and tactile cues required   HOME EXERCISE PROGRAM:  Access Code: [email or web address] URL: [email or web address] Date: 06/23/2021 Prepared by: Daleen Bo  ASSESSMENT:  CLINICAL IMPRESSION: Completed lumbar re-eval and shoulder initial eval this session, also updated goals. Provided education on POC moving forward as well as expectations for shoulder given possible frozen shoulder. Will plan for continuation of therapies in the pool before transition to land.    OBJECTIVE IMPAIRMENTS Abnormal gait,  decreased activity tolerance, decreased balance, decreased mobility, difficulty walking, decreased ROM, decreased strength, hypomobility, increased muscle spasms, impaired flexibility, impaired sensation, improper body mechanics, postural dysfunction, and pain.   ACTIVITY LIMITATIONS carrying, lifting, bending, sitting, standing, squatting, sleeping, stairs, transfers, and locomotion level  PARTICIPATION LIMITATIONS: cleaning, laundry, interpersonal relationship, driving, shopping, community activity, yard work, and exercise  PERSONAL FACTORS Age, Fitness, Time since onset of injury/illness/exacerbation, and 1-2 comorbidities:    are also affecting patient's functional outcome.   REHAB POTENTIAL: Fair    CLINICAL DECISION MAKING: Evolving/moderate complexity  EVALUATION COMPLEXITY: Moderate   GOALS:  SHORT TERM GOALS: Target date: 08/20/2021  Pt will become independent with HEP in order to demonstrate synthesis of PT education.   Goal status: IN PROGRESS "so so"   2.  Pt will report at least 2 pt reduction on NPRS scale for pain in order to demonstrate functional improvement with household activity, self care, and ADL.   Goal status: Achieved - 07/21/21  3.   Pt will score at least 3 pt increase on FOTO to demonstrate functional improvement in MCII and pt perceived function.    Goal status: INITIAL  4.  Pain in L shoulder to be no more than 3/10 at worst  Baseline:  Goal status: INITIAL  5..  Will have better understanding of posture and ergonomics  Baseline:  Goal status: INITIAL      LONG TERM GOALS: Target date: 09/10/2021  Pt  will become independent with final HEP in order to demonstrate synthesis of PT education.  Goal status: INITIAL  2.  Pt will score >/= 63 on FOTO to demonstrate improvement in perceived lumbar function.  Goal status: IN PROGRESS 53.7  3.  Pt will be able to perform 5XSTS in under 12s  in order to demonstrate functional improvement above the  cut off score for adults.   Goal status: IN PROGRESS12 seconds exactly   4.  Pt will be able to demonstrate/report ability to sit/stand/sleep for extended periods of time without pain in order to demonstrate functional improvement and tolerance to static positioning.    Goal status: IN PROGRESS have been getting good sleep since last Friday but had problems with sleep prior to this   5.  AROM in L shoulder will be within 10 degrees of R shoulder  in all directions  Baseline:  Goal status: INITIAL  6.  Will be able to use L UE for dressing/ADLs/hygiene without increase in pain  Baseline:  Goal status: INITIAL  7.  Will be able to reach overhead to turn a fan on/off or put a light object on a shelf overhead with LUE without increase in pain  Baseline:  Goal status: INITIAL       PLAN: PT FREQUENCY: 2x/week  PT DURATION: 6 weeks (likely DC in 8 wks)  PLANNED INTERVENTIONS: Therapeutic exercises, Therapeutic activity, Neuromuscular re-education, Balance training, Gait training, Patient/Family education, Joint manipulation, Joint mobilization, Stair training, Orthotic/Fit training, DME instructions, Aquatic Therapy, Dry Needling, Electrical stimulation, Spinal manipulation, Spinal mobilization, Cryotherapy, Moist heat, scar mobilization, Splintting, Taping, Vasopneumatic device, Traction, Ultrasound, Ionotophoresis 4mg /ml Dexamethasone, Manual therapy, and Re-evaluation.  PLAN FOR NEXT SESSION:  continue aquatics, lumbar ROM/flexbility,  lumbopelvic strength, hip stability; shoulder ROM and strength as able and tolerated    Macrina Lehnert U PT DPT PN2  07/30/2021, 12:08 PM

## 2021-08-04 ENCOUNTER — Ambulatory Visit (HOSPITAL_BASED_OUTPATIENT_CLINIC_OR_DEPARTMENT_OTHER): Payer: 59 | Attending: Orthopedic Surgery | Admitting: Physical Therapy

## 2021-08-04 ENCOUNTER — Encounter (HOSPITAL_BASED_OUTPATIENT_CLINIC_OR_DEPARTMENT_OTHER): Payer: Self-pay | Admitting: Physical Therapy

## 2021-08-04 DIAGNOSIS — M5459 Other low back pain: Secondary | ICD-10-CM | POA: Insufficient documentation

## 2021-08-04 DIAGNOSIS — R293 Abnormal posture: Secondary | ICD-10-CM | POA: Insufficient documentation

## 2021-08-04 DIAGNOSIS — M6281 Muscle weakness (generalized): Secondary | ICD-10-CM | POA: Insufficient documentation

## 2021-08-04 DIAGNOSIS — M25512 Pain in left shoulder: Secondary | ICD-10-CM | POA: Insufficient documentation

## 2021-08-04 DIAGNOSIS — R262 Difficulty in walking, not elsewhere classified: Secondary | ICD-10-CM | POA: Diagnosis present

## 2021-08-04 DIAGNOSIS — M25612 Stiffness of left shoulder, not elsewhere classified: Secondary | ICD-10-CM | POA: Diagnosis present

## 2021-08-04 NOTE — Therapy (Signed)
OUTPATIENT PHYSICAL THERAPY THORACOLUMBAR RE-EVAL/SHOULDER EVAL    Patient Name: MAXIMA SKELTON MRN: [id number] DOB:[birth year], 61 y.o., female Today's Date: 08/04/2021   PT End of Session - 08/04/21 1036     Visit Number 10    Number of Visits 21    Date for PT Re-Evaluation 09/10/21    Authorization Type [employer]    Authorization Time Period 07/30/21 to 09/10/21    PT Start Time 1031    PT Stop Time 1115    PT Time Calculation (min) 44 min    Activity Tolerance Patient tolerated treatment well    Behavior During Therapy WFL for tasks assessed/performed              Past Medical History:  Diagnosis Date   Back pain    Cervical disc disorder with radiculopathy of cervical region    PONV (postoperative nausea and vomiting)    Sciatica    Vitamin D deficiency    Past Surgical History:  Procedure Laterality Date   ABDOMINAL HYSTERECTOMY     CHOLECYSTECTOMY N/A 06/07/2017   Procedure: LAPAROSCOPIC CHOLECYSTECTOMY;  Surgeon: Jimmye Norman, MD;  Location: [facility] OR;  Service: General;  Laterality: N/A;   FOOT SURGERY     LUMBAR LAMINECTOMY/DECOMPRESSION MICRODISCECTOMY Left 11/10/2016   Procedure: Left L5-S1 discectomy;  Surgeon: Venita Lick, MD;  Location: [facility] OR;  Service: Orthopedics;  Laterality: Left;  120 mins   Patient Active Problem List   Diagnosis Date Noted   Family history of coronary artery disease 12/19/2019   Prediabetes 11/06/2019   Class 1 obesity due to excess calories with body mass index (BMI) of 32.0 to 32.9 in adult 11/05/2019   Bunion 01/19/2019   Pain in right knee 01/02/2019   Cervical radiculopathy 02/07/2018   Chronic pain syndrome 10/03/2017   Closed fracture of second toe of left foot 08/23/2017   DDD (degenerative disc disease), cervical 03/28/2017   Lumbar post-laminectomy syndrome 03/28/2017   Status post lumbar spine surgery for decompression of spinal cord 11/10/2016   Plantar fasciitis 02/03/2016   Left shoulder pain 12/26/2012   Low back  pain 09/25/2012   Cervical pain (neck) 09/25/2012    PCP: Jarrett Soho, PA-C  REFERRING PROVIDER: Venita Lick, MD  REFERRING DIAG: M54.50 (ICD-10-CM) - Low back pain, unspecified  Rationale for Evaluation and Treatment Rehabilitation  THERAPY DIAG:  Other low back pain  Difficulty walking  Muscle weakness (generalized)  ONSET DATE: 05/2021  SUBJECTIVE:  SUBJECTIVE STATEMENT: "Injection helped, still hurting (shoulder)  LB hurts a little 1-2/10, seems all is focused in shoulder"  PERTINENT HISTORY:   2018 Lumbar laminectomy/decompression microdiscectomy (Left) Pre-diabetic C/S radic   PAIN:  Are you having pain? yes: NPRS scale:8/10 Pain location: left shoulder  Pain description: dull, sore  Aggravating factors: using it  Relieving factors: injection, tylenol  LB 1-2/10   PRECAUTIONS: None  WEIGHT BEARING RESTRICTIONS No  FALLS:  Has patient fallen in last 6 months? No  LIVING ENVIRONMENT: Lives with: lives with their family Lives in: House/apartment Stairs: No Has following equipment at home: None  OCCUPATION: WFH; at a desk   PLOF: Independent  PATIENT GOALS :   OBJECTIVE: * Findings taken at [facility] unless otherwise noted.   DIAGNOSTIC FINDINGS:  N/A  PATIENT SURVEYS:  FOTO 53.7 back, L shoulder 47.4  63 @ DC 3 pts  SCREENING FOR RED FLAGS: Bowel or bladder incontinence: No Spinal tumors: No Cauda equina syndrome: No Compression fracture: No   COGNITION:  Overall cognitive status: Within functional limits for tasks assessed     SENSATION: Light touch: Impaired   POSTURE: decreased lumbar lordosis  PALPATION: TTP and hypertonicity of bilat L3-5 lumbar paraspinals; bilat QL; bilat SIJ, glutes, and hip rotators; L deltoid in spasm all groups and  very TTP, proximal biceps TTP  LUMBAR ROM:   Active  A/PROM  eval 7/27  Flexion 50% p! 25% limited   Extension 25% p! 25% limited   Right lateral flexion 75% p! 25% limited   Left lateral flexion 75% p! 25% limited   Right rotation 75% p! WNL   Left rotation 75% p! WNL    (Blank rows = not tested)  LOWER EXTREMITY ROM:   too sensitive,painful to test following AROM and lumbar testing, 7/10 pain  SHOULDER ROM  Flexion AROM: R 170, L 82 and painful ABD AROM: R full ROM, L 77 and painful  IR PROM L UE:62 ER PROM L UE: 70   LOWER EXTREMITY MMT:    MMT Right eval Left eval  Hip flexion 4/5 4/5  Hip extension    Hip abduction 4/5 4/5  Hip adduction Too irritable to test Too irritable to test   (Blank rows = not tested)  SHOULDER MMT:  Flexion: R 4+/5, L 4/5 in available ROM but painful ABD: R 4+/5, L 3+/5 in available ROM Extension:R 4+/5 L 3/5 IR: R 4+/5 L 4-/5 ER: R 4+/5 L 3+/5  LUMBAR SPECIAL TESTS:  Straight leg raise test: Positive, Slump test: Positive, SI Compression/distraction test: Positive, FABER test: Positive, and Trendelenburg sign: Positive  FUNCTIONAL TESTS:  5 times sit to stand: 13.2s   (significant increase in pain)  GAIT: Distance walked: 86ft  Assistive device utilized: None Level of assistance: Complete Independence Comments: Bilat Trendelenburg, decrease lumbar lordosis   TODAY'S TREATMENT  Pt seen for aquatic therapy today.  Treatment took place in water 3.25-4.8 ft in depth at the [facility] pool. Temp of water was 91.  Pt entered/exited the pool via stairs independently with bilat rail.  In 24ft+ depth:   forward / backward walking with yellow noodle, for more relaxed/confident gait UE/shoulder horizontal 1 foam hand buoy right; re hand paddle left add/abd; flex/ext (90d to0); add/abd; shrugs; scap retraction; shoulder circles 2 x 10 CW/CCW; IR/ER; shoulder extension (As tolerated LUE) x10 Side lunge x 4 widths, ue  add/abd Seated on 4th step STS x 10 without UE. Cues for abdominal bracing Adductor sets 5  x 10s hold High knee march backward/forward without support with arm swing  Pt requires buoyancy for support and to offload joints with strengthening exercises. Viscosity of the water is needed for resistance of strengthening; water current perturbations provides challenge to standing balance unsupported, requiring increased core activation.  PATIENT EDUCATION:  Education details: POC moving forward, lumbar and shoulder findings, shoulder HEP   Person educated: Patient Education method: explanation, demonstration Education comprehension: verbalized understanding, returned demonstration, verbal cues required, and tactile cues required   HOME EXERCISE PROGRAM:  Access Code: [email or web address] URL: [email or web address] Date: 06/23/2021 Prepared by: Daleen Bo  ASSESSMENT:  CLINICAL IMPRESSION: Focus on left shoulder. Added ue exercises encouraging increased ROM. She tolerates fair working through pain as able (at bicep insertion). ER left shoulder progressively improve with repetition of exercises. Moderate VC and demonstration for proper execution without substitution. Ended in [place] (unbilled) completing shoulder ROM with water massage as tolerated.     OBJECTIVE IMPAIRMENTS Abnormal gait, decreased activity tolerance, decreased balance, decreased mobility, difficulty walking, decreased ROM, decreased strength, hypomobility, increased muscle spasms, impaired flexibility, impaired sensation, improper body mechanics, postural dysfunction, and pain.   ACTIVITY LIMITATIONS carrying, lifting, bending, sitting, standing, squatting, sleeping, stairs, transfers, and locomotion level  PARTICIPATION LIMITATIONS: cleaning, laundry, interpersonal relationship, driving, shopping, community activity, yard work, and exercise  PERSONAL FACTORS Age, Fitness, Time since onset of  injury/illness/exacerbation, and 1-2 comorbidities:    are also affecting patient's functional outcome.   REHAB POTENTIAL: Fair    CLINICAL DECISION MAKING: Evolving/moderate complexity  EVALUATION COMPLEXITY: Moderate   GOALS:  SHORT TERM GOALS: Target date: 08/20/2021  Pt will become independent with HEP in order to demonstrate synthesis of PT education.   Goal status: IN PROGRESS "so so"   2.  Pt will report at least 2 pt reduction on NPRS scale for pain in order to demonstrate functional improvement with household activity, self care, and ADL.   Goal status: Achieved - 07/21/21  3.   Pt will score at least 3 pt increase on FOTO to demonstrate functional improvement in MCII and pt perceived function.    Goal status: INITIAL  4.  Pain in L shoulder to be no more than 3/10 at worst  Baseline:  Goal status: INITIAL  5..  Will have better understanding of posture and ergonomics  Baseline:  Goal status: INITIAL      LONG TERM GOALS: Target date: 09/10/2021  Pt  will become independent with final HEP in order to demonstrate synthesis of PT education.  Goal status: INITIAL  2.  Pt will score >/= 63 on FOTO to demonstrate improvement in perceived lumbar function.  Goal status: IN PROGRESS 53.7  3.  Pt will be able to perform 5XSTS in under 12s  in order to demonstrate functional improvement above the cut off score for adults.   Goal status: IN PROGRESS12 seconds exactly   4.  Pt will be able to demonstrate/report ability to sit/stand/sleep for extended periods of time without pain in order to demonstrate functional improvement and tolerance to static positioning.    Goal status: IN PROGRESS have been getting good sleep since last Friday but had problems with sleep prior to this   5.  AROM in L shoulder will be within 10 degrees of R shoulder in all directions  Baseline:  Goal status: INITIAL  6.  Will be able to use L UE for dressing/ADLs/hygiene without increase in  pain  Baseline:  Goal status: INITIAL  7.  Will be  able to reach overhead to turn a fan on/off or put a light object on a shelf overhead with LUE without increase in pain  Baseline:  Goal status: INITIAL       PLAN: PT FREQUENCY: 2x/week  PT DURATION: 6 weeks (likely DC in 8 wks)  PLANNED INTERVENTIONS: Therapeutic exercises, Therapeutic activity, Neuromuscular re-education, Balance training, Gait training, Patient/Family education, Joint manipulation, Joint mobilization, Stair training, Orthotic/Fit training, DME instructions, Aquatic Therapy, Dry Needling, Electrical stimulation, Spinal manipulation, Spinal mobilization, Cryotherapy, Moist heat, scar mobilization, Splintting, Taping, Vasopneumatic device, Traction, Ultrasound, Ionotophoresis 4mg /ml Dexamethasone, Manual therapy, and Re-evaluation.  PLAN FOR NEXT SESSION:  continue aquatics, lumbar ROM/flexbility,  lumbopelvic strength, hip stability; shoulder ROM and strength as able and tolerated    Stanton Kidney Tharon Aquas) Shawnay Bramel MPT 08/04/2021, 10:41 AM

## 2021-08-06 ENCOUNTER — Ambulatory Visit (HOSPITAL_BASED_OUTPATIENT_CLINIC_OR_DEPARTMENT_OTHER): Payer: 59 | Admitting: Physical Therapy

## 2021-08-06 ENCOUNTER — Encounter (HOSPITAL_BASED_OUTPATIENT_CLINIC_OR_DEPARTMENT_OTHER): Payer: Self-pay | Admitting: Physical Therapy

## 2021-08-06 DIAGNOSIS — R262 Difficulty in walking, not elsewhere classified: Secondary | ICD-10-CM

## 2021-08-06 DIAGNOSIS — M25612 Stiffness of left shoulder, not elsewhere classified: Secondary | ICD-10-CM

## 2021-08-06 DIAGNOSIS — M5459 Other low back pain: Secondary | ICD-10-CM

## 2021-08-06 DIAGNOSIS — M6281 Muscle weakness (generalized): Secondary | ICD-10-CM

## 2021-08-06 NOTE — Therapy (Signed)
OUTPATIENT PHYSICAL THERAPY THORACOLUMBAR     Patient Name: Melissa Roth MRN: [id number] DOB:02/13/1960, 61 y.o., female Today's Date: 08/06/2021   PT End of Session - 08/06/21 1034     Visit Number 11    Number of Visits 21    Date for PT Re-Evaluation 09/10/21    Authorization Type [employer]    Authorization Time Period 07/30/21 to 09/10/21    PT Start Time 1031    PT Stop Time 1115    PT Time Calculation (min) 44 min    Activity Tolerance Patient tolerated treatment well    Behavior During Therapy WFL for tasks assessed/performed               Past Medical History:  Diagnosis Date   Back pain    Cervical disc disorder with radiculopathy of cervical region    PONV (postoperative nausea and vomiting)    Sciatica    Vitamin D deficiency    Past Surgical History:  Procedure Laterality Date   ABDOMINAL HYSTERECTOMY     CHOLECYSTECTOMY N/A 06/07/2017   Procedure: LAPAROSCOPIC CHOLECYSTECTOMY;  Surgeon: Jimmye Norman, MD;  Location: [facility] OR;  Service: General;  Laterality: N/A;   FOOT SURGERY     LUMBAR LAMINECTOMY/DECOMPRESSION MICRODISCECTOMY Left 11/10/2016   Procedure: Left L5-S1 discectomy;  Surgeon: Venita Lick, MD;  Location: [facility] OR;  Service: Orthopedics;  Laterality: Left;  120 mins   Patient Active Problem List   Diagnosis Date Noted   Family history of coronary artery disease 12/19/2019   Prediabetes 11/06/2019   Class 1 obesity due to excess calories with body mass index (BMI) of 32.0 to 32.9 in adult 11/05/2019   Bunion 01/19/2019   Pain in right knee 01/02/2019   Cervical radiculopathy 02/07/2018   Chronic pain syndrome 10/03/2017   Closed fracture of second toe of left foot 08/23/2017   DDD (degenerative disc disease), cervical 03/28/2017   Lumbar post-laminectomy syndrome 03/28/2017   Status post lumbar spine surgery for decompression of spinal cord 11/10/2016   Plantar fasciitis 02/03/2016   Left shoulder pain 12/26/2012   Low back pain 09/25/2012    Cervical pain (neck) 09/25/2012    PCP: Jarrett Soho, PA-C  REFERRING PROVIDER: Venita Lick, MD  REFERRING DIAG: M54.50 (ICD-10-CM) - Low back pain, unspecified  Rationale for Evaluation and Treatment Rehabilitation  THERAPY DIAG:  No diagnosis found.  ONSET DATE: 05/2021  SUBJECTIVE:                                                                                                                                                                                           SUBJECTIVE  STATEMENT: "Shoulder was achy after last session, overall pain higher today maybe because of weather"  PERTINENT HISTORY:   2018 Lumbar laminectomy/decompression microdiscectomy (Left) Pre-diabetic C/S radic   PAIN:  Are you having pain? yes: NPRS scale:8/10 Pain location: left shoulder  Pain description: dull, sore  Aggravating factors: using it  Relieving factors: injection, tylenol  LB 1-2/10   PRECAUTIONS: None  WEIGHT BEARING RESTRICTIONS No  FALLS:  Has patient fallen in last 6 months? No  LIVING ENVIRONMENT: Lives with: lives with their family Lives in: House/apartment Stairs: No Has following equipment at home: None  OCCUPATION: WFH; at a desk   PLOF: Independent  PATIENT GOALS :   OBJECTIVE: * Findings taken at [facility] unless otherwise noted.   DIAGNOSTIC FINDINGS:  N/A  PATIENT SURVEYS:  FOTO 53.7 back, L shoulder 47.4  63 @ DC 3 pts  SCREENING FOR RED FLAGS: Bowel or bladder incontinence: No Spinal tumors: No Cauda equina syndrome: No Compression fracture: No   COGNITION:  Overall cognitive status: Within functional limits for tasks assessed     SENSATION: Light touch: Impaired   POSTURE: decreased lumbar lordosis  PALPATION: TTP and hypertonicity of bilat L3-5 lumbar paraspinals; bilat QL; bilat SIJ, glutes, and hip rotators; L deltoid in spasm all groups and very TTP, proximal biceps TTP  LUMBAR ROM:   Active  A/PROM  eval 7/27  Flexion  50% p! 25% limited   Extension 25% p! 25% limited   Right lateral flexion 75% p! 25% limited   Left lateral flexion 75% p! 25% limited   Right rotation 75% p! WNL   Left rotation 75% p! WNL    (Blank rows = not tested)  LOWER EXTREMITY ROM:   too sensitive,painful to test following AROM and lumbar testing, 7/10 pain  SHOULDER ROM  Flexion AROM: R 170, L 82 and painful ABD AROM: R full ROM, L 77 and painful  IR PROM L UE:62 ER PROM L UE: 70   LOWER EXTREMITY MMT:    MMT Right eval Left eval  Hip flexion 4/5 4/5  Hip extension    Hip abduction 4/5 4/5  Hip adduction Too irritable to test Too irritable to test   (Blank rows = not tested)  SHOULDER MMT:  Flexion: R 4+/5, L 4/5 in available ROM but painful ABD: R 4+/5, L 3+/5 in available ROM Extension:R 4+/5 L 3/5 IR: R 4+/5 L 4-/5 ER: R 4+/5 L 3+/5  LUMBAR SPECIAL TESTS:  Straight leg raise test: Positive, Slump test: Positive, SI Compression/distraction test: Positive, FABER test: Positive, and Trendelenburg sign: Positive  FUNCTIONAL TESTS:  5 times sit to stand: 13.2s   (significant increase in pain)  GAIT: Distance walked: 54ft  Assistive device utilized: None Level of assistance: Complete Independence Comments: Bilat Trendelenburg, decrease lumbar lordosis   TODAY'S TREATMENT  Pt seen for aquatic therapy today.  Treatment took place in water 3.25-4.8 ft in depth at the [facility] pool. Temp of water was 91.  Pt entered/exited the pool via stairs independently with bilat rail.  In 73ft+ depth:   forward / backward walking with yellow noodle, for more relaxed/confident gait UE/shoulder unresisted; add/abd; flex/ext (90d to0); add/abd; shrugs; scap retraction; shoulder circles 2 x 10 CW/CCW; IR/ER; shoulder extension x10 Side lunge x 4 widths, ue add/abd x 4 widths Seated on 4th step STS x 10 without UE. Cues for abdominal bracing Adductor sets 5 x 10s hold High knee march backward/forward without  support with arm swing Plank  3rd step with hip extension x10-. Cues for abd bracing and glute squeeze end extension; squats Standing hip extension; squats x 10.  Cues for execution.  Pt requires buoyancy for support and to offload joints with strengthening exercises. Viscosity of the water is needed for resistance of strengthening; water current perturbations provides challenge to standing balance unsupported, requiring increased core activation.  PATIENT EDUCATION:  Education details: POC moving forward, lumbar and shoulder findings, shoulder HEP   Person educated: Patient Education method: explanation, demonstration Education comprehension: verbalized understanding, returned demonstration, verbal cues required, and tactile cues required   HOME EXERCISE PROGRAM:  Access Code: [email or web address] URL: [email or web address] Date: 06/23/2021 Prepared by: Zebedee Iba  ASSESSMENT:  CLINICAL IMPRESSION: Increase pain sensitivity L shoulder after last session. Taking OTC reduced.  Modified exercises completed without Lue resistance. Moderate VC for decreased UE/shoulder guarding. Also for execution of LB stretching/engagement. She demonstrates improvement with shoulder mobility unloaded. Ended in [place] (unbilled) completing shoulder ROM with water massage as tolerated.     OBJECTIVE IMPAIRMENTS Abnormal gait, decreased activity tolerance, decreased balance, decreased mobility, difficulty walking, decreased ROM, decreased strength, hypomobility, increased muscle spasms, impaired flexibility, impaired sensation, improper body mechanics, postural dysfunction, and pain.   ACTIVITY LIMITATIONS carrying, lifting, bending, sitting, standing, squatting, sleeping, stairs, transfers, and locomotion level  PARTICIPATION LIMITATIONS: cleaning, laundry, interpersonal relationship, driving, shopping, community activity, yard work, and exercise  PERSONAL FACTORS Age, Fitness, Time since onset of  injury/illness/exacerbation, and 1-2 comorbidities:    are also affecting patient's functional outcome.   REHAB POTENTIAL: Fair    CLINICAL DECISION MAKING: Evolving/moderate complexity  EVALUATION COMPLEXITY: Moderate   GOALS:  SHORT TERM GOALS: Target date: 08/20/2021  Pt will become independent with HEP in order to demonstrate synthesis of PT education.   Goal status: IN PROGRESS "so so"   2.  Pt will report at least 2 pt reduction on NPRS scale for pain in order to demonstrate functional improvement with household activity, self care, and ADL.   Goal status: Achieved - 07/21/21  3.   Pt will score at least 3 pt increase on FOTO to demonstrate functional improvement in MCII and pt perceived function.    Goal status: INITIAL  4.  Pain in L shoulder to be no more than 3/10 at worst  Baseline:  Goal status: INITIAL  5..  Will have better understanding of posture and ergonomics  Baseline:  Goal status: INITIAL      LONG TERM GOALS: Target date: 09/10/2021  Pt  will become independent with final HEP in order to demonstrate synthesis of PT education.  Goal status: INITIAL  2.  Pt will score >/= 63 on FOTO to demonstrate improvement in perceived lumbar function.  Goal status: IN PROGRESS 53.7  3.  Pt will be able to perform 5XSTS in under 12s  in order to demonstrate functional improvement above the cut off score for adults.   Goal status: IN PROGRESS12 seconds exactly   4.  Pt will be able to demonstrate/report ability to sit/stand/sleep for extended periods of time without pain in order to demonstrate functional improvement and tolerance to static positioning.    Goal status: IN PROGRESS have been getting good sleep since last Friday but had problems with sleep prior to this   5.  AROM in L shoulder will be within 10 degrees of R shoulder in all directions  Baseline:  Goal status: INITIAL  6.  Will be able to use L UE for dressing/ADLs/hygiene without increase in  pain  Baseline:  Goal status: INITIAL  7.  Will be able to reach overhead to turn a fan on/off or put a light object on a shelf overhead with LUE without increase in pain  Baseline:  Goal status: INITIAL       PLAN: PT FREQUENCY: 2x/week  PT DURATION: 6 weeks (likely DC in 8 wks)  PLANNED INTERVENTIONS: Therapeutic exercises, Therapeutic activity, Neuromuscular re-education, Balance training, Gait training, Patient/Family education, Joint manipulation, Joint mobilization, Stair training, Orthotic/Fit training, DME instructions, Aquatic Therapy, Dry Needling, Electrical stimulation, Spinal manipulation, Spinal mobilization, Cryotherapy, Moist heat, scar mobilization, Splintting, Taping, Vasopneumatic device, Traction, Ultrasound, Ionotophoresis 4mg /ml Dexamethasone, Manual therapy, and Re-evaluation.  PLAN FOR NEXT SESSION:  continue aquatics, lumbar ROM/flexbility,  lumbopelvic strength, hip stability; shoulder ROM and strength as able and tolerated    Corrie Dandy) Cathe Bilger MPT 08/06/2021, 5:31 PM

## 2021-08-11 ENCOUNTER — Ambulatory Visit (HOSPITAL_BASED_OUTPATIENT_CLINIC_OR_DEPARTMENT_OTHER): Payer: 59 | Admitting: Physical Therapy

## 2021-08-11 ENCOUNTER — Encounter (HOSPITAL_BASED_OUTPATIENT_CLINIC_OR_DEPARTMENT_OTHER): Payer: Self-pay | Admitting: Physical Therapy

## 2021-08-11 DIAGNOSIS — R293 Abnormal posture: Secondary | ICD-10-CM

## 2021-08-11 DIAGNOSIS — R262 Difficulty in walking, not elsewhere classified: Secondary | ICD-10-CM

## 2021-08-11 DIAGNOSIS — M25612 Stiffness of left shoulder, not elsewhere classified: Secondary | ICD-10-CM

## 2021-08-11 DIAGNOSIS — M5459 Other low back pain: Secondary | ICD-10-CM | POA: Diagnosis not present

## 2021-08-11 DIAGNOSIS — M6281 Muscle weakness (generalized): Secondary | ICD-10-CM

## 2021-08-11 NOTE — Therapy (Signed)
OUTPATIENT PHYSICAL THERAPY THORACOLUMBAR     Patient Name: Melissa Roth MRN: [id number] DOB:Jan 26, 1960, 61 y.o., female Today's Date: 08/11/2021   PT End of Session - 08/11/21 1455     Visit Number 12    Number of Visits 21    Date for PT Re-Evaluation 09/10/21    Authorization Type [employer]    Authorization Time Period 07/30/21 to 09/10/21    PT Start Time 1448    PT Stop Time 1526    PT Time Calculation (min) 38 min    Activity Tolerance Patient tolerated treatment well    Behavior During Therapy WFL for tasks assessed/performed               Past Medical History:  Diagnosis Date   Back pain    Cervical disc disorder with radiculopathy of cervical region    PONV (postoperative nausea and vomiting)    Sciatica    Vitamin D deficiency    Past Surgical History:  Procedure Laterality Date   ABDOMINAL HYSTERECTOMY     CHOLECYSTECTOMY N/A 06/07/2017   Procedure: LAPAROSCOPIC CHOLECYSTECTOMY;  Surgeon: Jimmye Norman, MD;  Location: [facility];  Service: General;  Laterality: N/A;   FOOT SURGERY     LUMBAR LAMINECTOMY/DECOMPRESSION MICRODISCECTOMY Left 11/10/2016   Procedure: Left L5-S1 discectomy;  Surgeon: Venita Lick, MD;  Location: [facility];  Service: Orthopedics;  Laterality: Left;  120 mins   Patient Active Problem List   Diagnosis Date Noted   Family history of coronary artery disease 12/19/2019   Prediabetes 11/06/2019   Class 1 obesity due to excess calories with body mass index (BMI) of 32.0 to 32.9 in adult 11/05/2019   Bunion 01/19/2019   Pain in right knee 01/02/2019   Cervical radiculopathy 02/07/2018   Chronic pain syndrome 10/03/2017   Closed fracture of second toe of left foot 08/23/2017   DDD (degenerative disc disease), cervical 03/28/2017   Lumbar post-laminectomy syndrome 03/28/2017   Status post lumbar spine surgery for decompression of spinal cord 11/10/2016   Plantar fasciitis 02/03/2016   Left shoulder pain 12/26/2012   Low back pain 09/25/2012    Cervical pain (neck) 09/25/2012    PCP: Jarrett Soho, PA-C  REFERRING PROVIDER: Venita Lick, MD  REFERRING DIAG: M54.50 (ICD-10-CM) - Low back pain, unspecified  Rationale for Evaluation and Treatment Rehabilitation  THERAPY DIAG:  Other low back pain  Difficulty walking  Muscle weakness (generalized)  Stiffness of left shoulder, not elsewhere classified  Abnormal posture  ONSET DATE: 05/2021  SUBJECTIVE:  SUBJECTIVE STATEMENT: Pt reports she is starting to be able to lift L shoulder higher. (Observed abdct to 90 and able to reach top of head, but very guarded).  She reports the weather is affecting her back.   PERTINENT HISTORY:   2018 Lumbar laminectomy/decompression microdiscectomy (Left) Pre-diabetic C/S radic   PAIN:  Are you having pain? yes: NPRS scale: 7/10 Pain location: left shoulder, lower back Pain description: dull, sore  Aggravating factors: using it  Relieving factors: injection, tylenol  LB 1-2/10   PRECAUTIONS: None  WEIGHT BEARING RESTRICTIONS No  FALLS:  Has patient fallen in last 6 months? No  LIVING ENVIRONMENT: Lives with: lives with their family Lives in: House/apartment Stairs: No Has following equipment at home: None  OCCUPATION: [place]; at a desk   PLOF: Independent  PATIENT GOALS :   OBJECTIVE: * Findings taken at [facility] unless otherwise noted.   DIAGNOSTIC FINDINGS:  N/A  PATIENT SURVEYS:  FOTO 53.7 back, L shoulder 47.4  63 @ DC 3 pts  SCREENING FOR RED FLAGS: Bowel or bladder incontinence: No Spinal tumors: No Cauda equina syndrome: No Compression fracture: No   COGNITION:  Overall cognitive status: Within functional limits for tasks assessed     SENSATION: Light touch: Impaired   POSTURE: decreased lumbar  lordosis  PALPATION: TTP and hypertonicity of bilat L3-5 lumbar paraspinals; bilat QL; bilat SIJ, glutes, and hip rotators; L deltoid in spasm all groups and very TTP, proximal biceps TTP  LUMBAR ROM:   Active  A/PROM  eval 7/27  Flexion 50% p! 25% limited   Extension 25% p! 25% limited   Right lateral flexion 75% p! 25% limited   Left lateral flexion 75% p! 25% limited   Right rotation 75% p! WNL   Left rotation 75% p! WNL    (Blank rows = not tested)  LOWER EXTREMITY ROM:   too sensitive,painful to test following AROM and lumbar testing, 7/10 pain  SHOULDER ROM  Flexion AROM: R 170, L 82 and painful ABD AROM: R full ROM, L 77 and painful  IR PROM L UE:62 ER PROM L UE: 70   LOWER EXTREMITY MMT:    MMT Right eval Left eval  Hip flexion 4/5 4/5  Hip extension    Hip abduction 4/5 4/5  Hip adduction Too irritable to test Too irritable to test   (Blank rows = not tested)  SHOULDER MMT:  Flexion: R 4+/5, L 4/5 in available ROM but painful ABD: R 4+/5, L 3+/5 in available ROM Extension:R 4+/5 L 3/5 IR: R 4+/5 L 4-/5 ER: R 4+/5 L 3+/5  LUMBAR SPECIAL TESTS:  Straight leg raise test: Positive, Slump test: Positive, SI Compression/distraction test: Positive, FABER test: Positive, and Trendelenburg sign: Positive  FUNCTIONAL TESTS:  5 times sit to stand: 13.2s   (significant increase in pain)  GAIT: Distance walked: 16ft  Assistive device utilized: None Level of assistance: Complete Independence Comments: Bilat Trendelenburg, decrease lumbar lordosis   TODAY'S TREATMENT  Pt seen for aquatic therapy today.  Treatment took place in water 3.25-4.8 ft in depth at the [facility] pool. Temp of water was 91.  Pt entered/exited the pool via stairs independently with bilat rail.  In 75ft+ depth:   forward / backward walking without support, side stepping with gentle abdct/ add Forward / backward high knee marching with opp arm gentle punches UE/shoulder  bilat add/abd; alternating flex/ext (90d to 0); Horiz add/abd under surface;  IR/ER with elbows tucked at side Return to  forward/ backward walking  Plank with hands on bench in water, alternating hip ext  STS with side step along bench in water Perched on bench in water, ab set with blue noodle pull down to thighs x 10 Arm row with hands on yellow noodle, cues for scap retraction and shoulders back Holding yellow noodle: SLS with hurdle motion (in place) x 2 each; switched to hip openers Squats in relaxed manner  Pt requires buoyancy for support and to offload joints with strengthening exercises. Viscosity of the water is needed for resistance of strengthening; water current perturbations provides challenge to standing balance unsupported, requiring increased core activation.  PATIENT EDUCATION:  Education details: POC moving forward, lumbar and shoulder findings, shoulder HEP   Person educated: Patient Education method: explanation, demonstration Education comprehension: verbalized understanding, returned demonstration, verbal cues required, and tactile cues required   HOME EXERCISE PROGRAM:  Access Code: [email or web address] URL: [email or web address] Date: 06/23/2021 Prepared by: Zebedee Iba  ASSESSMENT:  CLINICAL IMPRESSION: Pt reporting elimination of shoulder/ back pain while in water. She requires min-mod cues for improved scapular positioning.  She requires demo of exercise for improved form. Overall, Lt shoulder ROM improving. Progressing towards remaining goals.      OBJECTIVE IMPAIRMENTS Abnormal gait, decreased activity tolerance, decreased balance, decreased mobility, difficulty walking, decreased ROM, decreased strength, hypomobility, increased muscle spasms, impaired flexibility, impaired sensation, improper body mechanics, postural dysfunction, and pain.   ACTIVITY LIMITATIONS carrying, lifting, bending, sitting, standing, squatting, sleeping, stairs, transfers,  and locomotion level  PARTICIPATION LIMITATIONS: cleaning, laundry, interpersonal relationship, driving, shopping, community activity, yard work, and exercise  PERSONAL FACTORS Age, Fitness, Time since onset of injury/illness/exacerbation, and 1-2 comorbidities:    are also affecting patient's functional outcome.   REHAB POTENTIAL: Fair    CLINICAL DECISION MAKING: Evolving/moderate complexity  EVALUATION COMPLEXITY: Moderate   GOALS:  SHORT TERM GOALS: Target date: 08/20/2021  Pt will become independent with HEP in order to demonstrate synthesis of PT education.   Goal status: IN PROGRESS "so so"   2.  Pt will report at least 2 pt reduction on NPRS scale for pain in order to demonstrate functional improvement with household activity, self care, and ADL.   Goal status: Achieved - 07/21/21  3.   Pt will score at least 3 pt increase on FOTO to demonstrate functional improvement in MCII and pt perceived function.    Goal status: INITIAL  4.  Pain in L shoulder to be no more than 3/10 at worst  Baseline:  Goal status: INITIAL  5..  Will have better understanding of posture and ergonomics  Baseline:  Goal status: INITIAL      LONG TERM GOALS: Target date: 09/10/2021  Pt  will become independent with final HEP in order to demonstrate synthesis of PT education.  Goal status: INITIAL  2.  Pt will score >/= 63 on FOTO to demonstrate improvement in perceived lumbar function.  Goal status: IN PROGRESS 53.7  3.  Pt will be able to perform 5XSTS in under 12s  in order to demonstrate functional improvement above the cut off score for adults.   Goal status: IN PROGRESS12 seconds exactly   4.  Pt will be able to demonstrate/report ability to sit/stand/sleep for extended periods of time without pain in order to demonstrate functional improvement and tolerance to static positioning.    Goal status: IN PROGRESS have been getting good sleep since last Friday but had problems with  sleep prior to this   5.  AROM  in L shoulder will be within 10 degrees of R shoulder in all directions  Baseline:  Goal status: INITIAL  6.  Will be able to use L UE for dressing/ADLs/hygiene without increase in pain  Baseline:  Goal status: INITIAL  7.  Will be able to reach overhead to turn a fan on/off or put a light object on a shelf overhead with LUE without increase in pain  Baseline:  Goal status: INITIAL       PLAN: PT FREQUENCY: 2x/week  PT DURATION: 6 weeks (likely DC in 8 wks)  PLANNED INTERVENTIONS: Therapeutic exercises, Therapeutic activity, Neuromuscular re-education, Balance training, Gait training, Patient/Family education, Joint manipulation, Joint mobilization, Stair training, Orthotic/Fit training, DME instructions, Aquatic Therapy, Dry Needling, Electrical stimulation, Spinal manipulation, Spinal mobilization, Cryotherapy, Moist heat, scar mobilization, Splintting, Taping, Vasopneumatic device, Traction, Ultrasound, Ionotophoresis 4mg /ml Dexamethasone, Manual therapy, and Re-evaluation.  PLAN FOR NEXT SESSION:  continue aquatics, lumbar ROM/flexbility,  lumbopelvic strength, hip stability; shoulder ROM and strength as able and tolerated   Kerin Perna, PTA 08/11/21 3:28 PM

## 2021-08-12 ENCOUNTER — Encounter (INDEPENDENT_AMBULATORY_CARE_PROVIDER_SITE_OTHER): Payer: Self-pay

## 2021-08-13 ENCOUNTER — Ambulatory Visit (HOSPITAL_BASED_OUTPATIENT_CLINIC_OR_DEPARTMENT_OTHER): Payer: 59 | Admitting: Physical Therapy

## 2021-08-13 ENCOUNTER — Encounter (HOSPITAL_BASED_OUTPATIENT_CLINIC_OR_DEPARTMENT_OTHER): Payer: Self-pay | Admitting: Physical Therapy

## 2021-08-13 DIAGNOSIS — M5459 Other low back pain: Secondary | ICD-10-CM | POA: Diagnosis not present

## 2021-08-13 DIAGNOSIS — M6281 Muscle weakness (generalized): Secondary | ICD-10-CM

## 2021-08-13 DIAGNOSIS — M25612 Stiffness of left shoulder, not elsewhere classified: Secondary | ICD-10-CM

## 2021-08-13 DIAGNOSIS — R262 Difficulty in walking, not elsewhere classified: Secondary | ICD-10-CM

## 2021-08-13 DIAGNOSIS — R293 Abnormal posture: Secondary | ICD-10-CM

## 2021-08-13 NOTE — Therapy (Signed)
OUTPATIENT PHYSICAL THERAPY THORACOLUMBAR / SHOULDER  TREATMENT NOTE    Patient Name: MEHA GOON MRN: [id number] DOB:1960-07-22, 61 y.o., female Today's Date: 08/13/2021   PT End of Session - 08/13/21 1003     Visit Number 13    Number of Visits 21    Date for PT Re-Evaluation 09/10/21    Authorization Type Aetna    Authorization Time Period 07/30/21 to 09/10/21    PT Start Time 0945    PT Stop Time 1028    PT Time Calculation (min) 43 min    Activity Tolerance Patient tolerated treatment well    Behavior During Therapy WFL for tasks assessed/performed               Past Medical History:  Diagnosis Date   Back pain    Cervical disc disorder with radiculopathy of cervical region    PONV (postoperative nausea and vomiting)    Sciatica    Vitamin D deficiency    Past Surgical History:  Procedure Laterality Date   ABDOMINAL HYSTERECTOMY     CHOLECYSTECTOMY N/A 06/07/2017   Procedure: LAPAROSCOPIC CHOLECYSTECTOMY;  Surgeon: Judeth Horn, MD;  Location: [facility];  Service: General;  Laterality: N/A;   FOOT SURGERY     LUMBAR LAMINECTOMY/DECOMPRESSION MICRODISCECTOMY Left 11/10/2016   Procedure: Left L5-S1 discectomy;  Surgeon: Melina Schools, MD;  Location: [place];  Service: Orthopedics;  Laterality: Left;  120 mins   Patient Active Problem List   Diagnosis Date Noted   Family history of coronary artery disease 12/19/2019   Prediabetes 11/06/2019   Class 1 obesity due to excess calories with body mass index (BMI) of 32.0 to 32.9 in adult 11/05/2019   Bunion 01/19/2019   Pain in right knee 01/02/2019   Cervical radiculopathy 02/07/2018   Chronic pain syndrome 10/03/2017   Closed fracture of second toe of left foot 08/23/2017   DDD (degenerative disc disease), cervical 03/28/2017   Lumbar post-laminectomy syndrome 03/28/2017   Status post lumbar spine surgery for decompression of spinal cord 11/10/2016   Plantar fasciitis 02/03/2016   Left shoulder pain 12/26/2012    Low back pain 09/25/2012   Cervical pain (neck) 09/25/2012    PCP: Marda Stalker, PA-C  REFERRING PROVIDER: Melina Schools, MD  REFERRING DIAG: M54.50 (ICD-10-CM) - Low back pain, unspecified  Rationale for Evaluation and Treatment Rehabilitation  THERAPY DIAG:  Other low back pain  Difficulty walking  Muscle weakness (generalized)  Stiffness of left shoulder, not elsewhere classified  Abnormal posture  ONSET DATE: 05/2021  SUBJECTIVE:  SUBJECTIVE STATEMENT: "My back is pretty good. My shoulder is naggy, but I think it's the weather".  Pt reports that the ktape on Lt shoulder may have helped.   PERTINENT HISTORY:   2018 Lumbar laminectomy/decompression microdiscectomy (Left) Pre-diabetic C/S radic   PAIN:  Are you having pain? yes: NPRS scale: 7/10 Pain location: left shoulder Pain description: dull, sore  Aggravating factors: using it  Relieving factors: injection, tylenol  LB 1-2/10   PRECAUTIONS: None  WEIGHT BEARING RESTRICTIONS No  FALLS:  Has patient fallen in last 6 months? No  LIVING ENVIRONMENT: Lives with: lives with their family Lives in: House/apartment Stairs: No Has following equipment at home: None  OCCUPATION: [place]; at a desk   PLOF: Independent  PATIENT GOALS :   OBJECTIVE: * Findings taken at [facility] unless otherwise noted.   DIAGNOSTIC FINDINGS:  N/A  PATIENT SURVEYS:  FOTO 53.7 back, L shoulder 47.4  63 @ DC 3 pts  SCREENING FOR RED FLAGS: Bowel or bladder incontinence: No Spinal tumors: No Cauda equina syndrome: No Compression fracture: No   COGNITION:  Overall cognitive status: Within functional limits for tasks assessed     SENSATION: Light touch: Impaired   POSTURE: decreased lumbar lordosis  PALPATION: TTP and  hypertonicity of bilat L3-5 lumbar paraspinals; bilat QL; bilat SIJ, glutes, and hip rotators; L deltoid in spasm all groups and very TTP, proximal biceps TTP  LUMBAR ROM:   Active  A/PROM  eval 7/27  Flexion 50% p! 25% limited   Extension 25% p! 25% limited   Right lateral flexion 75% p! 25% limited   Left lateral flexion 75% p! 25% limited   Right rotation 75% p! WNL   Left rotation 75% p! WNL    (Blank rows = not tested)  LOWER EXTREMITY ROM:   too sensitive,painful to test following AROM and lumbar testing, 7/10 pain  SHOULDER ROM  Flexion AROM: R 170, L 82 and painful ABD AROM: R full ROM, L 77 and painful  IR PROM L UE:62 ER PROM L UE: 70   LOWER EXTREMITY MMT:    MMT Right eval Left eval  Hip flexion 4/5 4/5  Hip extension    Hip abduction 4/5 4/5  Hip adduction Too irritable to test Too irritable to test   (Blank rows = not tested)  SHOULDER MMT:  Flexion: R 4+/5, L 4/5 in available ROM but painful ABD: R 4+/5, L 3+/5 in available ROM Extension:R 4+/5 L 3/5 IR: R 4+/5 L 4-/5 ER: R 4+/5 L 3+/5  LUMBAR SPECIAL TESTS:  Straight leg raise test: Positive, Slump test: Positive, SI Compression/distraction test: Positive, FABER test: Positive, and Trendelenburg sign: Positive  FUNCTIONAL TESTS:  5 times sit to stand: 13.2s   (significant increase in pain)  GAIT: Distance walked: 35ft  Assistive device utilized: None Level of assistance: Complete Independence Comments: Bilat Trendelenburg, decrease lumbar lordosis   TODAY'S TREATMENT  Pt seen for aquatic therapy today.  Treatment took place in water 3.25-4.8 ft in depth at the [facility] pool. Temp of water was 91.  Pt entered/exited the pool via stairs independently with bilat rail.  Self care (on land):  pt instructed in self massage to Lt shoulder (periscapular) and lumbar back musculature with ball against wall. Pt returned demo with cues.    In 78ft+ depth:   forward / backward walking  without support, side stepping with gentle abdct/ add Red small paddles on hands:  walking backwards with hands a side;  bilat shoulder add/abd; Lt flex/ext (90d to 0); Horiz add/abd under surface;  IR/ER with elbows tucked at side Ab set with blue noodle pull to thighs x 10 Holding yellow noodle: hip openers, curtsy squats Staggered stance with row holding yellow noodle Warrior 1 lifting yellow noodle slightly off of water, x 3 reps each LE forward Return to forward / backward walking  STS on 4th step with core engaged and eccentric lowering x 5 Fig 4 hip stretch holding bilat rails, 1x each LE  Pt requires buoyancy for support and to offload joints with strengthening exercises. Viscosity of the water is needed for resistance of strengthening; water current perturbations provides challenge to standing balance unsupported, requiring increased core activation.  PATIENT EDUCATION:  Education details: POC moving forward, lumbar and shoulder findings, shoulder HEP   Person educated: Patient Education method: explanation, demonstration Education comprehension: verbalized understanding, returned demonstration, verbal cues required, and tactile cues required   HOME EXERCISE PROGRAM:  Access Code: [email or web address] URL: [email or web address] Date: 08/13/2021 Prepared by: [facility] - [address]  Exercises - Supine Posterior Pelvic Tilt  - 2 x daily - 7 x weekly - 2 sets - 10 reps - 2 hold - Hooklying Single Knee to Chest Stretch  - 2 x daily - 7 x weekly - 1 sets - 10 reps - 5 hold - Seated Quadratus Lumborum Stretch in Chair  - 2 x daily - 7 x weekly - 1 sets - 3 reps - 30 hold - Seated Shoulder Flexion Table Top Stretch  - 3 x daily - 7 x weekly - 1 sets - 10 reps - 10 hold - Seated Shoulder Abduction Towel Slide at Table Top  - 3 x daily - 7 x weekly - 1 sets - 10 reps - 10 hold  Aquatic-  Circular Shoulder Pendulum with Table Support  - 3 x daily - 7 x weekly -  1 sets - 30 reps - Forward Backward March  - 1 x daily - 3 x weekly - 1 sets - Side Stepping  - 1 x daily - 7 x weekly - 1 sets - Bilateral Shoulder Horizontal Abduction Adduction AROM  - 1 x daily - 3 x weekly - 2 sets - 5-10 reps - Bilateral Shoulder Internal and External Rotation with Hand Paddles  - 1 x daily - 3 x weekly - 1 sets - 10 reps - Warrior I in [employer] with [employer]  - 1 x daily - 3 x weekly - 1 sets - 5 reps - Cat Cow in [place] with Pool Noodle  - 1 x daily - 3 x weekly - 1 sets - 5 reps - Standing Alternating Shoulder Flexion  - 1 x daily - 3 x weekly - 1 sets - 5-10 reps - Standing Hip Circles at [employer]  - 1 x daily - 3 x weekly - 1 sets - 10 reps - Piriformis Stretch at [employer]  - 1 x daily - 3 x weekly - 1 sets - 2 reps - 10-20 seconds seconds  hold - Hamstring Stretch at [employer]  - 1 x daily - 3 x weekly - 3 sets - 2 reps - 10-20 seconds hold - Standing 'L' Stretch at Counter  - 1 x daily - 7 x weekly - 1 sets - 2 reps - 10-20 seconds hold  ASSESSMENT:  CLINICAL IMPRESSION: Pt confident in aquatic setting and can take direction from therapist on deck.  Pt reporting  reduction of Lt shoulder pain after stretch into horiz abdct with red paddles. She requires multiple cues (demo, tactile)to bring Lt scapula into more neutral position.   She requires demo of exercise for improved form. Overall, Lt shoulder ROM improving. Progressing towards remaining goals.    OBJECTIVE IMPAIRMENTS Abnormal gait, decreased activity tolerance, decreased balance, decreased mobility, difficulty walking, decreased ROM, decreased strength, hypomobility, increased muscle spasms, impaired flexibility, impaired sensation, improper body mechanics, postural dysfunction, and pain.   ACTIVITY LIMITATIONS carrying, lifting, bending, sitting, standing, squatting, sleeping, stairs, transfers, and locomotion level  PARTICIPATION LIMITATIONS: cleaning, laundry, interpersonal relationship,  driving, shopping, community activity, yard work, and exercise  PERSONAL FACTORS Age, Fitness, Time since onset of injury/illness/exacerbation, and 1-2 comorbidities:    are also affecting patient's functional outcome.   REHAB POTENTIAL: Fair    CLINICAL DECISION MAKING: Evolving/moderate complexity  EVALUATION COMPLEXITY: Moderate   GOALS:  SHORT TERM GOALS: Target date: 08/20/2021  Pt will become independent with HEP in order to demonstrate synthesis of PT education.   Goal status: IN PROGRESS "so so"   2.  Pt will report at least 2 pt reduction on NPRS scale for pain in order to demonstrate functional improvement with household activity, self care, and ADL.   Goal status: Achieved - 07/21/21  3.   Pt will score at least 3 pt increase on FOTO to demonstrate functional improvement in MCII and pt perceived function.    Goal status: INITIAL  4.  Pain in L shoulder to be no more than 3/10 at worst  Baseline:  Goal status: INITIAL  5..  Will have better understanding of posture and ergonomics  Baseline:  Goal status: INITIAL      LONG TERM GOALS: Target date: 09/10/2021  Pt  will become independent with final HEP in order to demonstrate synthesis of PT education.  Goal status: INITIAL  2.  Pt will score >/= 63 on FOTO to demonstrate improvement in perceived lumbar function.  Goal status: IN PROGRESS 53.7  3.  Pt will be able to perform 5XSTS in under 12s  in order to demonstrate functional improvement above the cut off score for adults.   Goal status: IN PROGRESS12 seconds exactly   4.  Pt will be able to demonstrate/report ability to sit/stand/sleep for extended periods of time without pain in order to demonstrate functional improvement and tolerance to static positioning.    Goal status: IN PROGRESS have been getting good sleep since last Friday but had problems with sleep prior to this   5.  AROM in L shoulder will be within 10 degrees of R shoulder in all  directions  Baseline:  Goal status: INITIAL  6.  Will be able to use L UE for dressing/ADLs/hygiene without increase in pain  Baseline:  Goal status: INITIAL  7.  Will be able to reach overhead to turn a fan on/off or put a light object on a shelf overhead with LUE without increase in pain  Baseline:  Goal status: INITIAL       PLAN: PT FREQUENCY: 2x/week  PT DURATION: 6 weeks (likely DC in 8 wks)  PLANNED INTERVENTIONS: Therapeutic exercises, Therapeutic activity, Neuromuscular re-education, Balance training, Gait training, Patient/Family education, Joint manipulation, Joint mobilization, Stair training, Orthotic/Fit training, DME instructions, Aquatic Therapy, Dry Needling, Electrical stimulation, Spinal manipulation, Spinal mobilization, Cryotherapy, Moist heat, scar mobilization, Splintting, Taping, Vasopneumatic device, Traction, Ultrasound, Ionotophoresis 4mg /ml Dexamethasone, Manual therapy, and Re-evaluation.  PLAN FOR NEXT SESSION:  continue aquatics, lumbar ROM/flexbility,  lumbopelvic strength, hip stability; shoulder ROM and strength as able and tolerated. Issue laminated aquatic HEP/ modify as needed.   Trial of DN/manual therapy to Lt pec minor/major to assist with improved centration of Lt GH jt.   Mayer Camel, PTA 08/13/21 10:33 AM

## 2021-08-14 ENCOUNTER — Ambulatory Visit (HOSPITAL_BASED_OUTPATIENT_CLINIC_OR_DEPARTMENT_OTHER): Payer: 59 | Admitting: Physical Therapy

## 2021-08-17 ENCOUNTER — Ambulatory Visit (HOSPITAL_BASED_OUTPATIENT_CLINIC_OR_DEPARTMENT_OTHER): Payer: 59 | Admitting: Physical Therapy

## 2021-08-17 ENCOUNTER — Encounter (HOSPITAL_BASED_OUTPATIENT_CLINIC_OR_DEPARTMENT_OTHER): Payer: Self-pay | Admitting: Physical Therapy

## 2021-08-17 DIAGNOSIS — M6281 Muscle weakness (generalized): Secondary | ICD-10-CM

## 2021-08-17 DIAGNOSIS — M25612 Stiffness of left shoulder, not elsewhere classified: Secondary | ICD-10-CM

## 2021-08-17 DIAGNOSIS — R262 Difficulty in walking, not elsewhere classified: Secondary | ICD-10-CM

## 2021-08-17 DIAGNOSIS — M5459 Other low back pain: Secondary | ICD-10-CM

## 2021-08-17 DIAGNOSIS — R293 Abnormal posture: Secondary | ICD-10-CM

## 2021-08-17 DIAGNOSIS — M25512 Pain in left shoulder: Secondary | ICD-10-CM

## 2021-08-17 NOTE — Therapy (Signed)
OUTPATIENT PHYSICAL THERAPY THORACOLUMBAR / SHOULDER  TREATMENT NOTE    Patient Name: Melissa Roth MRN: [id number] DOB:[birth year], 61 y.o., female Today's Date: 08/17/2021   PT End of Session - 08/17/21 1137     Visit Number 14    Number of Visits 21    Date for PT Re-Evaluation 09/10/21    Authorization Type [employer]    Authorization Time Period 07/30/21 to 09/10/21    PT Start Time 0949    PT Stop Time 1030    PT Time Calculation (min) 41 min    Activity Tolerance Patient tolerated treatment well    Behavior During Therapy WFL for tasks assessed/performed                Past Medical History:  Diagnosis Date   Back pain    Cervical disc disorder with radiculopathy of cervical region    PONV (postoperative nausea and vomiting)    Sciatica    Vitamin D deficiency    Past Surgical History:  Procedure Laterality Date   ABDOMINAL HYSTERECTOMY     CHOLECYSTECTOMY N/A 06/07/2017   Procedure: LAPAROSCOPIC CHOLECYSTECTOMY;  Surgeon: Jimmye Norman, MD;  Location: [facility] OR;  Service: General;  Laterality: N/A;   FOOT SURGERY     LUMBAR LAMINECTOMY/DECOMPRESSION MICRODISCECTOMY Left 11/10/2016   Procedure: Left L5-S1 discectomy;  Surgeon: Venita Lick, MD;  Location: [facility] OR;  Service: Orthopedics;  Laterality: Left;  120 mins   Patient Active Problem List   Diagnosis Date Noted   Family history of coronary artery disease 12/19/2019   Prediabetes 11/06/2019   Class 1 obesity due to excess calories with body mass index (BMI) of 32.0 to 32.9 in adult 11/05/2019   Bunion 01/19/2019   Pain in right knee 01/02/2019   Cervical radiculopathy 02/07/2018   Chronic pain syndrome 10/03/2017   Closed fracture of second toe of left foot 08/23/2017   DDD (degenerative disc disease), cervical 03/28/2017   Lumbar post-laminectomy syndrome 03/28/2017   Status post lumbar spine surgery for decompression of spinal cord 11/10/2016   Plantar fasciitis 02/03/2016   Left shoulder pain 12/26/2012    Low back pain 09/25/2012   Cervical pain (neck) 09/25/2012    PCP: Jarrett Soho, PA-C  REFERRING PROVIDER: Venita Lick, MD  REFERRING DIAG: M54.50 (ICD-10-CM) - Low back pain, unspecified  Rationale for Evaluation and Treatment Rehabilitation  THERAPY DIAG:  Other low back pain  Difficulty walking  Muscle weakness (generalized)  Stiffness of left shoulder, not elsewhere classified  Abnormal posture  Acute pain of left shoulder  ONSET DATE: 05/2021  SUBJECTIVE:  SUBJECTIVE STATEMENT: "My back is pretty good. My shoulder is much better".    PERTINENT HISTORY:   2018 Lumbar laminectomy/decompression microdiscectomy (Left) Pre-diabetic C/S radic   PAIN:  Are you having pain? yes: NPRS scale: 5/10 (shown face scale) Pain location: left shoulder, low back Pain description: dull, sore  Aggravating factors: using it  Relieving factors:  tylenol     PRECAUTIONS: None  WEIGHT BEARING RESTRICTIONS No  FALLS:  Has patient fallen in last 6 months? No  LIVING ENVIRONMENT: Lives with: lives with their family Lives in: House/apartment Stairs: No Has following equipment at home: None  OCCUPATION: WFH; at a desk   PLOF: Independent  PATIENT GOALS :   OBJECTIVE: * Findings taken at [facility] unless otherwise noted.   DIAGNOSTIC FINDINGS:  N/A  PATIENT SURVEYS:  FOTO 53.7 back, L shoulder 47.4  63 @ DC 3 pts  SCREENING FOR RED FLAGS: Bowel or bladder incontinence: No Spinal tumors: No Cauda equina syndrome: No Compression fracture: No   COGNITION:  Overall cognitive status: Within functional limits for tasks assessed     SENSATION: Light touch: Impaired   POSTURE: decreased lumbar lordosis  PALPATION: TTP and hypertonicity of bilat L3-5 lumbar paraspinals;  bilat QL; bilat SIJ, glutes, and hip rotators; L deltoid in spasm all groups and very TTP, proximal biceps TTP  LUMBAR ROM:   Active  A/PROM  eval 7/27  Flexion 50% p! 25% limited   Extension 25% p! 25% limited   Right lateral flexion 75% p! 25% limited   Left lateral flexion 75% p! 25% limited   Right rotation 75% p! WNL   Left rotation 75% p! WNL    (Blank rows = not tested)  LOWER EXTREMITY ROM:   too sensitive,painful to test following AROM and lumbar testing, 7/10 pain  SHOULDER ROM  Flexion AROM: R 170, L 82 and painful ABD AROM: R full ROM, L 77 and painful  IR PROM L UE:62 ER PROM L UE: 70   LOWER EXTREMITY MMT:    MMT Right eval Left eval  Hip flexion 4/5 4/5  Hip extension    Hip abduction 4/5 4/5  Hip adduction Too irritable to test Too irritable to test   (Blank rows = not tested)  SHOULDER MMT:  Flexion: R 4+/5, L 4/5 in available ROM but painful ABD: R 4+/5, L 3+/5 in available ROM Extension:R 4+/5 L 3/5 IR: R 4+/5 L 4-/5 ER: R 4+/5 L 3+/5  LUMBAR SPECIAL TESTS:  Straight leg raise test: Positive, Slump test: Positive, SI Compression/distraction test: Positive, FABER test: Positive, and Trendelenburg sign: Positive  FUNCTIONAL TESTS:  5 times sit to stand: 13.2s   (significant increase in pain)  GAIT: Distance walked: 37ft  Assistive device utilized: None Level of assistance: Complete Independence Comments: Bilat Trendelenburg, decrease lumbar lordosis   TODAY'S TREATMENT  Pt seen for aquatic therapy today.  Treatment took place in water 3.25-4.8 ft in depth at the [facility] pool. Temp of water was 91.  Pt entered/exited the pool via stairs independently with bilat rail.  In 63ft+ depth:   forward / backward walking without support, side stepping with gentle abdct/ add Bilat horiz abdct/ add on surface with rainbow hand buoys   IR/ER with elbows tucked at side (no resistance) Holding yellow noodle:  hip circles CW/CCW;  cat /  cow (unable to complete); warrior 1 lifting noodle off surface x 5 x 2 Bilat shoulder flex to/from neutral x 10; then alternating shoulder flex/neutral  holding rainbow hand buoys  Hamstring stretch with foot on 2nd step, hands on rails x 2 each LE Fig 4 hip stretch holding bilat rails, 2x each LE Return to forward unsupported walking Ab set with blue noodle pull to thighs x 10 After dried off:  I strip of reg rock tape applied from Lt deltoid tubersity and pulled with25% towards infraspinatus belly to increase postural awareness and decompress tissue.   Pt requires buoyancy for support and to offload joints with strengthening exercises. Viscosity of the water is needed for resistance of strengthening; water current perturbations provides challenge to standing balance unsupported, requiring increased core activation.  PATIENT EDUCATION:  Education details: aquatic HEP  Person educated: Patient Education method: explanation, demonstration; laminated aquatic HEP Education comprehension: verbalized understanding, returned demonstration, verbal cues required, and tactile cues required   HOME EXERCISE PROGRAM:  Access Code: [email or web address] URL: [email or web address] Date: 08/13/2021 Prepared by: [facility] - Outpatient Rehab - [address]  Exercises - Supine Posterior Pelvic Tilt  - 2 x daily - 7 x weekly - 2 sets - 10 reps - 2 hold - Hooklying Single Knee to Chest Stretch  - 2 x daily - 7 x weekly - 1 sets - 10 reps - 5 hold - Seated Quadratus Lumborum Stretch in Chair  - 2 x daily - 7 x weekly - 1 sets - 3 reps - 30 hold - Seated Shoulder Flexion Table Top Stretch  - 3 x daily - 7 x weekly - 1 sets - 10 reps - 10 hold - Seated Shoulder Abduction Towel Slide at Table Top  - 3 x daily - 7 x weekly - 1 sets - 10 reps - 10 hold  Aquatic-  Circular Shoulder Pendulum with Table Support  - 3 x daily - 7 x weekly - 1 sets - 30 reps - Forward Backward March  - 1 x daily - 3 x weekly - 1  sets - Side Stepping  - 1 x daily - 7 x weekly - 1 sets - Bilateral Shoulder Horizontal Abduction Adduction AROM  - 1 x daily - 3 x weekly - 2 sets - 5-10 reps - Bilateral Shoulder Internal and External Rotation with Hand Paddles  - 1 x daily - 3 x weekly - 1 sets - 10 reps - Warrior I in [employer] with [employer]  - 1 x daily - 3 x weekly - 1 sets - 5 reps   - Standing Alternating Shoulder Flexion  - 1 x daily - 3 x weekly - 1 sets - 5-10 reps - Standing Hip Circles at [employer]  - 1 x daily - 3 x weekly - 1 sets - 10 reps - Piriformis Stretch at [employer]  - 1 x daily - 3 x weekly - 1 sets - 2 reps - 10-20 seconds seconds  hold - Hamstring Stretch at [employer]  - 1 x daily - 3 x weekly - 3 sets - 2 reps - 10-20 seconds hold - Standing 'L' Stretch at [employer]  - 1 x daily - 7 x weekly - 1 sets - 2 reps - 10-20 seconds hold  ASSESSMENT:  CLINICAL IMPRESSION: Pt demonstrating improved L shoulder flexion with less pain; visually almost equal flexion to RUE and able to lift noodle towards ceiling without difficulty.  Issued laminated aquatic HEP and reviewed with pt to ensure understanding.  She continues to require demo of exercise for improved form and improved L scapular positioning.  Progressing towards remaining goals.  OBJECTIVE IMPAIRMENTS Abnormal gait, decreased activity tolerance, decreased balance, decreased mobility, difficulty walking, decreased ROM, decreased strength, hypomobility, increased muscle spasms, impaired flexibility, impaired sensation, improper body mechanics, postural dysfunction, and pain.   ACTIVITY LIMITATIONS carrying, lifting, bending, sitting, standing, squatting, sleeping, stairs, transfers, and locomotion level  PARTICIPATION LIMITATIONS: cleaning, laundry, interpersonal relationship, driving, shopping, community activity, yard work, and exercise  PERSONAL FACTORS Age, Fitness, Time since onset of injury/illness/exacerbation, and 1-2 comorbidities:    are  also affecting patient's functional outcome.   REHAB POTENTIAL: Fair    CLINICAL DECISION MAKING: Evolving/moderate complexity  EVALUATION COMPLEXITY: Moderate   GOALS:  SHORT TERM GOALS: Target date: 08/20/2021  Pt will become independent with HEP in order to demonstrate synthesis of PT education.   Goal status: IN PROGRESS "so so"   2.  Pt will report at least 2 pt reduction on NPRS scale for pain in order to demonstrate functional improvement with household activity, self care, and ADL.   Goal status: Achieved - 07/21/21  3.   Pt will score at least 3 pt increase on FOTO to demonstrate functional improvement in [facility] and pt perceived function.    Goal status: INITIAL  4.  Pain in L shoulder to be no more than 3/10 at worst  Baseline:  Goal status: INITIAL  5..  Will have better understanding of posture and ergonomics  Baseline:  Goal status: INITIAL      LONG TERM GOALS: Target date: 09/10/2021  Pt  will become independent with final HEP in order to demonstrate synthesis of PT education.  Goal status: INITIAL  2.  Pt will score >/= 63 on FOTO to demonstrate improvement in perceived lumbar function.  Goal status: IN PROGRESS 53.7  3.  Pt will be able to perform 5XSTS in under 12s  in order to demonstrate functional improvement above the cut off score for adults.   Goal status: IN PROGRESS12 seconds exactly   4.  Pt will be able to demonstrate/report ability to sit/stand/sleep for extended periods of time without pain in order to demonstrate functional improvement and tolerance to static positioning.    Goal status: IN PROGRESS have been getting good sleep since last Friday but had problems with sleep prior to this   5.  AROM in L shoulder will be within 10 degrees of R shoulder in all directions  Baseline:  Goal status: INITIAL  6.  Will be able to use L UE for dressing/ADLs/hygiene without increase in pain  Baseline:  Goal status: INITIAL  7.  Will be able  to reach overhead to turn a fan on/off or put a light object on a shelf overhead with LUE without increase in pain  Baseline:  Goal status: INITIAL       PLAN: PT FREQUENCY: 2x/week  PT DURATION: 6 weeks (likely DC in 8 wks)  PLANNED INTERVENTIONS: Therapeutic exercises, Therapeutic activity, Neuromuscular re-education, Balance training, Gait training, Patient/Family education, Joint manipulation, Joint mobilization, Stair training, Orthotic/Fit training, DME instructions, Aquatic Therapy, Dry Needling, Electrical stimulation, Spinal manipulation, Spinal mobilization, Cryotherapy, Moist heat, scar mobilization, Splintting, Taping, Vasopneumatic device, Traction, Ultrasound, Ionotophoresis 4mg /ml Dexamethasone, Manual therapy, and Re-evaluation.  PLAN FOR NEXT SESSION:  continue aquatics, lumbar ROM/flexbility,  lumbopelvic strength, hip stability; shoulder ROM and strength as able and tolerated. Modify aquatic HEP as needed.    Trial of manual therapy to Lt pec minor/major to assist with improved centration of Lt GH jt. Measure shoulder ROM (LTG)  , PTA 08/17/21 11:38 AM

## 2021-08-18 ENCOUNTER — Ambulatory Visit (HOSPITAL_BASED_OUTPATIENT_CLINIC_OR_DEPARTMENT_OTHER): Payer: 59 | Admitting: Physical Therapy

## 2021-08-20 ENCOUNTER — Ambulatory Visit (HOSPITAL_BASED_OUTPATIENT_CLINIC_OR_DEPARTMENT_OTHER): Payer: 59 | Admitting: Physical Therapy

## 2021-08-20 ENCOUNTER — Encounter (HOSPITAL_BASED_OUTPATIENT_CLINIC_OR_DEPARTMENT_OTHER): Payer: Self-pay | Admitting: Physical Therapy

## 2021-08-20 DIAGNOSIS — M25612 Stiffness of left shoulder, not elsewhere classified: Secondary | ICD-10-CM

## 2021-08-20 DIAGNOSIS — M5459 Other low back pain: Secondary | ICD-10-CM

## 2021-08-20 DIAGNOSIS — M6281 Muscle weakness (generalized): Secondary | ICD-10-CM

## 2021-08-20 DIAGNOSIS — R262 Difficulty in walking, not elsewhere classified: Secondary | ICD-10-CM

## 2021-08-20 DIAGNOSIS — M25512 Pain in left shoulder: Secondary | ICD-10-CM

## 2021-08-20 NOTE — Therapy (Signed)
OUTPATIENT PHYSICAL THERAPY THORACOLUMBAR / SHOULDER  TREATMENT NOTE    Patient Name: Melissa Roth MRN: [id number] DOB:Nov 29, 1960, 61 y.o., female Today's Date: 08/21/2021   PT End of Session - 08/20/21 1652     Visit Number 15    Number of Visits 21    Date for PT Re-Evaluation 09/10/21    Authorization Type [employer]    Authorization Time Period 07/30/21 to 09/10/21    PT Start Time 1610    PT Stop Time 1650    PT Time Calculation (min) 40 min    Activity Tolerance Patient tolerated treatment well    Behavior During Therapy WFL for tasks assessed/performed                 Past Medical History:  Diagnosis Date   Back pain    Cervical disc disorder with radiculopathy of cervical region    PONV (postoperative nausea and vomiting)    Sciatica    Vitamin D deficiency    Past Surgical History:  Procedure Laterality Date   ABDOMINAL HYSTERECTOMY     CHOLECYSTECTOMY N/A 06/07/2017   Procedure: LAPAROSCOPIC CHOLECYSTECTOMY;  Surgeon: Jimmye Norman, MD;  Location: [facility] OR;  Service: General;  Laterality: N/A;   FOOT SURGERY     LUMBAR LAMINECTOMY/DECOMPRESSION MICRODISCECTOMY Left 11/10/2016   Procedure: Left L5-S1 discectomy;  Surgeon: Venita Lick, MD;  Location: [facility] OR;  Service: Orthopedics;  Laterality: Left;  120 mins   Patient Active Problem List   Diagnosis Date Noted   Family history of coronary artery disease 12/19/2019   Prediabetes 11/06/2019   Class 1 obesity due to excess calories with body mass index (BMI) of 32.0 to 32.9 in adult 11/05/2019   Bunion 01/19/2019   Pain in right knee 01/02/2019   Cervical radiculopathy 02/07/2018   Chronic pain syndrome 10/03/2017   Closed fracture of second toe of left foot 08/23/2017   DDD (degenerative disc disease), cervical 03/28/2017   Lumbar post-laminectomy syndrome 03/28/2017   Status post lumbar spine surgery for decompression of spinal cord 11/10/2016   Plantar fasciitis 02/03/2016   Left shoulder pain 12/26/2012    Low back pain 09/25/2012   Cervical pain (neck) 09/25/2012    PCP: Jarrett Soho, PA-C  REFERRING PROVIDER: Venita Lick, MD  REFERRING DIAG: M54.50 (ICD-10-CM) - Low back pain, unspecified  Rationale for Evaluation and Treatment Rehabilitation  THERAPY DIAG:  Other low back pain  Difficulty walking  Muscle weakness (generalized)  Stiffness of left shoulder, not elsewhere classified  Acute pain of left shoulder  ONSET DATE: 05/2021  SUBJECTIVE:  SUBJECTIVE STATEMENT: Pt reports her back and L shoulder are improving.  Pt reports she felt better after prior Rx.  Pt is able to reach higher with L UE.   PERTINENT HISTORY:   2018 Lumbar laminectomy/decompression microdiscectomy (Left) Pre-diabetic C/S radic   PAIN:  Are you having pain? yes: NPRS scale: 5/10 (shown face scale) Pain location: left shoulder, low back Pain description: dull, sore  Aggravating factors: using it  Relieving factors:  tylenol     PRECAUTIONS: None  WEIGHT BEARING RESTRICTIONS No  FALLS:  Has patient fallen in last 6 months? No  LIVING ENVIRONMENT: Lives with: lives with their family Lives in: House/apartment Stairs: No Has following equipment at home: None  OCCUPATION: WFH; at a desk   PLOF: Independent  PATIENT GOALS :   [facility]   DIAGNOSTIC FINDINGS:  N/A  PATIENT SURVEYS:  FOTO 53.7 back, L shoulder 47.4  63 @ DC 3 pts  SCREENING FOR RED FLAGS: Bowel or bladder incontinence: No Spinal tumors: No Cauda equina syndrome: No Compression fracture: No   COGNITION:  Overall cognitive status: Within functional limits for tasks assessed     SENSATION: Light touch: Impaired   POSTURE: decreased lumbar  lordosis  PALPATION: TTP and hypertonicity of bilat L3-5 lumbar paraspinals; bilat QL; bilat SIJ, glutes, and hip rotators; L deltoid in spasm all groups and very TTP, proximal biceps TTP  LUMBAR ROM:   Active  A/PROM  eval 7/27  Flexion 50% p! 25% limited   Extension 25% p! 25% limited   Right lateral flexion 75% p! 25% limited   Left lateral flexion 75% p! 25% limited   Right rotation 75% p! WNL   Left rotation 75% p! WNL    (Blank rows = not tested)  LOWER EXTREMITY ROM:   too sensitive,painful to test following AROM and lumbar testing, 7/10 pain  SHOULDER ROM  Flexion AROM: R 170, L 82 and painful ABD AROM: R full ROM, L 77 and painful  IR PROM L UE:62 ER PROM L UE: 70   LOWER EXTREMITY MMT:    MMT Right eval Left eval  Hip flexion 4/5 4/5  Hip extension    Hip abduction 4/5 4/5  Hip adduction Too irritable to test Too irritable to test   (Blank rows = not tested)  SHOULDER MMT:  Flexion: R 4+/5, L 4/5 in available ROM but painful ABD: R 4+/5, L 3+/5 in available ROM Extension:R 4+/5 L 3/5 IR: R 4+/5 L 4-/5 ER: R 4+/5 L 3+/5  LUMBAR SPECIAL TESTS:  Straight leg raise test: Positive, Slump test: Positive, SI Compression/distraction test: Positive, FABER test: Positive, and Trendelenburg sign: Positive  FUNCTIONAL TESTS:  5 times sit to stand: 13.2s   (significant increase in pain)  GAIT: Distance walked: 8135ft  Assistive device utilized: None Level of assistance: Complete Independence Comments: Bilat Trendelenburg, decrease lumbar lordosis   TODAY'S TREATMENT  Pt seen for aquatic therapy today.  Treatment took place in water 3.25-4.8 ft in depth at the [facility]. Temp of water was 91.  Pt entered/exited the pool via stairs independently with bilat rail.  In 854ft+ depth:   forward / backward walking without support, side stepping with gentle abdct/ add Bilat horiz abdct/ add on surface with rainbow hand buoys   IR/ER with elbows  tucked at side (no resistance) Holding yellow noodle:  hip circles CW/CCW; warrior 1 lifting noodle off surface  2 sets of 5  reps Bilat shoulder flex to/from neutral x 10; then alternating shoulder flex/neutral holding rainbow hand buoys  Hamstring stretch with foot on 2nd step, hands on rails x 2 each LE Mini squats with UE support on rail  Pt requires buoyancy for support and to offload joints with strengthening exercises. Viscosity of the water is needed for resistance of strengthening; water current perturbations provides challenge to standing balance unsupported, requiring increased core activation.  PATIENT EDUCATION:  Education details: exercise form Person educated: Patient Education method: explanation, demonstration; laminated aquatic HEP Education comprehension: verbalized understanding, returned demonstration, verbal cues required, and tactile cues required   HOME EXERCISE PROGRAM:  Access Code: [email or web address] URL: [email or web address] Date: 08/13/2021 Prepared by: [facility] [address]  Exercises - Supine Posterior Pelvic Tilt  - 2 x daily - 7 x weekly - 2 sets - 10 reps - 2 hold - Hooklying Single Knee to Chest Stretch  - 2 x daily - 7 x weekly - 1 sets - 10 reps - 5 hold - Seated Quadratus Lumborum Stretch in Chair  - 2 x daily - 7 x weekly - 1 sets - 3 reps - 30 hold - Seated Shoulder Flexion Table Top Stretch  - 3 x daily - 7 x weekly - 1 sets - 10 reps - 10 hold - Seated Shoulder Abduction Towel Slide at Table Top  - 3 x daily - 7 x weekly - 1 sets - 10 reps - 10 hold  Aquatic-  Circular Shoulder Pendulum with Table Support  - 3 x daily - 7 x weekly - 1 sets - 30 reps - Forward Backward March  - 1 x daily - 3 x weekly - 1 sets - Side Stepping  - 1 x daily - 7 x weekly - 1 sets - Bilateral Shoulder Horizontal Abduction Adduction AROM  - 1 x daily - 3 x weekly - 2 sets - 5-10 reps - Bilateral Shoulder Internal and External Rotation with  Hand Paddles  - 1 x daily - 3 x weekly - 1 sets - 10 reps - Warrior I in [employer] with [employer]  - 1 x daily - 3 x weekly - 1 sets - 5 reps   - Standing Alternating Shoulder Flexion  - 1 x daily - 3 x weekly - 1 sets - 5-10 reps - Standing Hip Circles at [employer]  - 1 x daily - 3 x weekly - 1 sets - 10 reps - Piriformis Stretch at [employer]  - 1 x daily - 3 x weekly - 1 sets - 2 reps - 10-20 seconds seconds  hold - Hamstring Stretch at [employer]  - 1 x daily - 3 x weekly - 3 sets - 2 reps - 10-20 seconds hold - Standing 'L' Stretch at [employer]  - 1 x daily - 7 x weekly - 1 sets - 2 reps - 10-20 seconds hold  ASSESSMENT:  CLINICAL IMPRESSION: Pt reports her back and shoulder are improving.  She reports improved L UE elevation and demonstrated good L UE elevation today.  Pt performed exercises well with instruction and demonstration for correct form.  Pt required instruction and cuing for correct form and positioning with warrior 1 with noodle lift and with shoulder hz abd/add with buoys.  She required much cuing for correct form with mini squats and standing HS stretch.  Pt responded well to Rx stating she felt better after Rx and reports 4.5-5/10 pain after Rx.  Pt  should benefit from cont skilled PT services to address ongoing goals and to improve overall function.    OBJECTIVE IMPAIRMENTS Abnormal gait, decreased activity tolerance, decreased balance, decreased mobility, difficulty walking, decreased ROM, decreased strength, hypomobility, increased muscle spasms, impaired flexibility, impaired sensation, improper body mechanics, postural dysfunction, and pain.   ACTIVITY LIMITATIONS carrying, lifting, bending, sitting, standing, squatting, sleeping, stairs, transfers, and locomotion level  PARTICIPATION LIMITATIONS: cleaning, laundry, interpersonal relationship, driving, shopping, community activity, yard work, and exercise  PERSONAL FACTORS Age, Fitness, Time since onset of  injury/illness/exacerbation, and 1-2 comorbidities:    are also affecting patient's functional outcome.   REHAB POTENTIAL: Fair    CLINICAL DECISION MAKING: Evolving/moderate complexity  EVALUATION COMPLEXITY: Moderate   GOALS:  SHORT TERM GOALS: Target date: 08/20/2021  Pt will become independent with HEP in order to demonstrate synthesis of PT education.   Goal status: IN PROGRESS "so so"   2.  Pt will report at least 2 pt reduction on NPRS scale for pain in order to demonstrate functional improvement with household activity, self care, and ADL.   Goal status: Achieved - 07/21/21  3.   Pt will score at least 3 pt increase on FOTO to demonstrate functional improvement in [facility] and pt perceived function.    Goal status: INITIAL  4.  Pain in L shoulder to be no more than 3/10 at worst  Baseline:  Goal status: INITIAL  5..  Will have better understanding of posture and ergonomics  Baseline:  Goal status: INITIAL      LONG TERM GOALS: Target date: 09/10/2021  Pt  will become independent with final HEP in order to demonstrate synthesis of PT education.  Goal status: INITIAL  2.  Pt will score >/= 63 on FOTO to demonstrate improvement in perceived lumbar function.  Goal status: IN PROGRESS 53.7  3.  Pt will be able to perform 5XSTS in under 12s  in order to demonstrate functional improvement above the cut off score for adults.   Goal status: IN PROGRESS12 seconds exactly   4.  Pt will be able to demonstrate/report ability to sit/stand/sleep for extended periods of time without pain in order to demonstrate functional improvement and tolerance to static positioning.    Goal status: IN PROGRESS have been getting good sleep since last Friday but had problems with sleep prior to this   5.  AROM in L shoulder will be within 10 degrees of R shoulder in all directions  Baseline:  Goal status: INITIAL  6.  Will be able to use L UE for dressing/ADLs/hygiene without increase in  pain  Baseline:  Goal status: INITIAL  7.  Will be able to reach overhead to turn a fan on/off or put a light object on a shelf overhead with LUE without increase in pain  Baseline:  Goal status: INITIAL       PLAN: PT FREQUENCY: 2x/week  PT DURATION: 6 weeks (likely DC in 8 wks)  PLANNED INTERVENTIONS: Therapeutic exercises, Therapeutic activity, Neuromuscular re-education, Balance training, Gait training, Patient/Family education, Joint manipulation, Joint mobilization, Stair training, Orthotic/Fit training, DME instructions, Aquatic Therapy, Dry Needling, Electrical stimulation, Spinal manipulation, Spinal mobilization, Cryotherapy, Moist heat, scar mobilization, Splintting, Taping, Vasopneumatic device, Traction, Ultrasound, Ionotophoresis 4mg /ml Dexamethasone, Manual therapy, and Re-evaluation.  PLAN FOR NEXT SESSION:  continue aquatics, lumbar ROM/flexbility,  lumbopelvic strength, hip stability; shoulder ROM and strength as able and tolerated. Modify aquatic HEP as needed.    Trial of manual therapy to Lt pec minor/major to  assist with improved centration of Lt GH jt. Measure shoulder ROM (LTG)  Audie Clear III PT, DPT 08/21/21 2:02 PM

## 2021-08-24 ENCOUNTER — Encounter (HOSPITAL_BASED_OUTPATIENT_CLINIC_OR_DEPARTMENT_OTHER): Payer: Self-pay | Admitting: Physical Therapy

## 2021-08-24 ENCOUNTER — Ambulatory Visit (HOSPITAL_BASED_OUTPATIENT_CLINIC_OR_DEPARTMENT_OTHER): Payer: 59 | Admitting: Physical Therapy

## 2021-08-24 DIAGNOSIS — M5459 Other low back pain: Secondary | ICD-10-CM | POA: Diagnosis not present

## 2021-08-24 DIAGNOSIS — R293 Abnormal posture: Secondary | ICD-10-CM

## 2021-08-24 DIAGNOSIS — M25612 Stiffness of left shoulder, not elsewhere classified: Secondary | ICD-10-CM

## 2021-08-24 DIAGNOSIS — M6281 Muscle weakness (generalized): Secondary | ICD-10-CM

## 2021-08-24 DIAGNOSIS — M25512 Pain in left shoulder: Secondary | ICD-10-CM

## 2021-08-24 DIAGNOSIS — R262 Difficulty in walking, not elsewhere classified: Secondary | ICD-10-CM

## 2021-08-24 NOTE — Therapy (Signed)
OUTPATIENT PHYSICAL THERAPY THORACOLUMBAR / SHOULDER  TREATMENT NOTE    Patient Name: Melissa Roth MRN: [id number] DOB:03-16-1960, 61 y.o., female Today's Date: 08/24/2021   PT End of Session - 08/24/21 0904     Visit Number 16    Number of Visits 21    Date for PT Re-Evaluation 09/10/21    Authorization Type A[employer]    Authorization Time Period 07/30/21 to 09/10/21    PT Start Time 0900    PT Stop Time 0940    PT Time Calculation (min) 40 min    Activity Tolerance Patient tolerated treatment well    Behavior During Therapy WFL for tasks assessed/performed                 Past Medical History:  Diagnosis Date   Back pain    Cervical disc disorder with radiculopathy of cervical region    PONV (postoperative nausea and vomiting)    Sciatica    Vitamin D deficiency    Past Surgical History:  Procedure Laterality Date   ABDOMINAL HYSTERECTOMY     CHOLECYSTECTOMY N/A 06/07/2017   Procedure: LAPAROSCOPIC CHOLECYSTECTOMY;  Surgeon: Judeth Horn, MD;  Location: [facility];  Service: General;  Laterality: N/A;   FOOT SURGERY     LUMBAR LAMINECTOMY/DECOMPRESSION MICRODISCECTOMY Left 11/10/2016   Procedure: Left L5-S1 discectomy;  Surgeon: Melina Schools, MD;  Location: [facility];  Service: Orthopedics;  Laterality: Left;  120 mins   Patient Active Problem List   Diagnosis Date Noted   Family history of coronary artery disease 12/19/2019   Prediabetes 11/06/2019   Class 1 obesity due to excess calories with body mass index (BMI) of 32.0 to 32.9 in adult 11/05/2019   Bunion 01/19/2019   Pain in right knee 01/02/2019   Cervical radiculopathy 02/07/2018   Chronic pain syndrome 10/03/2017   Closed fracture of second toe of left foot 08/23/2017   DDD (degenerative disc disease), cervical 03/28/2017   Lumbar post-laminectomy syndrome 03/28/2017   Status post lumbar spine surgery for decompression of spinal cord 11/10/2016   Plantar fasciitis 02/03/2016   Left shoulder pain 12/26/2012    Low back pain 09/25/2012   Cervical pain (neck) 09/25/2012    PCP: Marda Stalker, PA-C  REFERRING PROVIDER: Melina Schools, MD  REFERRING DIAG: M54.50 (ICD-10-CM) - Low back pain, unspecified  Rationale for Evaluation and Treatment Rehabilitation  THERAPY DIAG:  Other low back pain  Difficulty walking  Muscle weakness (generalized)  Stiffness of left shoulder, not elsewhere classified  Acute pain of left shoulder  Abnormal posture  ONSET DATE: 05/2021  SUBJECTIVE:  SUBJECTIVE STATEMENT: Pt reports her back is doing very well... her L shoulder "still lets her know it's there".   She plans to join gym when she finishes PT.   PERTINENT HISTORY:   2018 Lumbar laminectomy/decompression microdiscectomy (Left) Pre-diabetic C/S radic   PAIN:  Are you having pain? yes: NPRS scale: 0 at rest;  5/10 L shoulder and 3/10 lower back with movement Pain location: see above Pain description: dull, sore  Aggravating factors: using it  Relieving factors:  tylenol     PRECAUTIONS: None  WEIGHT BEARING RESTRICTIONS No  FALLS:  Has patient fallen in last 6 months? No  LIVING ENVIRONMENT: Lives with: lives with their family Lives in: House/apartment Stairs: No Has following equipment at home: None  OCCUPATION: [employer]; at a desk   PLOF: Independent  PATIENT GOALS :   OBJECTIVE: * Findings taken at [facility] unless otherwise noted.   DIAGNOSTIC FINDINGS:  N/A  PATIENT SURVEYS:  FOTO 53.7 back, L shoulder 47.4  63 @ DC 3 pts  SCREENING FOR RED FLAGS: Bowel or bladder incontinence: No Spinal tumors: No Cauda equina syndrome: No Compression fracture: No   COGNITION:  Overall cognitive status: Within functional limits for tasks assessed     SENSATION: Light touch: Impaired    POSTURE: decreased lumbar lordosis  PALPATION: TTP and hypertonicity of bilat L3-5 lumbar paraspinals; bilat QL; bilat SIJ, glutes, and hip rotators; L deltoid in spasm all groups and very TTP, proximal biceps TTP  LUMBAR ROM:   Active  A/PROM  eval 7/27  Flexion 50% p! 25% limited   Extension 25% p! 25% limited   Right lateral flexion 75% p! 25% limited   Left lateral flexion 75% p! 25% limited   Right rotation 75% p! WNL   Left rotation 75% p! WNL    (Blank rows = not tested)  LOWER EXTREMITY ROM:   too sensitive,painful to test following AROM and lumbar testing, 7/10 pain  SHOULDER ROM   ROM Right  Left  Left  08/24/21  shoulder flexion A 170 A 82 pain A 157  Shoulder extension     Shoulder abduction  A 77 pain  A 158  Shoulder IR  PROM 62 A thumb to T10  Shoulder ER   PROM 70 A hand behind head;  89 at 90 abdct      LOWER EXTREMITY MMT:    MMT Right eval Left eval  Hip flexion 4/5 4/5  Hip extension    Hip abduction 4/5 4/5  Hip adduction Too irritable to test Too irritable to test   (Blank rows = not tested)  SHOULDER MMT:  Flexion: R 4+/5, L 4/5 in available ROM but painful ABD: R 4+/5, L 3+/5 in available ROM Extension:R 4+/5 L 3/5 IR: R 4+/5 L 4-/5 ER: R 4+/5 L 3+/5  LUMBAR SPECIAL TESTS:  Straight leg raise test: Positive, Slump test: Positive, SI Compression/distraction test: Positive, FABER test: Positive, and Trendelenburg sign: Positive  FUNCTIONAL TESTS:  5 times sit to stand: 13.2s   (significant increase in pain)  GAIT: Distance walked: 19f  Assistive device utilized: None Level of assistance: Complete Independence Comments: Bilat Trendelenburg, decrease lumbar lordosis   TODAY'S TREATMENT  Pt seen for aquatic therapy today.  Treatment took place in water 3.25-4.8 ft [facility] Temp of water was 91.  Pt entered/exited the pool via stairs independently with bilat rail.  In 462f depth:    forward / backward walking without support,  side stepping with gentle abdct/ add; high knee marching  Holding yellow noodle:  hip circles CW/CCW; hip openers; warrior 1 lifting noodle off surface  2 sets of 5  reps Bilat horiz abdct/ add on surface with rainbow hand buoys; row motion x 10; abdct / add x 10 Bilat shoulder alternating shoulder flex/neutral holding rainbow hand buoys  Return to forward, backward walking with increased speed   Squats holding rainbow hand buoys   IR/ER with elbows tucked at side, no resistance   STS from 4th step, cues for slow eccentric lowering   Suspended on yellow noodle, yellow hand buoys - cycling, hip abdct/ add     Hamstring stretch with foot on 2nd step, hands on rails x 2 each LE  L shoulder ROM measurements taken  Pt requires buoyancy for support and to offload joints with strengthening exercises. Viscosity of the water is needed for resistance of strengthening; water current perturbations provides challenge to standing balance unsupported, requiring increased core activation.  PATIENT EDUCATION:  Education details: exercise form Person educated: Patient Education method: explanation, demonstration; laminated aquatic HEP Education comprehension: verbalized understanding, returned demonstration, verbal cues required, and tactile cues required   HOME EXERCISE PROGRAM:  Access Code: [email or web address] URL: [email or web address] Date: 08/13/2021 Prepared by: [place]  Exercises - Supine Posterior Pelvic Tilt  - 2 x daily - 7 x weekly - 2 sets - 10 reps - 2 hold - Hooklying Single Knee to Chest Stretch  - 2 x daily - 7 x weekly - 1 sets - 10 reps - 5 hold - Seated Quadratus Lumborum Stretch in Chair  - 2 x daily - 7 x weekly - 1 sets - 3 reps - 30 hold - Seated Shoulder Flexion Table Top Stretch  - 3 x daily - 7 x weekly - 1 sets - 10 reps - 10 hold - Seated Shoulder Abduction Towel Slide at Table Top  - 3 x  daily - 7 x weekly - 1 sets - 10 reps - 10 hold  Aquatic-  Circular Shoulder Pendulum with Table Support  - 3 x daily - 7 x weekly - 1 sets - 30 reps - Forward Backward March  - 1 x daily - 3 x weekly - 1 sets - Side Stepping  - 1 x daily - 7 x weekly - 1 sets - Bilateral Shoulder Horizontal Abduction Adduction AROM  - 1 x daily - 3 x weekly - 2 sets - 5-10 reps - Bilateral Shoulder Internal and External Rotation with Hand Paddles  - 1 x daily - 3 x weekly - 1 sets - 10 reps - Warrior I in [employer] with [employer]  - 1 x daily - 3 x weekly - 1 sets - 5 reps   - Standing Alternating Shoulder Flexion  - 1 x daily - 3 x weekly - 1 sets - 5-10 reps - Standing Hip Circles at [employer]  - 1 x daily - 3 x weekly - 1 sets - 10 reps - Piriformis Stretch at [employer]  - 1 x daily - 3 x weekly - 1 sets - 2 reps - 10-20 seconds seconds  hold - Hamstring Stretch at [employer]  - 1 x daily - 3 x weekly - 3 sets - 2 reps - 10-20 seconds hold - Standing 'L' Stretch at Counter  - 1 x daily - 7 x weekly - 1 sets - 2 reps - 10-20 seconds hold  ASSESSMENT:  CLINICAL IMPRESSION: Pt reports she is pain-free while exercising in the water.  She continues to require min-mod cueing for form of exercise.  Her Lt shoulder ROM is much improved; has met her ROM goals.   Pt should benefit from cont skilled PT services to address ongoing goals and to improve overall function.    OBJECTIVE IMPAIRMENTS Abnormal gait, decreased activity tolerance, decreased balance, decreased mobility, difficulty walking, decreased ROM, decreased strength, hypomobility, increased muscle spasms, impaired flexibility, impaired sensation, improper body mechanics, postural dysfunction, and pain.   ACTIVITY LIMITATIONS carrying, lifting, bending, sitting, standing, squatting, sleeping, stairs, transfers, and locomotion level  PARTICIPATION LIMITATIONS: cleaning, laundry, interpersonal relationship, driving, shopping, community activity, yard  work, and exercise  PERSONAL FACTORS Age, Fitness, Time since onset of injury/illness/exacerbation, and 1-2 comorbidities:    are also affecting patient's functional outcome.   REHAB POTENTIAL: Fair    CLINICAL DECISION MAKING: Evolving/moderate complexity  EVALUATION COMPLEXITY: Moderate   GOALS:  SHORT TERM GOALS: Target date: 08/20/2021  Pt will become independent with HEP in order to demonstrate synthesis of PT education.   Goal status: IN PROGRESS "so so"   2.  Pt will report at least 2 pt reduction on NPRS scale for pain in order to demonstrate functional improvement with household activity, self care, and ADL.   Goal status: Achieved - 07/21/21  3.   Pt will score at least 3 pt increase on FOTO to demonstrate functional improvement in MCII and pt perceived function.    Goal status: INITIAL  4.  Pain in L shoulder to be no more than 3/10 at worst  Baseline: 5/10 at worst -8/21 Goal status:Ongoing   5..  Will have better understanding of posture and ergonomics  Baseline:  Goal status: INITIAL      LONG TERM GOALS: Target date: 09/10/2021  Pt  will become independent with final HEP in order to demonstrate synthesis of PT education.  Goal status: INITIAL  2.  Pt will score >/= 63 on FOTO to demonstrate improvement in perceived lumbar function.  Goal status: IN PROGRESS 53.7  3.  Pt will be able to perform 5XSTS in under 12s  in order to demonstrate functional improvement above the cut off score for adults.   Goal status: IN PROGRESS12 seconds exactly   4.  Pt will be able to demonstrate/report ability to sit/stand/sleep for extended periods of time without pain in order to demonstrate functional improvement and tolerance to static positioning.    Goal status: IN PROGRESS have been getting good sleep since last Friday but had problems with sleep prior to this   5.  AROM in L shoulder will be within 10 degrees of R shoulder in all directions  Baseline:  Goal  status: Achieved 8/21  6.  Will be able to use L UE for dressing/ADLs/hygiene without increase in pain  Baseline:  Goal status: Achieved 8/21  7.  Will be able to reach overhead to turn a fan on/off or put a light object on a shelf overhead with LUE without increase in pain  Baseline:  Goal status: Achieved 8/21       PLAN: PT FREQUENCY: 2x/week  PT DURATION: 6 weeks (likely DC in 8 wks)  PLANNED INTERVENTIONS: Therapeutic exercises, Therapeutic activity, Neuromuscular re-education, Balance training, Gait training, Patient/Family education, Joint manipulation, Joint mobilization, Stair training, Orthotic/Fit training, DME instructions, Aquatic Therapy, Dry Needling, Electrical stimulation, Spinal manipulation, Spinal mobilization, Cryotherapy, Moist heat, scar mobilization, Splintting, Taping, Vasopneumatic device, Traction, Ultrasound,  Ionotophoresis 57m/ml Dexamethasone, Manual therapy, and Re-evaluation.  PLAN FOR NEXT SESSION:  MMT Lt shoulder prior to entry in the water.  (MD appt on 9/5) Continue aquatics; begin d/c planning (vs hold on therapy).   JKerin Perna PTA 08/24/21 11:03 AM

## 2021-08-25 ENCOUNTER — Ambulatory Visit (HOSPITAL_BASED_OUTPATIENT_CLINIC_OR_DEPARTMENT_OTHER): Payer: 59 | Admitting: Physical Therapy

## 2021-08-28 ENCOUNTER — Encounter (HOSPITAL_BASED_OUTPATIENT_CLINIC_OR_DEPARTMENT_OTHER): Payer: Self-pay | Admitting: Physical Therapy

## 2021-08-28 ENCOUNTER — Ambulatory Visit (HOSPITAL_BASED_OUTPATIENT_CLINIC_OR_DEPARTMENT_OTHER): Payer: 59 | Admitting: Physical Therapy

## 2021-08-28 DIAGNOSIS — M5459 Other low back pain: Secondary | ICD-10-CM | POA: Diagnosis not present

## 2021-08-28 DIAGNOSIS — M25512 Pain in left shoulder: Secondary | ICD-10-CM

## 2021-08-28 DIAGNOSIS — M25612 Stiffness of left shoulder, not elsewhere classified: Secondary | ICD-10-CM

## 2021-08-28 DIAGNOSIS — M6281 Muscle weakness (generalized): Secondary | ICD-10-CM

## 2021-08-28 DIAGNOSIS — R262 Difficulty in walking, not elsewhere classified: Secondary | ICD-10-CM

## 2021-08-28 NOTE — Therapy (Signed)
OUTPATIENT PHYSICAL THERAPY THORACOLUMBAR / SHOULDER  TREATMENT NOTE    Patient Name: Melissa Roth MRN: [id number] DOB:1960/11/14, 61 y.o., female Today's Date: 08/28/2021   PT End of Session - 08/28/21 0949     Visit Number 17    Number of Visits 21    Date for PT Re-Evaluation 09/10/21    Authorization Type Aetna    Authorization Time Period 07/30/21 to 09/10/21    PT Start Time 0934    PT Stop Time 1015    PT Time Calculation (min) 41 min    Activity Tolerance Patient tolerated treatment well    Behavior During Therapy WFL for tasks assessed/performed                  Past Medical History:  Diagnosis Date   Back pain    Cervical disc disorder with radiculopathy of cervical region    PONV (postoperative nausea and vomiting)    Sciatica    Vitamin D deficiency    Past Surgical History:  Procedure Laterality Date   ABDOMINAL HYSTERECTOMY     CHOLECYSTECTOMY N/A 06/07/2017   Procedure: LAPAROSCOPIC CHOLECYSTECTOMY;  Surgeon: Jimmye Norman, MD;  Location: [facility];  Service: General;  Laterality: N/A;   FOOT SURGERY     LUMBAR LAMINECTOMY/DECOMPRESSION MICRODISCECTOMY Left 11/10/2016   Procedure: Left L5-S1 discectomy;  Surgeon: Venita Lick, MD;  Location: [facility];  Service: Orthopedics;  Laterality: Left;  120 mins   Patient Active Problem List   Diagnosis Date Noted   Family history of coronary artery disease 12/19/2019   Prediabetes 11/06/2019   Class 1 obesity due to excess calories with body mass index (BMI) of 32.0 to 32.9 in adult 11/05/2019   Bunion 01/19/2019   Pain in right knee 01/02/2019   Cervical radiculopathy 02/07/2018   Chronic pain syndrome 10/03/2017   Closed fracture of second toe of left foot 08/23/2017   DDD (degenerative disc disease), cervical 03/28/2017   Lumbar post-laminectomy syndrome 03/28/2017   Status post lumbar spine surgery for decompression of spinal cord 11/10/2016   Plantar fasciitis 02/03/2016   Left shoulder pain  12/26/2012   Low back pain 09/25/2012   Cervical pain (neck) 09/25/2012    PCP: Jarrett Soho, PA-C  REFERRING PROVIDER: Venita Lick, MD  REFERRING DIAG: M54.50 (ICD-10-CM) - Low back pain, unspecified  Rationale for Evaluation and Treatment Rehabilitation  THERAPY DIAG:  Other low back pain  Difficulty walking  Muscle weakness (generalized)  Stiffness of left shoulder, not elsewhere classified  Acute pain of left shoulder  ONSET DATE: 05/2021  SUBJECTIVE:  SUBJECTIVE STATEMENT: Pt states she felt ok after prior Rx.  She had a 5/10 pain after prior Rx which is normal. "From where I was, everything is better".  Pt reports improved L shoulder ROM.   PERTINENT HISTORY:   2018 Lumbar laminectomy/decompression microdiscectomy (Left) Pre-diabetic C/S radic   PAIN:  Are you having pain? yes: NPRS scale: 5/10 L shoulder and central lower lumbar Pain location: see above Pain description: dull, sore  Aggravating factors: using it  Relieving factors:  tylenol     PRECAUTIONS: None  WEIGHT BEARING RESTRICTIONS No  FALLS:  Has patient fallen in last 6 months? No  LIVING ENVIRONMENT: Lives with: lives with their family Lives in: House/apartment Stairs: No Has following equipment at home: None  OCCUPATION: WFH; at a desk   PLOF: Independent  PATIENT GOALS :   OBJECTIVE: * Findings taken at [facility] unless otherwise noted.   DIAGNOSTIC FINDINGS:  N/A  PATIENT SURVEYS:  FOTO 53.7 back, L shoulder 47.4  63 @ DC 3 pts   SHOULDER ROM   ROM Right  Left  Left  08/24/21  shoulder flexion A 170 A 82 pain A 157  Shoulder extension     Shoulder abduction  A 77 pain  A 158  Shoulder IR  PROM 62 A thumb to T10  Shoulder ER   PROM 70 A hand behind head;  89 at 90 abdct        SHOULDER MMT: Prior: Flexion: R 4+/5, L 4/5 in available ROM but painful ABD: R 4+/5, L 3+/5 in available ROM Extension:R 4+/5 L 3/5 IR: R 4+/5 L 4-/5 ER: R 4+/5 L 3+/5  TODAY'S TREATMENT  Current: Flexion:  L:  5/5 [place]:  4+/5 Abd:  4+/5 ER (seated with arm at side):  tolerates resistance, but weak.  Weaker compared to R.  IR (seated with arm at side):  Intact   -Assessed L shoulder strength and educated pt in findings   Pt seen for aquatic therapy today.  Treatment took place in water 3.25-4.8 ft in depth at the [facility] pool. Temp of water was 91.  Pt entered/exited the pool via stairs independently with bilat rail.  In 69ft+ depth:   forward / backward walking without support, side stepping with gentle abdct/ add; high knee marching  Holding yellow noodle:  hip circles CW/CCW; hip openers; warrior 1 lifting noodle off surface  2 sets of 5  reps Bilat horiz abdct/ add on surface with rainbow hand buoys Bilat shoulder alternating shoulder flex/neutral holding rainbow hand buoys  2x10 reps   Squats holding rainbow hand buoys 2x10   IR/ER with elbows tucked at side with small red paddles   Pt requires buoyancy for support and to offload joints with strengthening exercises. Viscosity of the water is needed for resistance of strengthening; water current perturbations provides challenge to standing balance unsupported, requiring increased core activation.  PATIENT EDUCATION:  Education details: exercise form, MMT findings Person educated: Patient Education method: explanation, demonstration; laminated aquatic HEP Education comprehension: verbalized understanding, returned demonstration, verbal cues required, and tactile cues required   HOME EXERCISE PROGRAM:  Access Code: [email or web address] URL: [email or web address] Date: 08/13/2021 Prepared by: [facility] - [address]  Exercises - Supine Posterior Pelvic Tilt  - 2 x  daily - 7 x weekly - 2 sets - 10 reps - 2 hold - Hooklying Single Knee to Chest Stretch  - 2 x daily - 7 x weekly - 1 sets - 10 reps -  5 hold - Seated Quadratus Lumborum Stretch in Chair  - 2 x daily - 7 x weekly - 1 sets - 3 reps - 30 hold - Seated Shoulder Flexion Table Top Stretch  - 3 x daily - 7 x weekly - 1 sets - 10 reps - 10 hold - Seated Shoulder Abduction Towel Slide at Table Top  - 3 x daily - 7 x weekly - 1 sets - 10 reps - 10 hold  Aquatic-  Circular Shoulder Pendulum with Table Support  - 3 x daily - 7 x weekly - 1 sets - 30 reps - Forward Backward March  - 1 x daily - 3 x weekly - 1 sets - Side Stepping  - 1 x daily - 7 x weekly - 1 sets - Bilateral Shoulder Horizontal Abduction Adduction AROM  - 1 x daily - 3 x weekly - 2 sets - 5-10 reps - Bilateral Shoulder Internal and External Rotation with Hand Paddles  - 1 x daily - 3 x weekly - 1 sets - 10 reps - Warrior I in [employer] with [employer]  - 1 x daily - 3 x weekly - 1 sets - 5 reps   - Standing Alternating Shoulder Flexion  - 1 x daily - 3 x weekly - 1 sets - 5-10 reps - Standing Hip Circles at [employer]  - 1 x daily - 3 x weekly - 1 sets - 10 reps - Piriformis Stretch at [employer]  - 1 x daily - 3 x weekly - 1 sets - 2 reps - 10-20 seconds seconds  hold - Hamstring Stretch at [employer]  - 1 x daily - 3 x weekly - 3 sets - 2 reps - 10-20 seconds hold - Standing 'L' Stretch at [employer]  - 1 x daily - 7 x weekly - 1 sets - 2 reps - 10-20 seconds hold  ASSESSMENT:  CLINICAL IMPRESSION: Pt reports improved pain and sx's.  PT assessed shoulder strength.  She is improving with strength t/o L shoulder though still has some weakness in ER.  Pt is progressing with aquatic exercises.  She requires cuing and instruction for correct form especially with squats.  She demonstrates improved form with cuing and instruction.  Pt responded well to Rx having improved pain from 5/10 before Rx to 4/10 after Rx.   Pt should benefit from cont  skilled PT services to address ongoing goals and to improve overall function.   OBJECTIVE IMPAIRMENTS Abnormal gait, decreased activity tolerance, decreased balance, decreased mobility, difficulty walking, decreased ROM, decreased strength, hypomobility, increased muscle spasms, impaired flexibility, impaired sensation, improper body mechanics, postural dysfunction, and pain.   ACTIVITY LIMITATIONS carrying, lifting, bending, sitting, standing, squatting, sleeping, stairs, transfers, and locomotion level  PARTICIPATION LIMITATIONS: cleaning, laundry, interpersonal relationship, driving, shopping, community activity, yard work, and exercise  PERSONAL FACTORS Age, Fitness, Time since onset of injury/illness/exacerbation, and 1-2 comorbidities:    are also affecting patient's functional outcome.   REHAB POTENTIAL: Fair    CLINICAL DECISION MAKING: Evolving/moderate complexity  EVALUATION COMPLEXITY: Moderate   GOALS:  SHORT TERM GOALS: Target date: 08/20/2021  Pt will become independent with HEP in order to demonstrate synthesis of PT education.   Goal status: IN PROGRESS "so so"   2.  Pt will report at least 2 pt reduction on NPRS scale for pain in order to demonstrate functional improvement with household activity, self care, and ADL.   Goal status: Achieved - 07/21/21  3.  Pt will score at least 3 pt increase on FOTO to demonstrate functional improvement in MCII and pt perceived function.    Goal status: INITIAL  4.  Pain in L shoulder to be no more than 3/10 at worst  Baseline: 5/10 at worst -8/21 Goal status:Ongoing   5..  Will have better understanding of posture and ergonomics  Baseline:  Goal status: INITIAL      LONG TERM GOALS: Target date: 09/10/2021  Pt  will become independent with final HEP in order to demonstrate synthesis of PT education.  Goal status: INITIAL  2.  Pt will score >/= 63 on FOTO to demonstrate improvement in perceived lumbar function.  Goal  status: IN PROGRESS 53.7  3.  Pt will be able to perform 5XSTS in under 12s  in order to demonstrate functional improvement above the cut off score for adults.   Goal status: IN PROGRESS12 seconds exactly   4.  Pt will be able to demonstrate/report ability to sit/stand/sleep for extended periods of time without pain in order to demonstrate functional improvement and tolerance to static positioning.    Goal status: IN PROGRESS have been getting good sleep since last Friday but had problems with sleep prior to this   5.  AROM in L shoulder will be within 10 degrees of R shoulder in all directions  Baseline:  Goal status: Achieved 8/21  6.  Will be able to use L UE for dressing/ADLs/hygiene without increase in pain  Baseline:  Goal status: Achieved 8/21  7.  Will be able to reach overhead to turn a fan on/off or put a light object on a shelf overhead with LUE without increase in pain  Baseline:  Goal status: Achieved 8/21       PLAN: PT FREQUENCY: 2x/week  PT DURATION: 6 weeks (likely DC in 8 wks)  PLANNED INTERVENTIONS: Therapeutic exercises, Therapeutic activity, Neuromuscular re-education, Balance training, Gait training, Patient/Family education, Joint manipulation, Joint mobilization, Stair training, Orthotic/Fit training, DME instructions, Aquatic Therapy, Dry Needling, Electrical stimulation, Spinal manipulation, Spinal mobilization, Cryotherapy, Moist heat, scar mobilization, Splintting, Taping, Vasopneumatic device, Traction, Ultrasound, Ionotophoresis 4mg /ml Dexamethasone, Manual therapy, and Re-evaluation.  PLAN FOR NEXT SESSION:   (MD appt on 9/5) Continue aquatics; begin d/c planning (vs hold on therapy).   11/5 III PT, DPT 08/28/21 2:42 PM

## 2021-08-31 NOTE — Therapy (Signed)
OUTPATIENT PHYSICAL THERAPY THORACOLUMBAR / SHOULDER  TREATMENT NOTE    Patient Name: Melissa Roth MRN: [id number] DOB:1960/09/11, 61 y.o., female Today's Date: 09/02/2021   PT End of Session - 09/01/21 0822     Visit Number 18    Number of Visits 21    Date for PT Re-Evaluation 09/10/21    Authorization Type [employer]    Authorization Time Period 07/30/21 to 09/10/21    PT Start Time 0805    PT Stop Time 0847    PT Time Calculation (min) 42 min    Activity Tolerance Patient tolerated treatment well    Behavior During Therapy WFL for tasks assessed/performed                   Past Medical History:  Diagnosis Date   Back pain    Cervical disc disorder with radiculopathy of cervical region    PONV (postoperative nausea and vomiting)    Sciatica    Vitamin D deficiency    Past Surgical History:  Procedure Laterality Date   ABDOMINAL HYSTERECTOMY     CHOLECYSTECTOMY N/A 06/07/2017   Procedure: LAPAROSCOPIC CHOLECYSTECTOMY;  Surgeon: Jimmye Norman, MD;  Location: [facility] OR;  Service: General;  Laterality: N/A;   FOOT SURGERY     LUMBAR LAMINECTOMY/DECOMPRESSION MICRODISCECTOMY Left 11/10/2016   Procedure: Left L5-S1 discectomy;  Surgeon: Venita Lick, MD;  Location: [facility] OR;  Service: Orthopedics;  Laterality: Left;  120 mins   Patient Active Problem List   Diagnosis Date Noted   Family history of coronary artery disease 12/19/2019   Prediabetes 11/06/2019   Class 1 obesity due to excess calories with body mass index (BMI) of 32.0 to 32.9 in adult 11/05/2019   Bunion 01/19/2019   Pain in right knee 01/02/2019   Cervical radiculopathy 02/07/2018   Chronic pain syndrome 10/03/2017   Closed fracture of second toe of left foot 08/23/2017   DDD (degenerative disc disease), cervical 03/28/2017   Lumbar post-laminectomy syndrome 03/28/2017   Status post lumbar spine surgery for decompression of spinal cord 11/10/2016   Plantar fasciitis 02/03/2016   Left shoulder pain  12/26/2012   Low back pain 09/25/2012   Cervical pain (neck) 09/25/2012    PCP: Jarrett Soho, PA-C  REFERRING PROVIDER: Venita Lick, MD  REFERRING DIAG: M54.50 (ICD-10-CM) - Low back pain, unspecified  Rationale for Evaluation and Treatment Rehabilitation  THERAPY DIAG:  Other low back pain  Difficulty walking  Muscle weakness (generalized)  Stiffness of left shoulder, not elsewhere classified  Acute pain of left shoulder  ONSET DATE: 05/2021  SUBJECTIVE:  SUBJECTIVE STATEMENT: Pt states her shoulder is feeling ok but her back is feeling "crazy".  She woke up with an ache in her lumbar this AM.   Pt states pain feels like it is trying to go down her L leg.  Pt used heat which helped.  Pt felt good after prior Rx, "probably 4-5/10".  Pt state she was feeling good until this AM.   PERTINENT HISTORY:   2018 Lumbar laminectomy/decompression microdiscectomy (Left) Pre-diabetic C/S radic   PAIN:  Are you having pain? yes: NPRS scale: 0/10 L shoulder and 8/10 central lower lumbar Pain location: see above Pain description: dull, sore  Aggravating factors: using it  Relieving factors:  tylenol     PRECAUTIONS: None  WEIGHT BEARING RESTRICTIONS No  FALLS:  Has patient fallen in last 6 months? No  LIVING ENVIRONMENT: Lives with: lives with their family Lives in: House/apartment Stairs: No Has following equipment at home: None  OCCUPATION: WFH; at a desk   PLOF: Independent  PATIENT GOALS :   OBJECTIVE:   DIAGNOSTIC FINDINGS:  N/A   TODAY'S TREATMENT  Pt seen for aquatic therapy today.  Treatment took place in water 4.0 ft in depth at the [facility] pool. Temp of water was 91.  Pt entered/exited the pool via stairs independently with bilat rail.  In  76ft+ depth:   forward / backward walking without support, side stepping with gentle abdct/ add; high knee marching  Holding yellow noodle:  hip circles CW/CCW; hip openers  with yellow noodle and then holding onto wall; warrior 1 lifting noodle off surface  2 sets of 5  reps Bilat horiz abdct/ add on surface with rainbow hand buoys 2x10 ; abd/add with rainbow hand buoys x20 reps Bilat shoulder alternating shoulder flex/neutral holding rainbow hand buoys  x10 reps and x10 reps without alternating   Squats holding rainbow hand buoys 2x10   IR/ER with elbows tucked at side with small red paddles 2x10  Ambulating fwd/bwd with yellow hand buoys by side x 2 laps   Pt requires buoyancy for support and to offload joints with strengthening exercises. Viscosity of the water is needed for resistance of strengthening; water current perturbations provides challenge to standing balance unsupported, requiring increased core activation.   PATIENT EDUCATION:  Education details: exercise form and POC Person educated: Patient Education method: explanation, demonstration; laminated aquatic HEP Education comprehension: verbalized understanding, returned demonstration, verbal cues required, and tactile cues required   HOME EXERCISE PROGRAM:  Access Code: [email or web address] URL: [email or web address] Date: 08/13/2021 Prepared by: [facility] - Outpatient Rehab - [address]  Exercises - Supine Posterior Pelvic Tilt  - 2 x daily - 7 x weekly - 2 sets - 10 reps - 2 hold - Hooklying Single Knee to Chest Stretch  - 2 x daily - 7 x weekly - 1 sets - 10 reps - 5 hold - Seated Quadratus Lumborum Stretch in Chair  - 2 x daily - 7 x weekly - 1 sets - 3 reps - 30 hold - Seated Shoulder Flexion Table Top Stretch  - 3 x daily - 7 x weekly - 1 sets - 10 reps - 10 hold - Seated Shoulder Abduction Towel Slide at Table Top  - 3 x daily - 7 x weekly - 1 sets - 10 reps - 10 hold  Aquatic-  Circular Shoulder Pendulum  with Table Support  - 3 x daily - 7 x weekly - 1 sets - 30 reps - Forward Backward March  -  1 x daily - 3 x weekly - 1 sets - Side Stepping  - 1 x daily - 7 x weekly - 1 sets - Bilateral Shoulder Horizontal Abduction Adduction AROM  - 1 x daily - 3 x weekly - 2 sets - 5-10 reps - Bilateral Shoulder Internal and External Rotation with Hand Paddles  - 1 x daily - 3 x weekly - 1 sets - 10 reps - Warrior I in [employer] with [employer]  - 1 x daily - 3 x weekly - 1 sets - 5 reps   - Standing Alternating Shoulder Flexion  - 1 x daily - 3 x weekly - 1 sets - 5-10 reps - Standing Hip Circles at [employer]  - 1 x daily - 3 x weekly - 1 sets - 10 reps - Piriformis Stretch at [employer]  - 1 x daily - 3 x weekly - 1 sets - 2 reps - 10-20 seconds seconds  hold - Hamstring Stretch at [employer]  - 1 x daily - 3 x weekly - 3 sets - 2 reps - 10-20 seconds hold - Standing 'L' Stretch at [employer]  - 1 x daily - 7 x weekly - 1 sets - 2 reps - 10-20 seconds hold  ASSESSMENT:  CLINICAL IMPRESSION: Pt presents to Rx stating her shoulder was feeling good though her back was bothering her.  Pt is progressing well with aquatic exercises, sx's, and function.  She states her shoulder is doing much better.  Pt performed aquatic exercises well with verbal and visual cuing for correct form.  Pt able to perform resistance exercises for shoulder in the pool without c/o's.  Pt responded well to Rx reporting improved lumbar pain from 8/10 before Rx to 6.5/10 after Rx.  Pt had no shoulder pain after Rx.   OBJECTIVE IMPAIRMENTS Abnormal gait, decreased activity tolerance, decreased balance, decreased mobility, difficulty walking, decreased ROM, decreased strength, hypomobility, increased muscle spasms, impaired flexibility, impaired sensation, improper body mechanics, postural dysfunction, and pain.   ACTIVITY LIMITATIONS carrying, lifting, bending, sitting, standing, squatting, sleeping, stairs, transfers, and locomotion  level  PARTICIPATION LIMITATIONS: cleaning, laundry, interpersonal relationship, driving, shopping, community activity, yard work, and exercise  PERSONAL FACTORS Age, Fitness, Time since onset of injury/illness/exacerbation, and 1-2 comorbidities:    are also affecting patient's functional outcome.   REHAB POTENTIAL: Fair    CLINICAL DECISION MAKING: Evolving/moderate complexity  EVALUATION COMPLEXITY: Moderate   GOALS:  SHORT TERM GOALS: Target date: 08/20/2021  Pt will become independent with HEP in order to demonstrate synthesis of PT education.   Goal status: IN PROGRESS "so so"   2.  Pt will report at least 2 pt reduction on NPRS scale for pain in order to demonstrate functional improvement with household activity, self care, and ADL.   Goal status: Achieved - 07/21/21  3.   Pt will score at least 3 pt increase on FOTO to demonstrate functional improvement in MCII and pt perceived function.    Goal status: INITIAL  4.  Pain in L shoulder to be no more than 3/10 at worst  Baseline: 5/10 at worst -8/21 Goal status:Ongoing   5..  Will have better understanding of posture and ergonomics  Baseline:  Goal status: INITIAL      LONG TERM GOALS: Target date: 09/10/2021  Pt  will become independent with final HEP in order to demonstrate synthesis of PT education.  Goal status: INITIAL  2.  Pt will score >/= 63 on FOTO  to demonstrate improvement in perceived lumbar function.  Goal status: IN PROGRESS 53.7  3.  Pt will be able to perform 5XSTS in under 12s  in order to demonstrate functional improvement above the cut off score for adults.   Goal status: IN PROGRESS12 seconds exactly   4.  Pt will be able to demonstrate/report ability to sit/stand/sleep for extended periods of time without pain in order to demonstrate functional improvement and tolerance to static positioning.    Goal status: IN PROGRESS have been getting good sleep since last Friday but had problems with  sleep prior to this   5.  AROM in L shoulder will be within 10 degrees of R shoulder in all directions  Baseline:  Goal status: Achieved 8/21  6.  Will be able to use L UE for dressing/ADLs/hygiene without increase in pain  Baseline:  Goal status: Achieved 8/21  7.  Will be able to reach overhead to turn a fan on/off or put a light object on a shelf overhead with LUE without increase in pain  Baseline:  Goal status: Achieved 8/21       PLAN: PT FREQUENCY: 2x/week  PT DURATION: 6 weeks (likely DC in 8 wks)  PLANNED INTERVENTIONS: Therapeutic exercises, Therapeutic activity, Neuromuscular re-education, Balance training, Gait training, Patient/Family education, Joint manipulation, Joint mobilization, Stair training, Orthotic/Fit training, DME instructions, Aquatic Therapy, Dry Needling, Electrical stimulation, Spinal manipulation, Spinal mobilization, Cryotherapy, Moist heat, scar mobilization, Splintting, Taping, Vasopneumatic device, Traction, Ultrasound, Ionotophoresis 4mg /ml Dexamethasone, Manual therapy, and Re-evaluation.  PLAN FOR NEXT SESSION:   (MD appt on 9/5)  continue aquatics.  Land visit next Rx.   11/5 III PT, DPT 09/02/21 2:56 PM

## 2021-09-01 ENCOUNTER — Encounter (HOSPITAL_BASED_OUTPATIENT_CLINIC_OR_DEPARTMENT_OTHER): Payer: Self-pay | Admitting: Physical Therapy

## 2021-09-01 ENCOUNTER — Ambulatory Visit (HOSPITAL_BASED_OUTPATIENT_CLINIC_OR_DEPARTMENT_OTHER): Payer: 59 | Admitting: Physical Therapy

## 2021-09-01 DIAGNOSIS — M6281 Muscle weakness (generalized): Secondary | ICD-10-CM

## 2021-09-01 DIAGNOSIS — M25512 Pain in left shoulder: Secondary | ICD-10-CM

## 2021-09-01 DIAGNOSIS — M25612 Stiffness of left shoulder, not elsewhere classified: Secondary | ICD-10-CM

## 2021-09-01 DIAGNOSIS — M5459 Other low back pain: Secondary | ICD-10-CM | POA: Diagnosis not present

## 2021-09-01 DIAGNOSIS — R262 Difficulty in walking, not elsewhere classified: Secondary | ICD-10-CM

## 2021-09-03 ENCOUNTER — Encounter (HOSPITAL_BASED_OUTPATIENT_CLINIC_OR_DEPARTMENT_OTHER): Payer: 59 | Admitting: Physical Therapy

## 2021-09-06 NOTE — Therapy (Signed)
OUTPATIENT PHYSICAL THERAPY THORACOLUMBAR / SHOULDER  TREATMENT NOTE    Patient Name: KENNIYA WESTRICH MRN: [id number] DOB:21-Dec-1960, 61 y.o., female Today's Date: 09/08/2021   PT End of Session - 09/08/21 0828     Visit Number 19    Number of Visits 21    Date for PT Re-Evaluation 09/10/21    Authorization Type [employer]et[employer]    PT Start Time 0802    PT Stop Time 0845    PT Time Calculation (min) 43 min    Activity Tolerance Patient tolerated treatment well    Behavior During Therapy WFL for tasks assessed/performed                    Past Medical History:  Diagnosis Date   Back pain    Cervical disc disorder with radiculopathy of cervical region    PONV (postoperative nausea and vomiting)    Sciatica    Vitamin D deficiency    Past Surgical History:  Procedure Laterality Date   ABDOMINAL HYSTERECTOMY     CHOLECYSTECTOMY N/A 06/07/2017   Procedure: LAPAROSCOPIC CHOLECYSTECTOMY;  Surgeon: Jimmye Norman, MD;  Location: [facility] OR;  Service: General;  Laterality: N/A;   FOOT SURGERY     LUMBAR LAMINECTOMY/DECOMPRESSION MICRODISCECTOMY Left 11/10/2016   Procedure: Left L5-S1 discectomy;  Surgeon: Venita Lick, MD;  Location: [facility] OR;  Service: Orthopedics;  Laterality: Left;  120 mins   Patient Active Problem List   Diagnosis Date Noted   Family history of coronary artery disease 12/19/2019   Prediabetes 11/06/2019   Class 1 obesity due to excess calories with body mass index (BMI) of 32.0 to 32.9 in adult 11/05/2019   Bunion 01/19/2019   Pain in right knee 01/02/2019   Cervical radiculopathy 02/07/2018   Chronic pain syndrome 10/03/2017   Closed fracture of second toe of left foot 08/23/2017   DDD (degenerative disc disease), cervical 03/28/2017   Lumbar post-laminectomy syndrome 03/28/2017   Status post lumbar spine surgery for decompression of spinal cord 11/10/2016   Plantar fasciitis 02/03/2016   Left shoulder pain 12/26/2012   Low back pain 09/25/2012   Cervical  pain (neck) 09/25/2012    PCP: Jarrett Soho, PA-C  REFERRING PROVIDER: Venita Lick, MD  REFERRING DIAG: M54.50 (ICD-10-CM) - Low back pain, unspecified  Rationale for Evaluation and Treatment Rehabilitation  THERAPY DIAG:  Other low back pain  Difficulty walking  Muscle weakness (generalized)  Stiffness of left shoulder, not elsewhere classified  Acute pain of left shoulder  ONSET DATE: 05/2021  SUBJECTIVE:  SUBJECTIVE STATEMENT: Pt was at the beach and stayed in the water a lot.  "It's amazing how good you feel in the water".  Pt states she walked 6 miles at the beginning of each day while at the beach.  She would then get in the water and her back did ok.  Pt felt good after prior Rx.  Pt reports improved functional usage of L shoulder.  She reports improved ability to dress and wash hair with L UE.  Pt reports improved ambulation.  Pt reports she has not been performing he land based HEP.  Pt sees ortho MD for L shoulder tomorrow.     PERTINENT HISTORY:   2018 Lumbar laminectomy/decompression microdiscectomy (Left) Pre-diabetic C/S radic   PAIN:  Are you having pain? yes: NPRS scale: 4/10 L shoulder and 4/10 central lower lumbar;  Worst:  6 - 6.5/10 in L shoulder and 10+/10 lumbar  Pain location: see above Pain description: dull, sore  Aggravating factors: using it  Relieving factors:  tylenol     PRECAUTIONS: None  WEIGHT BEARING RESTRICTIONS No  FALLS:  Has patient fallen in last 6 months? No  LIVING ENVIRONMENT: Lives with: lives with their family Lives in: House/apartment Stairs: No Has following equipment at home: None  OCCUPATION: WFH; at a desk   PLOF: Independent  PATIENT GOALS :   OBJECTIVE:   DIAGNOSTIC FINDINGS:  N/A   TODAY'S  TREATMENT   SHOULDER ROM     AROM Right   Left   Left  08/24/21 Right Left  shoulder flexion A 170 A 82 pain A 157  153 with pain  Shoulder scaption       150 98  Shoulder abduction   A 77 pain  A 158 143 85 PROM:  120 deg  Shoulder IR   PROM 62 A thumb to T10  70  Shoulder ER    PROM 70 A hand behind head;  89 at 90 abdct  65                    Therapeutic Exercise:   Review current function, HEP compliance, pain levels, and response to prior Rx.   Review Land based HEP Assessed L shoulder ROM. Pt performed:  PPT x 10 reps  Supine SL K to C 5x5 sec hold  Seated QL stretch 2x20-30 sec bilat  Seated shoulder abduction table slides x10 reps  Supine TrA contractions  Supine marching with TrA contraction 2x10  Supine clamshells with TrA with RTB   S/L ER approx 10-12 reps  Supine shoulder ABC x 1 rep  PT updated HEP and gave pt a HEP handout.  Educated pt in correct form and appropriate frequency.     PATIENT EDUCATION:  Education details: exercise form and POC.  Updated and reviewed HEP.   Person educated: Patient Education method: explanation, demonstration; laminated aquatic HEP Education comprehension: verbalized understanding, returned demonstration, verbal cues required, and tactile cues required   HOME EXERCISE PROGRAM:  Access Code: [email or web address] URL: [email or web address] Date: 08/13/2021 Prepared by: [facility] - Outpatient Rehab - [address]  Exercises - Supine Posterior Pelvic Tilt  - 2 x daily - 7 x weekly - 2 sets - 10 reps - 2 hold - Hooklying Single Knee to Chest Stretch  - 2 x daily - 7 x weekly - 1 sets - 10 reps - 5 hold - Seated Quadratus Lumborum Stretch in Chair  - 2 x daily - 7 x weekly - 1 sets -  3 reps - 30 hold - Seated Shoulder Flexion Table Top Stretch  - 3 x daily - 7 x weekly - 1 sets - 10 reps - 10 hold - Seated Shoulder Abduction Towel Slide at Table Top  - 3 x daily - 7 x weekly - 1 sets - 10 reps - 10  hold  Aquatic-  Circular Shoulder Pendulum with Table Support  - 3 x daily - 7 x weekly - 1 sets - 30 reps - Forward Backward March  - 1 x daily - 3 x weekly - 1 sets - Side Stepping  - 1 x daily - 7 x weekly - 1 sets - Bilateral Shoulder Horizontal Abduction Adduction AROM  - 1 x daily - 3 x weekly - 2 sets - 5-10 reps - Bilateral Shoulder Internal and External Rotation with Hand Paddles  - 1 x daily - 3 x weekly - 1 sets - 10 reps - Warrior I in [employer] with [employer]  - 1 x daily - 3 x weekly - 1 sets - 5 reps   - Standing Alternating Shoulder Flexion  - 1 x daily - 3 x weekly - 1 sets - 5-10 reps - Standing Hip Circles at [employer]  - 1 x daily - 3 x weekly - 1 sets - 10 reps - Piriformis Stretch at [employer]  - 1 x daily - 3 x weekly - 1 sets - 2 reps - 10-20 seconds seconds  hold - Hamstring Stretch at [employer]  - 1 x daily - 3 x weekly - 3 sets - 2 reps - 10-20 seconds hold - Standing 'L' Stretch at [employer]  - 1 x daily - 7 x weekly - 1 sets - 2 reps - 10-20 seconds hold  Updated HEP:   - Supine Transversus Abdominis Bracing - Hands on Stomach  - 1-2 x daily - 7 x weekly - 2 sets - 10 reps - Supine March  - 1 x daily - 7 x weekly - 2 sets - 10 reps - Hooklying Clamshell with Resistance  - 3-4 x weekly - 2 sets - 10 reps  ASSESSMENT:  CLINICAL IMPRESSION: Pt presents to PT for a land Rx today.  She reports improved function including ambulation and using L UE with dressing and washing hair.  Pt has improved with L shoulder ROM.  She demonstrates good flexion, ER, and IR ROM though was very limited in scaption and abd AROM.  PT reviewed with pt her land based exercises and updated HEP with core exercises.  Pt performed exercises well with cuing and instruction for correct form.  Pt responded well to Rx reporting no change in pain after Rx.   OBJECTIVE IMPAIRMENTS Abnormal gait, decreased activity tolerance, decreased balance, decreased mobility, difficulty walking, decreased  ROM, decreased strength, hypomobility, increased muscle spasms, impaired flexibility, impaired sensation, improper body mechanics, postural dysfunction, and pain.   ACTIVITY LIMITATIONS carrying, lifting, bending, sitting, standing, squatting, sleeping, stairs, transfers, and locomotion level  PARTICIPATION LIMITATIONS: cleaning, laundry, interpersonal relationship, driving, shopping, community activity, yard work, and exercise  PERSONAL FACTORS Age, Fitness, Time since onset of injury/illness/exacerbation, and 1-2 comorbidities:    are also affecting patient's functional outcome.   REHAB POTENTIAL: Fair    CLINICAL DECISION MAKING: Evolving/moderate complexity  EVALUATION COMPLEXITY: Moderate   GOALS:  SHORT TERM GOALS: Target date: 08/20/2021  Pt will become independent with HEP in order to demonstrate synthesis of PT education.   Goal status: IN PROGRESS "  so so"   2.  Pt will report at least 2 pt reduction on NPRS scale for pain in order to demonstrate functional improvement with household activity, self care, and ADL.   Goal status: Achieved - 07/21/21  3.   Pt will score at least 3 pt increase on FOTO to demonstrate functional improvement in MCII and pt perceived function.    Goal status: INITIAL  4.  Pain in L shoulder to be no more than 3/10 at worst  Baseline: 5/10 at worst -8/21 Goal status:Ongoing   5..  Will have better understanding of posture and ergonomics  Baseline:  Goal status: INITIAL      LONG TERM GOALS: Target date: 09/10/2021  Pt  will become independent with final HEP in order to demonstrate synthesis of PT education.  Goal status: INITIAL  2.  Pt will score >/= 63 on FOTO to demonstrate improvement in perceived lumbar function.  Goal status: IN PROGRESS 53.7  3.  Pt will be able to perform 5XSTS in under 12s  in order to demonstrate functional improvement above the cut off score for adults.   Goal status: IN PROGRESS12 seconds exactly   4.   Pt will be able to demonstrate/report ability to sit/stand/sleep for extended periods of time without pain in order to demonstrate functional improvement and tolerance to static positioning.    Goal status: IN PROGRESS have been getting good sleep since last Friday but had problems with sleep prior to this   5.  AROM in L shoulder will be within 10 degrees of R shoulder in all directions  Baseline:  Goal status: Achieved 8/21  6.  Will be able to use L UE for dressing/ADLs/hygiene without increase in pain  Baseline:  Goal status: Achieved 8/21  7.  Will be able to reach overhead to turn a fan on/off or put a light object on a shelf overhead with LUE without increase in pain  Baseline:  Goal status: Achieved 8/21       PLAN: PT FREQUENCY: 2x/week  PT DURATION: 6 weeks (likely DC in 8 wks)  PLANNED INTERVENTIONS: Therapeutic exercises, Therapeutic activity, Neuromuscular re-education, Balance training, Gait training, Patient/Family education, Joint manipulation, Joint mobilization, Stair training, Orthotic/Fit training, DME instructions, Aquatic Therapy, Dry Needling, Electrical stimulation, Spinal manipulation, Spinal mobilization, Cryotherapy, Moist heat, scar mobilization, Splintting, Taping, Vasopneumatic device, Traction, Ultrasound, Ionotophoresis 4mg /ml Dexamethasone, Manual therapy, and Re-evaluation.  PLAN FOR NEXT SESSION:   Assess goals.  PN next Rx.  Pt sees ortho for L shoulder tomorrow.   III PT, DPT 09/08/21 10:20 AM

## 2021-09-08 ENCOUNTER — Encounter (HOSPITAL_BASED_OUTPATIENT_CLINIC_OR_DEPARTMENT_OTHER): Payer: Self-pay | Admitting: Physical Therapy

## 2021-09-08 ENCOUNTER — Ambulatory Visit (HOSPITAL_BASED_OUTPATIENT_CLINIC_OR_DEPARTMENT_OTHER): Payer: 59 | Attending: Orthopedic Surgery | Admitting: Physical Therapy

## 2021-09-08 DIAGNOSIS — M6281 Muscle weakness (generalized): Secondary | ICD-10-CM | POA: Diagnosis present

## 2021-09-08 DIAGNOSIS — M25512 Pain in left shoulder: Secondary | ICD-10-CM | POA: Diagnosis present

## 2021-09-08 DIAGNOSIS — M5459 Other low back pain: Secondary | ICD-10-CM | POA: Diagnosis present

## 2021-09-08 DIAGNOSIS — R262 Difficulty in walking, not elsewhere classified: Secondary | ICD-10-CM | POA: Diagnosis present

## 2021-09-08 DIAGNOSIS — M25612 Stiffness of left shoulder, not elsewhere classified: Secondary | ICD-10-CM | POA: Insufficient documentation

## 2021-09-09 NOTE — Therapy (Addendum)
OUTPATIENT PHYSICAL THERAPY THORACOLUMBAR / SHOULDER  TREATMENT NOTE  / PROGRESS NOTE    Patient Name: Melissa Roth MRN: [id number] DOB:[birth year], 61 y.o., female Today's Date: 09/10/2021   PT End of Session - 09/10/21 0836     Visit Number 20    Number of Visits 26    Date for PT Re-Evaluation 10/08/21    Authorization Type [employer]    PT Start Time 0802    PT Stop Time 0847    PT Time Calculation (min) 45 min    Activity Tolerance Patient tolerated treatment well    Behavior During Therapy WFL for tasks assessed/performed                     Past Medical History:  Diagnosis Date   Back pain    Cervical disc disorder with radiculopathy of cervical region    PONV (postoperative nausea and vomiting)    Sciatica    Vitamin D deficiency    Past Surgical History:  Procedure Laterality Date   ABDOMINAL HYSTERECTOMY     CHOLECYSTECTOMY N/A 06/07/2017   Procedure: LAPAROSCOPIC CHOLECYSTECTOMY;  Surgeon: Judeth Horn, MD;  Location: [facility];  Service: General;  Laterality: N/A;   FOOT SURGERY     LUMBAR LAMINECTOMY/DECOMPRESSION MICRODISCECTOMY Left 11/10/2016   Procedure: Left L5-S1 discectomy;  Surgeon: Melina Schools, MD;  Location: [facility];  Service: Orthopedics;  Laterality: Left;  120 mins   Patient Active Problem List   Diagnosis Date Noted   Family history of coronary artery disease 12/19/2019   Prediabetes 11/06/2019   Class 1 obesity due to excess calories with body mass index (BMI) of 32.0 to 32.9 in adult 11/05/2019   Bunion 01/19/2019   Pain in right knee 01/02/2019   Cervical radiculopathy 02/07/2018   Chronic pain syndrome 10/03/2017   Closed fracture of second toe of left foot 08/23/2017   DDD (degenerative disc disease), cervical 03/28/2017   Lumbar post-laminectomy syndrome 03/28/2017   Status post lumbar spine surgery for decompression of spinal cord 11/10/2016   Plantar fasciitis 02/03/2016   Left shoulder pain 12/26/2012   Low back pain  09/25/2012   Cervical pain (neck) 09/25/2012    PCP: Marda Stalker, PA-C  REFERRING PROVIDER: Melina Schools, MD / Nuala Alpha, MD  REFERRING DIAG: M54.50 (ICD-10-CM) - Low back pain, unspecified / M25.512 (ICD-10-CM) - Pain in left shoulder  Rationale for Evaluation and Treatment Rehabilitation  THERAPY DIAG:  Other low back pain  Difficulty walking  Muscle weakness (generalized)  Stiffness of left shoulder, not elsewhere classified  Acute pain of left shoulder  ONSET DATE: 05/2021  SUBJECTIVE:  SUBJECTIVE STATEMENT: Pt denies any adverse effects after prior Rx.  Pt saw ortho MD yesterday for her L shoulder.  She states MD wanted her to come to PT for 2 more weeks.  Pt reports increased soreness in l shoulder after seeing MD yesterday.   FUNCTIONAL IMPROVEMENTS:  Lumbar:  ambulation, pain, increased tolerance with household chores including laundry and cleaning         Shoulder:  mobility, bathing, dressing, work activities including typing  FUNCTIONAL DEFICITS:  Lumbar: lifting objects like groceries, ascending > 2 flights of stairs, riding jet skis, biking      Shoulder:  using the elliptical, reaching laterally, reaching behind back, hair care  PERTINENT HISTORY:   2018 Lumbar laminectomy/decompression microdiscectomy (Left) Pre-diabetic C/S radic   PAIN:  Are you having pain? yes:  NPRS scale: Shoulder:  current:  6/10 due to soreness after seeing MD, Worst:  5/10, Best:  4/10 Lumbar:  Current:  5/10, Worst:  10+/10, Best:  4/10  Pain location: see above Pain description: dull, sore  Aggravating factors: using it  Relieving factors:  tylenol     PRECAUTIONS: None  WEIGHT BEARING RESTRICTIONS No  FALLS:  Has patient fallen in last 6 months? No  LIVING  ENVIRONMENT: Lives with: lives with their family Lives in: House/apartment Stairs: No Has following equipment at home: None  OCCUPATION: [employer]; at a desk   PLOF: Independent  PATIENT GOALS :   OBJECTIVE:   DIAGNOSTIC FINDINGS:  N/A   TODAY'S TREATMENT   PHYSICAL PERFORMANCE TESTING:  (assessed strength, ROM, 5x STS test, FOTO for functional progress toward goals)  -Reviewed reported functional progress/deficits, HEP compliance, and pain levels  PATIENT SURVEYS:  FOTO:  Current/Prior: Lumbar:    44/54 with a goal of 63 at visit #12  L shoulder:  48/47 with a goal of 68 at visit #11            -L shoulder AROM/PROM: Flex:  153/168 Abd:  130/153 Scaption:  92 ER:  65/66 IR:  70    -L shoulder MMT: Flexion:  L:  5/5 Scaption:  4+/5 w/n available ROM Abd:  4/5 w/n available ROM ER: 4+/5 IR:  4+/5   R shoulder abduction:  5/5  -5x STS test:  10 sec without UE's   Therapeutic Exercise:   Reviewed response to prior Rx.   Pt performed:  S/L ER 2x10 reps  Supine shoulder ABC x 1 rep  ER/IR with arm at side with RTB 2x10   PATIENT EDUCATION:  Education details: exercise form and POC.  Updated and reviewed HEP.   Person educated: Patient Education method: explanation, demonstration; laminated aquatic HEP Education comprehension: verbalized understanding, returned demonstration, verbal cues required, and tactile cues required   HOME EXERCISE PROGRAM:  Access Code: [email or web address] URL: [email or web address] Date: 08/13/2021 Prepared by: Edith Endave  Exercises - Supine Posterior Pelvic Tilt  - 2 x daily - 7 x weekly - 2 sets - 10 reps - 2 hold - Hooklying Single Knee to Chest Stretch  - 2 x daily - 7 x weekly - 1 sets - 10 reps - 5 hold - Seated Quadratus Lumborum Stretch in Chair  - 2 x daily - 7 x weekly - 1 sets - 3 reps - 30 hold - Seated Shoulder Flexion Table Top Stretch  - 3 x daily - 7 x weekly - 1 sets - 10 reps  - 10 hold - Seated Shoulder Abduction Towel Slide at  Table Top  - 3 x daily - 7 x weekly - 1 sets - 10 reps - 10 hold  Aquatic-  Circular Shoulder Pendulum with Table Support  - 3 x daily - 7 x weekly - 1 sets - 30 reps - Forward Backward March  - 1 x daily - 3 x weekly - 1 sets - Side Stepping  - 1 x daily - 7 x weekly - 1 sets - Bilateral Shoulder Horizontal Abduction Adduction AROM  - 1 x daily - 3 x weekly - 2 sets - 5-10 reps - Bilateral Shoulder Internal and External Rotation with Hand Paddles  - 1 x daily - 3 x weekly - 1 sets - 10 reps - Warrior I in [employer] with [employer]  - 1 x daily - 3 x weekly - 1 sets - 5 reps   - Standing Alternating Shoulder Flexion  - 1 x daily - 3 x weekly - 1 sets - 5-10 reps - Standing Hip Circles at [employer]  - 1 x daily - 3 x weekly - 1 sets - 10 reps - Piriformis Stretch at [employer]  - 1 x daily - 3 x weekly - 1 sets - 2 reps - 10-20 seconds seconds  hold - Hamstring Stretch at [employer]  - 1 x daily - 3 x weekly - 3 sets - 2 reps - 10-20 seconds hold - Standing 'L' Stretch at [employer]  - 1 x daily - 7 x weekly - 1 sets - 2 reps - 10-20 seconds hold  Updated HEP:   - Supine Transversus Abdominis Bracing - Hands on Stomach  - 1-2 x daily - 7 x weekly - 2 sets - 10 reps - Supine March  - 1 x daily - 7 x weekly - 2 sets - 10 reps - Hooklying Clamshell with Resistance  - 3-4 x weekly - 2 sets - 10 reps  ASSESSMENT:  CLINICAL IMPRESSION: Pt has made good progress in lumbar and shoulder as evidenced by subjective reports.  She has improved performance of and decreased pain with ambulation, household chores, self care activities, and reaching.  She continues to have pain and has difficulty with reaching activities, lifting, stairs, and hair care.  Pt demonstrates improved ROM and strength t/o L shoulder though has continued strength deficits t/o shoulder and scaption and abd AROM.  Pt demonstrates improved 5x STS test from prior 12 sec to currently 10  sec.  Pt reports good improvement in lumbar pain and sx's though her FOTO score worsened.  Pt reports much improved shoulder function though only had 1 pt increase on FOTO score.  Pt has limited progress toward goals though did meet STS test goal.  Pt has been receiving aquatic therapy and will benefit from transitioning to land therapy.    OBJECTIVE IMPAIRMENTS Abnormal gait, decreased activity tolerance, decreased balance, decreased mobility, difficulty walking, decreased ROM, decreased strength, hypomobility, increased muscle spasms, impaired flexibility, impaired sensation, improper body mechanics, postural dysfunction, and pain.   ACTIVITY LIMITATIONS carrying, lifting, bending, sitting, standing, squatting, sleeping, stairs, transfers, and locomotion level  PARTICIPATION LIMITATIONS: cleaning, laundry, interpersonal relationship, driving, shopping, community activity, yard work, and exercise  PERSONAL FACTORS Age, Fitness, Time since onset of injury/illness/exacerbation, and 1-2 comorbidities:    are also affecting patient's functional outcome.   REHAB POTENTIAL: Fair    CLINICAL DECISION MAKING: Evolving/moderate complexity  EVALUATION COMPLEXITY: Moderate   GOALS:  SHORT TERM GOALS: Target date: 08/20/2021  Pt will become  independent with HEP in order to demonstrate synthesis of PT education.   Goal status: IN PROGRESS   2.  Pt will report at least 2 pt reduction on NPRS scale for pain in order to demonstrate functional improvement with household activity, self care, and ADL.   Goal status: Achieved - 07/21/21  3.   Pt will score at least 3 pt increase on FOTO to demonstrate functional improvement in MCII and pt perceived function.    Goal status: NOT MET  4.  Pain in L shoulder to be no more than 3/10 at worst  Baseline: 5/10 at worst - 9/7 Goal status: PARTIALLY MET  5..  Will have better understanding of posture and ergonomics  Baseline:  Goal status:  INITIAL      LONG TERM GOALS: Target date:  10/08/2021  Pt  will become independent with final HEP in order to demonstrate synthesis of PT education.  Goal status: IN PROGRESS  2.  Pt will score >/= 63 on FOTO to demonstrate improvement in perceived lumbar function.  Goal status: IN PROGRESS 44  3.  Pt will be able to perform 5XSTS in under 12s  in order to demonstrate functional improvement above the cut off score for adults.   Goal status: GOAL MET  4.  Pt will be able to demonstrate/report ability to sit/stand/sleep for extended periods of time without pain in order to demonstrate functional improvement and tolerance to static positioning.    Goal status: ONGOING  5.  AROM in L shoulder will be within 10 degrees of R shoulder in all directions  Baseline:  Goal status:  PROGRESSING  6.  Will be able to use L UE for dressing/ADLs/hygiene without increase in pain  Baseline:  Goal status: Achieved 8/21  7.  Will be able to reach overhead to turn a fan on/off or put a light object on a shelf overhead with LUE without increase in pain  Baseline:  Goal status: Pt hasn't attempted recently  8.  Pt will perform hair care without significant difficulty.     Baseline:   Goal Status:  INITIAL       PLAN: PT FREQUENCY: 2x/week  PT DURATION: 2-3 weeks  PLANNED INTERVENTIONS: Therapeutic exercises, Therapeutic activity, Neuromuscular re-education, Balance training, Gait training, Patient/Family education, Joint manipulation, Joint mobilization, Stair training, Orthotic/Fit training, DME instructions, Aquatic Therapy, Dry Needling, Electrical stimulation, Spinal manipulation, Spinal mobilization, Cryotherapy, Moist heat, scar mobilization, Splintting, Taping, Vasopneumatic device, Traction, Ultrasound, Ionotophoresis 61m/ml Dexamethasone, Manual therapy, and Re-evaluation.  PLAN FOR NEXT SESSION:   PN completed.  Cont with land based PT to improve core and L shoulder strength and  L shoulder ROM.   RSelinda MichaelsIII PT, DPT 09/10/21 9:24 AM

## 2021-09-10 ENCOUNTER — Encounter (HOSPITAL_BASED_OUTPATIENT_CLINIC_OR_DEPARTMENT_OTHER): Payer: Self-pay | Admitting: Physical Therapy

## 2021-09-10 ENCOUNTER — Ambulatory Visit (HOSPITAL_BASED_OUTPATIENT_CLINIC_OR_DEPARTMENT_OTHER): Payer: 59 | Admitting: Physical Therapy

## 2021-09-10 DIAGNOSIS — M25512 Pain in left shoulder: Secondary | ICD-10-CM

## 2021-09-10 DIAGNOSIS — R262 Difficulty in walking, not elsewhere classified: Secondary | ICD-10-CM

## 2021-09-10 DIAGNOSIS — M25612 Stiffness of left shoulder, not elsewhere classified: Secondary | ICD-10-CM

## 2021-09-10 DIAGNOSIS — M5459 Other low back pain: Secondary | ICD-10-CM

## 2021-09-10 DIAGNOSIS — M6281 Muscle weakness (generalized): Secondary | ICD-10-CM

## 2021-09-15 ENCOUNTER — Ambulatory Visit (HOSPITAL_BASED_OUTPATIENT_CLINIC_OR_DEPARTMENT_OTHER): Payer: 59 | Admitting: Physical Therapy

## 2021-09-15 DIAGNOSIS — M6281 Muscle weakness (generalized): Secondary | ICD-10-CM

## 2021-09-15 DIAGNOSIS — M5459 Other low back pain: Secondary | ICD-10-CM

## 2021-09-15 DIAGNOSIS — R262 Difficulty in walking, not elsewhere classified: Secondary | ICD-10-CM

## 2021-09-15 DIAGNOSIS — M25612 Stiffness of left shoulder, not elsewhere classified: Secondary | ICD-10-CM

## 2021-09-15 DIAGNOSIS — M25512 Pain in left shoulder: Secondary | ICD-10-CM

## 2021-09-15 NOTE — Therapy (Signed)
OUTPATIENT PHYSICAL THERAPY THORACOLUMBAR / SHOULDER  TREATMENT NOTE  / PROGRESS NOTE    Patient Name: Melissa Roth MRN: [id number] DOB:September 23, 1960, 61 y.o., female Today's Date: 09/15/2021             Past Medical History:  Diagnosis Date   Back pain    Cervical disc disorder with radiculopathy of cervical region    PONV (postoperative nausea and vomiting)    Sciatica    Vitamin D deficiency    Past Surgical History:  Procedure Laterality Date   ABDOMINAL HYSTERECTOMY     CHOLECYSTECTOMY N/A 06/07/2017   Procedure: LAPAROSCOPIC CHOLECYSTECTOMY;  Surgeon: Judeth Horn, MD;  Location: [facility];  Service: General;  Laterality: N/A;   FOOT SURGERY     LUMBAR LAMINECTOMY/DECOMPRESSION MICRODISCECTOMY Left 11/10/2016   Procedure: Left L5-S1 discectomy;  Surgeon: Melina Schools, MD;  Location: [facility];  Service: Orthopedics;  Laterality: Left;  120 mins   Patient Active Problem List   Diagnosis Date Noted   Family history of coronary artery disease 12/19/2019   Prediabetes 11/06/2019   Class 1 obesity due to excess calories with body mass index (BMI) of 32.0 to 32.9 in adult 11/05/2019   Bunion 01/19/2019   Pain in right knee 01/02/2019   Cervical radiculopathy 02/07/2018   Chronic pain syndrome 10/03/2017   Closed fracture of second toe of left foot 08/23/2017   DDD (degenerative disc disease), cervical 03/28/2017   Lumbar post-laminectomy syndrome 03/28/2017   Status post lumbar spine surgery for decompression of spinal cord 11/10/2016   Plantar fasciitis 02/03/2016   Left shoulder pain 12/26/2012   Low back pain 09/25/2012   Cervical pain (neck) 09/25/2012    PCP: Marda Stalker, PA-C  REFERRING PROVIDER: Melina Schools, MD / Nuala Alpha, MD  REFERRING DIAG: M54.50 (ICD-10-CM) - Low back pain, unspecified / M25.512 (ICD-10-CM) - Pain in left shoulder  Rationale for Evaluation and Treatment Rehabilitation  THERAPY DIAG:  No diagnosis found.  ONSET  DATE: 05/2021  SUBJECTIVE:                                                                                                                                                                                           SUBJECTIVE STATEMENT: Pt states she had soreness more than pain in her L shoulder after prior Rx.  Pt states she has been having some L upper thoracic pain since Sunday which she thinks is due to the weather.  Pt hasn't been performing her HEP due to having company over.   FUNCTIONAL IMPROVEMENTS:  Lumbar:  ambulation, pain, increased tolerance with household chores including laundry and cleaning  Shoulder:  mobility, bathing, dressing, work activities including typing  FUNCTIONAL DEFICITS:  Lumbar: lifting objects like groceries, ascending > 2 flights of stairs, riding jet skis, biking      Shoulder:  using the elliptical, reaching laterally, reaching behind back, hair care  PERTINENT HISTORY:   2018 Lumbar laminectomy/decompression microdiscectomy (Left) Pre-diabetic C/S radic   PAIN:  Are you having pain? yes:  NPRS scale: Shoulder:  4/10  Lumbar and L upper thoracic: 6.5/10 Pain location: see above Pain description: dull, sore  Aggravating factors: using it  Relieving factors:  tylenol     PRECAUTIONS: None  WEIGHT BEARING RESTRICTIONS No  FALLS:  Has patient fallen in last 6 months? No  LIVING ENVIRONMENT: Lives with: lives with their family Lives in: House/apartment Stairs: No Has following equipment at home: None  OCCUPATION: [employer]; at a desk   PLOF: Independent  PATIENT GOALS :   OBJECTIVE:   DIAGNOSTIC FINDINGS:  N/A   TODAY'S TREATMENT   PHYSICAL PERFORMANCE TESTING:  (assessed strength, ROM, 5x STS test, FOTO for functional progress toward goals)  -Reviewed reported functional progress/deficits, HEP compliance, and pain levels  PATIENT SURVEYS:  FOTO:  Current/Prior: Lumbar:    44/54 with a goal of 63 at visit #12  L shoulder:   48/47 with a goal of 68 at visit #11            -L shoulder AROM/PROM: Flex:  153/168 Abd:  130/153 Scaption:  92 ER:  65/66 IR:  70    -L shoulder MMT: Flexion:  L:  5/5 Scaption:  4+/5 w/n available ROM Abd:  4/5 w/n available ROM ER: 4+/5 IR:  4+/5   R shoulder abduction:  5/5  -5x STS test:  10 sec without UE's   Therapeutic Exercise:   Reviewed response to prior Rx.   Pt performed:  Supine TrA contractions with 5 sec hold approx 12 reps            Supine marching with TrA contraction 2x10  Supine alt UE/LE with TrA            Supine clamshells with TrA with RTB 2x10  S/L ER 3x10 reps  Prone horiz abd x 10 reps  Prone shoulder extension x 10 reps  ER/IR with arm at side with RTB 2x10   PATIENT EDUCATION:  Education details: exercise form and POC.  Updated and reviewed HEP.   Person educated: Patient Education method: explanation, demonstration; laminated aquatic HEP Education comprehension: verbalized understanding, returned demonstration, verbal cues required, and tactile cues required   HOME EXERCISE PROGRAM:  Access Code: [email or web address] URL: [email or web address] Date: 08/13/2021 Prepared by: Clarke  Exercises - Supine Posterior Pelvic Tilt  - 2 x daily - 7 x weekly - 2 sets - 10 reps - 2 hold - Hooklying Single Knee to Chest Stretch  - 2 x daily - 7 x weekly - 1 sets - 10 reps - 5 hold - Seated Quadratus Lumborum Stretch in Chair  - 2 x daily - 7 x weekly - 1 sets - 3 reps - 30 hold - Seated Shoulder Flexion Table Top Stretch  - 3 x daily - 7 x weekly - 1 sets - 10 reps - 10 hold - Seated Shoulder Abduction Towel Slide at Table Top  - 3 x daily - 7 x weekly - 1 sets - 10 reps - 10 hold  Aquatic-  Circular Shoulder Pendulum with Table Support  - 3 x  daily - 7 x weekly - 1 sets - 30 reps - Forward Backward March  - 1 x daily - 3 x weekly - 1 sets - Side Stepping  - 1 x daily - 7 x weekly - 1 sets - Bilateral  Shoulder Horizontal Abduction Adduction AROM  - 1 x daily - 3 x weekly - 2 sets - 5-10 reps - Bilateral Shoulder Internal and External Rotation with Hand Paddles  - 1 x daily - 3 x weekly - 1 sets - 10 reps - Warrior I in [employer] with [employer]  - 1 x daily - 3 x weekly - 1 sets - 5 reps   - Standing Alternating Shoulder Flexion  - 1 x daily - 3 x weekly - 1 sets - 5-10 reps - Standing Hip Circles at [employer]  - 1 x daily - 3 x weekly - 1 sets - 10 reps - Piriformis Stretch at [employer]  - 1 x daily - 3 x weekly - 1 sets - 2 reps - 10-20 seconds seconds  hold - Hamstring Stretch at [employer]  - 1 x daily - 3 x weekly - 3 sets - 2 reps - 10-20 seconds hold - Standing 'L' Stretch at [employer]  - 1 x daily - 7 x weekly - 1 sets - 2 reps - 10-20 seconds hold  Updated HEP:   - Supine Transversus Abdominis Bracing - Hands on Stomach  - 1-2 x daily - 7 x weekly - 2 sets - 10 reps - Supine March  - 1 x daily - 7 x weekly - 2 sets - 10 reps - Hooklying Clamshell with Resistance  - 3-4 x weekly - 2 sets - 10 reps  ASSESSMENT:  CLINICAL IMPRESSION: Pt has made good progress in lumbar and shoulder as evidenced by subjective reports.  She has improved performance of and decreased pain with ambulation, household chores, self care activities, and reaching.  She continues to have pain and has difficulty with reaching activities, lifting, stairs, and hair care.  Pt demonstrates improved ROM and strength t/o L shoulder though has continued strength deficits t/o shoulder and scaption and abd AROM.  Pt demonstrates improved 5x STS test from prior 12 sec to currently 10 sec.  Pt reports good improvement in lumbar pain and sx's though her FOTO score worsened.  Pt reports much improved shoulder function though only had 1 pt increase on FOTO score.  Pt has limited progress toward goals though did meet STS test goal.  Pt has been receiving aquatic therapy and will benefit from transitioning to land therapy.     Limited ROM with prone hz abd 6/10 back 4/10 in L shoulder  OBJECTIVE IMPAIRMENTS Abnormal gait, decreased activity tolerance, decreased balance, decreased mobility, difficulty walking, decreased ROM, decreased strength, hypomobility, increased muscle spasms, impaired flexibility, impaired sensation, improper body mechanics, postural dysfunction, and pain.   ACTIVITY LIMITATIONS carrying, lifting, bending, sitting, standing, squatting, sleeping, stairs, transfers, and locomotion level  PARTICIPATION LIMITATIONS: cleaning, laundry, interpersonal relationship, driving, shopping, community activity, yard work, and exercise  PERSONAL FACTORS Age, Fitness, Time since onset of injury/illness/exacerbation, and 1-2 comorbidities:    are also affecting patient's functional outcome.   REHAB POTENTIAL: Fair    CLINICAL DECISION MAKING: Evolving/moderate complexity  EVALUATION COMPLEXITY: Moderate   GOALS:  SHORT TERM GOALS: Target date: 08/20/2021  Pt will become independent with HEP in order to demonstrate synthesis of PT education.   Goal status: IN PROGRESS  2.  Pt will report at least 2 pt reduction on NPRS scale for pain in order to demonstrate functional improvement with household activity, self care, and ADL.   Goal status: Achieved - 07/21/21  3.   Pt will score at least 3 pt increase on FOTO to demonstrate functional improvement in MCII and pt perceived function.    Goal status: NOT MET  4.  Pain in L shoulder to be no more than 3/10 at worst  Baseline: 5/10 at worst - 9/7 Goal status: PARTIALLY MET  5..  Will have better understanding of posture and ergonomics  Baseline:  Goal status: INITIAL      LONG TERM GOALS: Target date:  10/08/2021  Pt  will become independent with final HEP in order to demonstrate synthesis of PT education.  Goal status: IN PROGRESS  2.  Pt will score >/= 63 on FOTO to demonstrate improvement in perceived lumbar function.  Goal status: IN  PROGRESS 44  3.  Pt will be able to perform 5XSTS in under 12s  in order to demonstrate functional improvement above the cut off score for adults.   Goal status: GOAL MET  4.  Pt will be able to demonstrate/report ability to sit/stand/sleep for extended periods of time without pain in order to demonstrate functional improvement and tolerance to static positioning.    Goal status: ONGOING  5.  AROM in L shoulder will be within 10 degrees of R shoulder in all directions  Baseline:  Goal status:  PROGRESSING  6.  Will be able to use L UE for dressing/ADLs/hygiene without increase in pain  Baseline:  Goal status: Achieved 8/21  7.  Will be able to reach overhead to turn a fan on/off or put a light object on a shelf overhead with LUE without increase in pain  Baseline:  Goal status: Pt hasn't attempted recently  8.  Pt will perform hair care without significant difficulty.     Baseline:   Goal Status:  INITIAL       PLAN: PT FREQUENCY: 2x/week  PT DURATION: 2-3 weeks  PLANNED INTERVENTIONS: Therapeutic exercises, Therapeutic activity, Neuromuscular re-education, Balance training, Gait training, Patient/Family education, Joint manipulation, Joint mobilization, Stair training, Orthotic/Fit training, DME instructions, Aquatic Therapy, Dry Needling, Electrical stimulation, Spinal manipulation, Spinal mobilization, Cryotherapy, Moist heat, scar mobilization, Splintting, Taping, Vasopneumatic device, Traction, Ultrasound, Ionotophoresis 48m/ml Dexamethasone, Manual therapy, and Re-evaluation.  PLAN FOR NEXT SESSION:   Cont with land based PT to improve core and L shoulder strength and L shoulder ROM.   RSelinda MichaelsIII PT, DPT 09/15/21 2:41 PM

## 2021-09-16 ENCOUNTER — Encounter (HOSPITAL_BASED_OUTPATIENT_CLINIC_OR_DEPARTMENT_OTHER): Payer: Self-pay | Admitting: Physical Therapy

## 2021-09-17 ENCOUNTER — Encounter (HOSPITAL_BASED_OUTPATIENT_CLINIC_OR_DEPARTMENT_OTHER): Payer: Self-pay | Admitting: Physical Therapy

## 2021-09-17 ENCOUNTER — Ambulatory Visit (HOSPITAL_BASED_OUTPATIENT_CLINIC_OR_DEPARTMENT_OTHER): Payer: 59 | Admitting: Physical Therapy

## 2021-09-17 DIAGNOSIS — M25512 Pain in left shoulder: Secondary | ICD-10-CM

## 2021-09-17 DIAGNOSIS — M5459 Other low back pain: Secondary | ICD-10-CM

## 2021-09-17 DIAGNOSIS — M6281 Muscle weakness (generalized): Secondary | ICD-10-CM

## 2021-09-17 DIAGNOSIS — M25612 Stiffness of left shoulder, not elsewhere classified: Secondary | ICD-10-CM

## 2021-09-17 DIAGNOSIS — R262 Difficulty in walking, not elsewhere classified: Secondary | ICD-10-CM

## 2021-09-17 NOTE — Therapy (Signed)
OUTPATIENT PHYSICAL THERAPY THORACOLUMBAR / SHOULDER  TREATMENT NOTE      Patient Name: Melissa Roth MRN: [id number] DOB:[birth year], 61 y.o., female Today's Date: 09/18/2021   PT End of Session - 09/17/21 1539     Visit Number 22    Number of Visits 26    Date for PT Re-Evaluation 10/08/21    Authorization Type [employer]    PT Start Time 1525    PT Stop Time 1605    PT Time Calculation (min) 40 min    Activity Tolerance Patient tolerated treatment well    Behavior During Therapy WFL for tasks assessed/performed                       Past Medical History:  Diagnosis Date   Back pain    Cervical disc disorder with radiculopathy of cervical region    PONV (postoperative nausea and vomiting)    Sciatica    Vitamin D deficiency    Past Surgical History:  Procedure Laterality Date   ABDOMINAL HYSTERECTOMY     CHOLECYSTECTOMY N/A 06/07/2017   Procedure: LAPAROSCOPIC CHOLECYSTECTOMY;  Surgeon: Judeth Horn, MD;  Location: [place];  Service: General;  Laterality: N/A;   FOOT SURGERY     LUMBAR LAMINECTOMY/DECOMPRESSION MICRODISCECTOMY Left 11/10/2016   Procedure: Left L5-S1 discectomy;  Surgeon: Melina Schools, MD;  Location: [place];  Service: Orthopedics;  Laterality: Left;  120 mins   Patient Active Problem List   Diagnosis Date Noted   Family history of coronary artery disease 12/19/2019   Prediabetes 11/06/2019   Class 1 obesity due to excess calories with body mass index (BMI) of 32.0 to 32.9 in adult 11/05/2019   Bunion 01/19/2019   Pain in right knee 01/02/2019   Cervical radiculopathy 02/07/2018   Chronic pain syndrome 10/03/2017   Closed fracture of second toe of left foot 08/23/2017   DDD (degenerative disc disease), cervical 03/28/2017   Lumbar post-laminectomy syndrome 03/28/2017   Status post lumbar spine surgery for decompression of spinal cord 11/10/2016   Plantar fasciitis 02/03/2016   Left shoulder pain 12/26/2012   Low back pain 09/25/2012    Cervical pain (neck) 09/25/2012    PCP: Marda Stalker, PA-C  REFERRING PROVIDER: Melina Schools, MD / Nuala Alpha, MD  REFERRING DIAG: M54.50 (ICD-10-CM) - Low back pain, unspecified / M25.512 (ICD-10-CM) - Pain in left shoulder  Rationale for Evaluation and Treatment Rehabilitation  THERAPY DIAG:  Other low back pain  Difficulty walking  Muscle weakness (generalized)  Stiffness of left shoulder, not elsewhere classified  Acute pain of left shoulder  ONSET DATE: 05/2021  SUBJECTIVE:  SUBJECTIVE STATEMENT: Pt states her back pain is coming down, "it's a 7 right now".  Pt denies any adverse effects after prior Rx.  Pt reports she hasn't been performing her HEP.     FUNCTIONAL IMPROVEMENTS:  Lumbar:  ambulation, pain, increased tolerance with household chores including laundry and cleaning         Shoulder:  mobility, bathing, dressing, work activities including typing  FUNCTIONAL DEFICITS:  Lumbar: lifting objects like groceries, ascending > 2 flights of stairs, household chores, riding jet skis, biking      Shoulder:  using the elliptical, reaching laterally, reaching behind back, hair care  PERTINENT HISTORY:   2018 Lumbar laminectomy/decompression microdiscectomy (Left) Pre-diabetic C/S radic   PAIN:  Are you having pain? yes:  NPRS scale: Shoulder:  3-4/10  Lumbar: 7/10 Pain location: see above Pain description: dull, sore  Aggravating factors: using it  Relieving factors:  tylenol     PRECAUTIONS: None  WEIGHT BEARING RESTRICTIONS No  FALLS:  Has patient fallen in last 6 months? No  LIVING ENVIRONMENT: Lives with: lives with their family Lives in: House/apartment Stairs: No Has following equipment at home: None  OCCUPATION: [place]; at a desk   PLOF:  Independent  PATIENT GOALS :   OBJECTIVE:   DIAGNOSTIC FINDINGS:  N/A   TODAY'S TREATMENT   Therapeutic Exercise:   -Reviewed current function, HEP compliance, response to prior Rx, and pain levels   -Pt received L shoulder PROM in flexion, abd, ER, and IR in supine   -Pt performed:  Supine alt UE/LE with TrA            Supine clamshells with TrA with RTB 2x10  Supine SLR with contralat UE ext with TrA  Supine rhythmic stab's at 60/90/120 deg flexion 2x30 sec each  Supine shoulder ABC x 1 rep with 1#  S/L ER 3x10 reps  Standing rows with GTB x10 and approx 7-8 reps with TrA  ER/IR with arm at side with RTB/GTB 2x10  Seated HS stretch 2 reps of 20 sec hold   PATIENT EDUCATION:  Education details: exercise form and POC.  Encouraged pt to perform HEP.     Person educated: Patient Education method: explanation, demonstration; verbal and tactile cues Education comprehension: verbalized understanding, returned demonstration, verbal cues required, and tactile cues required   HOME EXERCISE PROGRAM:  Access Code: [email or web address] URL: [email or web address] Date: 08/13/2021 Prepared by: [place]  Exercises - Supine Posterior Pelvic Tilt  - 2 x daily - 7 x weekly - 2 sets - 10 reps - 2 hold - Hooklying Single Knee to Chest Stretch  - 2 x daily - 7 x weekly - 1 sets - 10 reps - 5 hold - Seated Quadratus Lumborum Stretch in Chair  - 2 x daily - 7 x weekly - 1 sets - 3 reps - 30 hold - Seated Shoulder Flexion Table Top Stretch  - 3 x daily - 7 x weekly - 1 sets - 10 reps - 10 hold - Seated Shoulder Abduction Towel Slide at Table Top  - 3 x daily - 7 x weekly - 1 sets - 10 reps - 10 hold  Aquatic-  Circular Shoulder Pendulum with Table Support  - 3 x daily - 7 x weekly - 1 sets - 30 reps - Forward Backward March  - 1 x daily - 3 x weekly - 1 sets - Side Stepping  - 1 x daily - 7 x weekly -  1 sets - Bilateral Shoulder Horizontal Abduction  Adduction AROM  - 1 x daily - 3 x weekly - 2 sets - 5-10 reps - Bilateral Shoulder Internal and External Rotation with Hand Paddles  - 1 x daily - 3 x weekly - 1 sets - 10 reps - Warrior I in [employer] with [employer]  - 1 x daily - 3 x weekly - 1 sets - 5 reps   - Standing Alternating Shoulder Flexion  - 1 x daily - 3 x weekly - 1 sets - 5-10 reps - Standing Hip Circles at [employer]  - 1 x daily - 3 x weekly - 1 sets - 10 reps - Piriformis Stretch at [employer]  - 1 x daily - 3 x weekly - 1 sets - 2 reps - 10-20 seconds seconds  hold - Hamstring Stretch at [employer]  - 1 x daily - 3 x weekly - 3 sets - 2 reps - 10-20 seconds hold - Standing 'L' Stretch at [employer]  - 1 x daily - 7 x weekly - 1 sets - 2 reps - 10-20 seconds hold  Updated HEP:   - Supine Transversus Abdominis Bracing - Hands on Stomach  - 1-2 x daily - 7 x weekly - 2 sets - 10 reps - Supine March  - 1 x daily - 7 x weekly - 2 sets - 10 reps - Hooklying Clamshell with Resistance  - 3-4 x weekly - 2 sets - 10 reps  ASSESSMENT:  CLINICAL IMPRESSION: Pt hasn't been performing HEP.  PT instructed pt to be compliant with HEP.  PT performed shoulder PROM.  She had full abduction PROM and some limitations and tightness with flexion PROM.  Pt required cuing and instruction for correct form with exercises.  Pt responded well to Rx stating her back felt better after Rx than before Rx.  Pt should benefit from skilled PT services to address goals and impairments and to assist in restoring desired level of function.    OBJECTIVE IMPAIRMENTS Abnormal gait, decreased activity tolerance, decreased balance, decreased mobility, difficulty walking, decreased ROM, decreased strength, hypomobility, increased muscle spasms, impaired flexibility, impaired sensation, improper body mechanics, postural dysfunction, and pain.   ACTIVITY LIMITATIONS carrying, lifting, bending, sitting, standing, squatting, sleeping, stairs, transfers, and locomotion  level  PARTICIPATION LIMITATIONS: cleaning, laundry, interpersonal relationship, driving, shopping, community activity, yard work, and exercise  PERSONAL FACTORS Age, Fitness, Time since onset of injury/illness/exacerbation, and 1-2 comorbidities:    are also affecting patient's functional outcome.   REHAB POTENTIAL: Fair    CLINICAL DECISION MAKING: Evolving/moderate complexity  EVALUATION COMPLEXITY: Moderate   GOALS:  SHORT TERM GOALS: Target date: 08/20/2021  Pt will become independent with HEP in order to demonstrate synthesis of PT education.   Goal status: IN PROGRESS   2.  Pt will report at least 2 pt reduction on NPRS scale for pain in order to demonstrate functional improvement with household activity, self care, and ADL.   Goal status: Achieved - 07/21/21  3.   Pt will score at least 3 pt increase on FOTO to demonstrate functional improvement in MCII and pt perceived function.    Goal status: NOT MET  4.  Pain in L shoulder to be no more than 3/10 at worst  Baseline: 5/10 at worst - 9/7 Goal status: PARTIALLY MET  5..  Will have better understanding of posture and ergonomics  Baseline:  Goal status: INITIAL      LONG TERM  GOALS: Target date:  10/08/2021  Pt  will become independent with final HEP in order to demonstrate synthesis of PT education.  Goal status: IN PROGRESS  2.  Pt will score >/= 63 on FOTO to demonstrate improvement in perceived lumbar function.  Goal status: IN PROGRESS 44  3.  Pt will be able to perform 5XSTS in under 12s  in order to demonstrate functional improvement above the cut off score for adults.   Goal status: GOAL MET  4.  Pt will be able to demonstrate/report ability to sit/stand/sleep for extended periods of time without pain in order to demonstrate functional improvement and tolerance to static positioning.    Goal status: ONGOING  5.  AROM in L shoulder will be within 10 degrees of R shoulder in all directions   Baseline:  Goal status:  PROGRESSING  6.  Will be able to use L UE for dressing/ADLs/hygiene without increase in pain  Baseline:  Goal status: Achieved 8/21  7.  Will be able to reach overhead to turn a fan on/off or put a light object on a shelf overhead with LUE without increase in pain  Baseline:  Goal status: Pt hasn't attempted recently  8.  Pt will perform hair care without significant difficulty.     Baseline:   Goal Status:  INITIAL       PLAN: PT FREQUENCY: 2x/week  PT DURATION: 2-3 weeks  PLANNED INTERVENTIONS: Therapeutic exercises, Therapeutic activity, Neuromuscular re-education, Balance training, Gait training, Patient/Family education, Joint manipulation, Joint mobilization, Stair training, Orthotic/Fit training, DME instructions, Aquatic Therapy, Dry Needling, Electrical stimulation, Spinal manipulation, Spinal mobilization, Cryotherapy, Moist heat, scar mobilization, Splintting, Taping, Vasopneumatic device, Traction, Ultrasound, Ionotophoresis 70m/ml Dexamethasone, Manual therapy, and Re-evaluation.  PLAN FOR NEXT SESSION:   Cont with land based PT to improve core and L shoulder strength and L shoulder ROM.   RSelinda MichaelsIII PT, DPT 09/18/21 5:35 PM

## 2021-09-22 ENCOUNTER — Ambulatory Visit (HOSPITAL_BASED_OUTPATIENT_CLINIC_OR_DEPARTMENT_OTHER): Payer: 59 | Admitting: Physical Therapy

## 2021-09-22 ENCOUNTER — Encounter (HOSPITAL_BASED_OUTPATIENT_CLINIC_OR_DEPARTMENT_OTHER): Payer: Self-pay | Admitting: Physical Therapy

## 2021-09-22 DIAGNOSIS — M5459 Other low back pain: Secondary | ICD-10-CM | POA: Diagnosis not present

## 2021-09-22 DIAGNOSIS — M25612 Stiffness of left shoulder, not elsewhere classified: Secondary | ICD-10-CM

## 2021-09-22 DIAGNOSIS — M25512 Pain in left shoulder: Secondary | ICD-10-CM

## 2021-09-22 DIAGNOSIS — R262 Difficulty in walking, not elsewhere classified: Secondary | ICD-10-CM

## 2021-09-22 DIAGNOSIS — M6281 Muscle weakness (generalized): Secondary | ICD-10-CM

## 2021-09-22 NOTE — Therapy (Signed)
OUTPATIENT PHYSICAL THERAPY THORACOLUMBAR / SHOULDER  TREATMENT NOTE      Patient Name: Melissa Roth MRN: [id number] DOB:16-Mar-1960, 61 y.o., female Today's Date: 09/23/2021   PT End of Session - 09/22/21 1500     Visit Number 23    Number of Visits 26    Date for PT Re-Evaluation 10/08/21    Authorization Type Aetna    PT Start Time 1438    PT Stop Time 1520    PT Time Calculation (min) 42 min    Activity Tolerance Patient tolerated treatment well    Behavior During Therapy WFL for tasks assessed/performed                        Past Medical History:  Diagnosis Date   Back pain    Cervical disc disorder with radiculopathy of cervical region    PONV (postoperative nausea and vomiting)    Sciatica    Vitamin D deficiency    Past Surgical History:  Procedure Laterality Date   ABDOMINAL HYSTERECTOMY     CHOLECYSTECTOMY N/A 06/07/2017   Procedure: LAPAROSCOPIC CHOLECYSTECTOMY;  Surgeon: Judeth Horn, MD;  Location: [place];  Service: General;  Laterality: N/A;   FOOT SURGERY     LUMBAR LAMINECTOMY/DECOMPRESSION MICRODISCECTOMY Left 11/10/2016   Procedure: Left L5-S1 discectomy;  Surgeon: Melina Schools, MD;  Location: [place];  Service: Orthopedics;  Laterality: Left;  120 mins   Patient Active Problem List   Diagnosis Date Noted   Family history of coronary artery disease 12/19/2019   Prediabetes 11/06/2019   Class 1 obesity due to excess calories with body mass index (BMI) of 32.0 to 32.9 in adult 11/05/2019   Bunion 01/19/2019   Pain in right knee 01/02/2019   Cervical radiculopathy 02/07/2018   Chronic pain syndrome 10/03/2017   Closed fracture of second toe of left foot 08/23/2017   DDD (degenerative disc disease), cervical 03/28/2017   Lumbar post-laminectomy syndrome 03/28/2017   Status post lumbar spine surgery for decompression of spinal cord 11/10/2016   Plantar fasciitis 02/03/2016   Left shoulder pain 12/26/2012   Low back pain 09/25/2012    Cervical pain (neck) 09/25/2012    PCP: Marda Stalker, PA-C  REFERRING PROVIDER: Melina Schools, MD / Nuala Alpha, MD  REFERRING DIAG: M54.50 (ICD-10-CM) - Low back pain, unspecified / M25.512 (ICD-10-CM) - Pain in left shoulder  Rationale for Evaluation and Treatment Rehabilitation  THERAPY DIAG:  Other low back pain  Difficulty walking  Muscle weakness (generalized)  Stiffness of left shoulder, not elsewhere classified  Acute pain of left shoulder  ONSET DATE: 05/2021  SUBJECTIVE:  SUBJECTIVE STATEMENT: Pt states she misses the pool.  Pt reports her back pain was bothering her yesterday.  She took Aleve and did her stretching.  Pt reports Aleve always helped.  Pt reports her shoulder was sore after prior Rx though no increased pain.  Pt reports she walks about 2 miles 2-3x/wk.  Pt reports she has performed some of her HEP.     FUNCTIONAL IMPROVEMENTS:  Lumbar:  ambulation, pain, increased tolerance with household chores including laundry and cleaning         Shoulder:  mobility, bathing, dressing, work activities including typing  FUNCTIONAL DEFICITS:  Lumbar: lifting objects like groceries, ascending > 2 flights of stairs, household chores, riding jet skis, biking      Shoulder:  using the elliptical, reaching laterally, reaching behind back, hair care  PERTINENT HISTORY:   2018 Lumbar laminectomy/decompression microdiscectomy (Left) Pre-diabetic C/S radic   PAIN:  Are you having pain? yes:  NPRS scale: Shoulder:  3-4/10  Lumbar: 6/10 Pain location: see above Pain description: dull, sore  Aggravating factors: using it  Relieving factors:  tylenol     PRECAUTIONS: None  WEIGHT BEARING RESTRICTIONS No  FALLS:  Has patient fallen in last 6 months? No  LIVING  ENVIRONMENT: Lives with: lives with their family Lives in: House/apartment Stairs: No Has following equipment at home: None  OCCUPATION: [employer]; at a desk   PLOF: Independent  PATIENT GOALS :   OBJECTIVE:   DIAGNOSTIC FINDINGS:  N/A   TODAY'S TREATMENT   Therapeutic Exercise:   -Reviewed current function, HEP compliance, response to prior Rx, and pain levels   -Pt received L shoulder PROM in flexion, abd, ER, and IR in supine   -Pt performed:  Supine alt UE/LE with TrA 2x10            Supine clamshells with TrA with GTB 2x12-15  Supine SLR with contralat UE ext with TrA 2x5-7 reps  Supine rhythmic stab's at 60/90/120 deg flexion 2x30 sec each  Supine shoulder ABC x 1 rep with 1#  S/L ER 3x10 reps  Standing rows with GTB x10 and approx 7-8 reps with TrA  ER/IR with arm at side with RTB/GTB 2x10     PATIENT EDUCATION:  Education details: exercise form and POC.  Encouraged pt to be compliant with HEP.     Person educated: Patient Education method: explanation, demonstration; verbal and tactile cues Education comprehension: verbalized understanding, returned demonstration, verbal cues required, and tactile cues required   HOME EXERCISE PROGRAM:  Access Code: [email or web address] URL: [email or web address] Date: 08/13/2021 Prepared by: [place]  Exercises - Supine Posterior Pelvic Tilt  - 2 x daily - 7 x weekly - 2 sets - 10 reps - 2 hold - Hooklying Single Knee to Chest Stretch  - 2 x daily - 7 x weekly - 1 sets - 10 reps - 5 hold - Seated Quadratus Lumborum Stretch in Chair  - 2 x daily - 7 x weekly - 1 sets - 3 reps - 30 hold - Seated Shoulder Flexion Table Top Stretch  - 3 x daily - 7 x weekly - 1 sets - 10 reps - 10 hold - Seated Shoulder Abduction Towel Slide at Table Top  - 3 x daily - 7 x weekly - 1 sets - 10 reps - 10 hold  Aquatic-  Circular Shoulder Pendulum with Table Support  - 3 x daily - 7 x weekly - 1 sets - 30  reps - Forward Backward March  - 1 x daily - 3 x weekly - 1 sets - Side Stepping  - 1 x daily - 7 x weekly - 1 sets - Bilateral Shoulder Horizontal Abduction Adduction AROM  - 1 x daily - 3 x weekly - 2 sets - 5-10 reps - Bilateral Shoulder Internal and External Rotation with Hand Paddles  - 1 x daily - 3 x weekly - 1 sets - 10 reps - Warrior I in [employer] with [employer]  - 1 x daily - 3 x weekly - 1 sets - 5 reps   - Standing Alternating Shoulder Flexion  - 1 x daily - 3 x weekly - 1 sets - 5-10 reps - Standing Hip Circles at [employer]  - 1 x daily - 3 x weekly - 1 sets - 10 reps - Piriformis Stretch at [employer]  - 1 x daily - 3 x weekly - 1 sets - 2 reps - 10-20 seconds seconds  hold - Hamstring Stretch at [employer]  - 1 x daily - 3 x weekly - 3 sets - 2 reps - 10-20 seconds hold - Standing 'L' Stretch at [employer]  - 1 x daily - 7 x weekly - 1 sets - 2 reps - 10-20 seconds hold  Updated HEP:   - Supine Transversus Abdominis Bracing - Hands on Stomach  - 1-2 x daily - 7 x weekly - 2 sets - 10 reps - Supine March  - 1 x daily - 7 x weekly - 2 sets - 10 reps - Hooklying Clamshell with Resistance  - 3-4 x weekly - 2 sets - 10 reps  ASSESSMENT:  CLINICAL IMPRESSION: PT performed land based therapy focusing on improving core, shoulder, and scapular strength.  Pt requires instruction and cuing for correct form and positioning with exercises.  Pt had much difficulty with sequencing contralat UE ext with SLR requiring frequent cuing.  Pt responded well to Rx having no increased pain after Rx.  Pt should benefit from continued skilled PT services to address goals and impairments and to assist in restoring desired level of function.        OBJECTIVE IMPAIRMENTS Abnormal gait, decreased activity tolerance, decreased balance, decreased mobility, difficulty walking, decreased ROM, decreased strength, hypomobility, increased muscle spasms, impaired flexibility, impaired sensation, improper body  mechanics, postural dysfunction, and pain.   ACTIVITY LIMITATIONS carrying, lifting, bending, sitting, standing, squatting, sleeping, stairs, transfers, and locomotion level  PARTICIPATION LIMITATIONS: cleaning, laundry, interpersonal relationship, driving, shopping, community activity, yard work, and exercise  PERSONAL FACTORS Age, Fitness, Time since onset of injury/illness/exacerbation, and 1-2 comorbidities:    are also affecting patient's functional outcome.   REHAB POTENTIAL: Fair    CLINICAL DECISION MAKING: Evolving/moderate complexity  EVALUATION COMPLEXITY: Moderate   GOALS:  SHORT TERM GOALS: Target date: 08/20/2021  Pt will become independent with HEP in order to demonstrate synthesis of PT education.   Goal status: IN PROGRESS   2.  Pt will report at least 2 pt reduction on NPRS scale for pain in order to demonstrate functional improvement with household activity, self care, and ADL.   Goal status: Achieved - 07/21/21  3.   Pt will score at least 3 pt increase on FOTO to demonstrate functional improvement in MCII and pt perceived function.    Goal status: NOT MET  4.  Pain in L shoulder to be no more than 3/10 at worst  Baseline: 5/10 at worst - 9/7 Goal  status: PARTIALLY MET  5..  Will have better understanding of posture and ergonomics  Baseline:  Goal status: INITIAL      LONG TERM GOALS: Target date:  10/08/2021  Pt  will become independent with final HEP in order to demonstrate synthesis of PT education.  Goal status: IN PROGRESS  2.  Pt will score >/= 63 on FOTO to demonstrate improvement in perceived lumbar function.  Goal status: IN PROGRESS 44  3.  Pt will be able to perform 5XSTS in under 12s  in order to demonstrate functional improvement above the cut off score for adults.   Goal status: GOAL MET  4.  Pt will be able to demonstrate/report ability to sit/stand/sleep for extended periods of time without pain in order to demonstrate  functional improvement and tolerance to static positioning.    Goal status: ONGOING  5.  AROM in L shoulder will be within 10 degrees of R shoulder in all directions  Baseline:  Goal status:  PROGRESSING  6.  Will be able to use L UE for dressing/ADLs/hygiene without increase in pain  Baseline:  Goal status: Achieved 8/21  7.  Will be able to reach overhead to turn a fan on/off or put a light object on a shelf overhead with LUE without increase in pain  Baseline:  Goal status: Pt hasn't attempted recently  8.  Pt will perform hair care without significant difficulty.     Baseline:   Goal Status:  INITIAL       PLAN: PT FREQUENCY: 2x/week  PT DURATION: 2-3 weeks  PLANNED INTERVENTIONS: Therapeutic exercises, Therapeutic activity, Neuromuscular re-education, Balance training, Gait training, Patient/Family education, Joint manipulation, Joint mobilization, Stair training, Orthotic/Fit training, DME instructions, Aquatic Therapy, Dry Needling, Electrical stimulation, Spinal manipulation, Spinal mobilization, Cryotherapy, Moist heat, scar mobilization, Splintting, Taping, Vasopneumatic device, Traction, Ultrasound, Ionotophoresis 37m/ml Dexamethasone, Manual therapy, and Re-evaluation.  PLAN FOR NEXT SESSION:   Cont with land based PT to improve core and L shoulder strength and L shoulder ROM.  Progress shoulder HEP  RSelinda MichaelsIII PT, DPT 09/23/21 2:11 PM

## 2021-09-24 ENCOUNTER — Ambulatory Visit (HOSPITAL_BASED_OUTPATIENT_CLINIC_OR_DEPARTMENT_OTHER): Payer: 59 | Admitting: Physical Therapy

## 2021-09-29 ENCOUNTER — Encounter (HOSPITAL_BASED_OUTPATIENT_CLINIC_OR_DEPARTMENT_OTHER): Payer: Self-pay | Admitting: Physical Therapy

## 2021-09-29 ENCOUNTER — Ambulatory Visit (HOSPITAL_BASED_OUTPATIENT_CLINIC_OR_DEPARTMENT_OTHER): Payer: 59 | Admitting: Physical Therapy

## 2021-09-29 DIAGNOSIS — M25512 Pain in left shoulder: Secondary | ICD-10-CM

## 2021-09-29 DIAGNOSIS — M25612 Stiffness of left shoulder, not elsewhere classified: Secondary | ICD-10-CM

## 2021-09-29 DIAGNOSIS — M5459 Other low back pain: Secondary | ICD-10-CM

## 2021-09-29 DIAGNOSIS — M6281 Muscle weakness (generalized): Secondary | ICD-10-CM

## 2021-09-29 DIAGNOSIS — R262 Difficulty in walking, not elsewhere classified: Secondary | ICD-10-CM

## 2021-09-29 NOTE — Therapy (Signed)
OUTPATIENT PHYSICAL THERAPY THORACOLUMBAR / SHOULDER  TREATMENT NOTE      Patient Name: Melissa Roth MRN: [id number] DOB:1960-08-10, 62 y.o., female Today's Date: 09/30/2021   PT End of Session - 09/29/21 1325     Visit Number 24    Number of Visits 26    Date for PT Re-Evaluation 10/08/21    Authorization Type Aet[employer]    PT Start Time 1307    PT Stop Time 1334   Pt had to leave appt early due to a MD appt across town   PT Time Calculation (min) 27 min    Activity Tolerance Patient tolerated treatment well    Behavior During Therapy WFL for tasks assessed/performed                         Past Medical History:  Diagnosis Date   Back pain    Cervical disc disorder with radiculopathy of cervical region    PONV (postoperative nausea and vomiting)    Sciatica    Vitamin D deficiency    Past Surgical History:  Procedure Laterality Date   ABDOMINAL HYSTERECTOMY     CHOLECYSTECTOMY N/A 06/07/2017   Procedure: LAPAROSCOPIC CHOLECYSTECTOMY;  Surgeon: Judeth Horn, MD;  Location: [facility];  Service: General;  Laterality: N/A;   FOOT SURGERY     LUMBAR LAMINECTOMY/DECOMPRESSION MICRODISCECTOMY Left 11/10/2016   Procedure: Left L5-S1 discectomy;  Surgeon: Melina Schools, MD;  Location: [facility];  Service: Orthopedics;  Laterality: Left;  120 mins   Patient Active Problem List   Diagnosis Date Noted   Family history of coronary artery disease 12/19/2019   Prediabetes 11/06/2019   Class 1 obesity due to excess calories with body mass index (BMI) of 32.0 to 32.9 in adult 11/05/2019   Bunion 01/19/2019   Pain in right knee 01/02/2019   Cervical radiculopathy 02/07/2018   Chronic pain syndrome 10/03/2017   Closed fracture of second toe of left foot 08/23/2017   DDD (degenerative disc disease), cervical 03/28/2017   Lumbar post-laminectomy syndrome 03/28/2017   Status post lumbar spine surgery for decompression of spinal cord 11/10/2016   Plantar fasciitis 02/03/2016    Left shoulder pain 12/26/2012   Low back pain 09/25/2012   Cervical pain (neck) 09/25/2012    PCP: Marda Stalker, PA-C  REFERRING PROVIDER: Melina Schools, MD / Nuala Alpha, MD  REFERRING DIAG: M54.50 (ICD-10-CM) - Low back pain, unspecified / M25.512 (ICD-10-CM) - Pain in left shoulder  Rationale for Evaluation and Treatment Rehabilitation  THERAPY DIAG:  Other low back pain  Difficulty walking  Muscle weakness (generalized)  Stiffness of left shoulder, not elsewhere classified  Acute pain of left shoulder  ONSET DATE: 05/2021  SUBJECTIVE:  SUBJECTIVE STATEMENT: Pt states she has to leave early today due to a MD appointment across town.  Pt reports her back pain was bothering her yesterday.  She took Aleve and did her stretching.  Pt reports Aleve always helped.  Pt reports her shoulder was sore after prior Rx though no increased pain.  Pt reports she walks about 2 miles 2-3x/wk.  Pt reports she performs her land based HEP 3x/wk and has not performed her aquatic exercises.  Pt reports her improved lifting objects including groceries and improved laundry in reference to her shoulder.  She also reports improved performance of cleaning her home.    PERTINENT HISTORY:   2018 Lumbar laminectomy/decompression microdiscectomy (Left) Pre-diabetic C/S radic   PAIN:  Are you having pain? yes:  NPRS scale: Shoulder:  4/10  Lumbar: 6/10 Pain location: see above Pain description: dull, sore  Aggravating factors: using it  Relieving factors:  tylenol     PRECAUTIONS: None  WEIGHT BEARING RESTRICTIONS No  FALLS:  Has patient fallen in last 6 months? No  LIVING ENVIRONMENT: Lives with: lives with their family Lives in: House/apartment Stairs: No Has following equipment at home:  None  OCCUPATION: [place]; at a desk   PLOF: Independent  PATIENT GOALS :   OBJECTIVE:   DIAGNOSTIC FINDINGS:  N/A   TODAY'S TREATMENT   Therapeutic Exercise:   -Reviewed current function, HEP compliance, response to prior Rx, and pain levels   -Pt received L shoulder PROM in flexion, abd, ER, and IR in supine   -Pt performed:  Supine alt UE/LE with TrA 2x10  Supine SLR with contralat UE ext with TrA 2x10 reps  Supine rhythmic stab's at 60/90/120 deg flexion 2x30 sec each  Supine shoulder ABC x 1 rep with 1#  S/L ER x10 reps with 1#      PATIENT EDUCATION:  Education details: exercise form and POC.  Encouraged pt to be compliant with HEP.     Person educated: Patient Education method: explanation, demonstration; verbal and tactile cues Education comprehension: verbalized understanding, returned demonstration, verbal cues required, and tactile cues required   HOME EXERCISE PROGRAM:  Access Code: [email or web address] URL: [email or web address] Date: 08/13/2021 Prepared by: [place]  Exercises - Supine Posterior Pelvic Tilt  - 2 x daily - 7 x weekly - 2 sets - 10 reps - 2 hold - Hooklying Single Knee to Chest Stretch  - 2 x daily - 7 x weekly - 1 sets - 10 reps - 5 hold - Seated Quadratus Lumborum Stretch in Chair  - 2 x daily - 7 x weekly - 1 sets - 3 reps - 30 hold - Seated Shoulder Flexion Table Top Stretch  - 3 x daily - 7 x weekly - 1 sets - 10 reps - 10 hold - Seated Shoulder Abduction Towel Slide at Table Top  - 3 x daily - 7 x weekly - 1 sets - 10 reps - 10 hold  Aquatic-  Circular Shoulder Pendulum with Table Support  - 3 x daily - 7 x weekly - 1 sets - 30 reps - Forward Backward March  - 1 x daily - 3 x weekly - 1 sets - Side Stepping  - 1 x daily - 7 x weekly - 1 sets - Bilateral Shoulder Horizontal Abduction Adduction AROM  - 1 x daily - 3 x weekly - 2 sets - 5-10 reps - Bilateral Shoulder Internal and External Rotation  with Hand Paddles  -  1 x daily - 3 x weekly - 1 sets - 10 reps - Warrior I in [employer] with [employer]  - 1 x daily - 3 x weekly - 1 sets - 5 reps   - Standing Alternating Shoulder Flexion  - 1 x daily - 3 x weekly - 1 sets - 5-10 reps - Standing Hip Circles at [employer]  - 1 x daily - 3 x weekly - 1 sets - 10 reps - Piriformis Stretch at [employer]  - 1 x daily - 3 x weekly - 1 sets - 2 reps - 10-20 seconds seconds  hold - Hamstring Stretch at [employer]  - 1 x daily - 3 x weekly - 3 sets - 2 reps - 10-20 seconds hold - Standing 'L' Stretch at [employer]  - 1 x daily - 7 x weekly - 1 sets - 2 reps - 10-20 seconds hold    - Supine Transversus Abdominis Bracing - Hands on Stomach  - 1-2 x daily - 7 x weekly - 2 sets - 10 reps - Supine March  - 1 x daily - 7 x weekly - 2 sets - 10 reps - Hooklying Clamshell with Resistance  - 3-4 x weekly - 2 sets - 10 reps  ASSESSMENT:  CLINICAL IMPRESSION: Rx time was limited today due to pt having to leave early to go to a MD appt.  Pt is improving with function as evidenced by subjective reports of improved household cleaning, performing laundry, and lifting.  Pt continues to require cuing and instruction for proper sequencing of core exercises including supine alt UE/LE and supine SLR with contralateral UE ext.  Pt had reduced tightness with shoulder flexion PROM today compared to prior Rx.  She had good PROM t/o L shoulder.  Pt responded well to Rx having no increased pain after Rx.  Pt should benefit from continued skilled PT services to address goals and impairments and to assist in restoring desired level of function.        OBJECTIVE IMPAIRMENTS Abnormal gait, decreased activity tolerance, decreased balance, decreased mobility, difficulty walking, decreased ROM, decreased strength, hypomobility, increased muscle spasms, impaired flexibility, impaired sensation, improper body mechanics, postural dysfunction, and pain.   ACTIVITY LIMITATIONS carrying,  lifting, bending, sitting, standing, squatting, sleeping, stairs, transfers, and locomotion level  PARTICIPATION LIMITATIONS: cleaning, laundry, interpersonal relationship, driving, shopping, community activity, yard work, and exercise  PERSONAL FACTORS Age, Fitness, Time since onset of injury/illness/exacerbation, and 1-2 comorbidities:    are also affecting patient's functional outcome.   REHAB POTENTIAL: Fair    CLINICAL DECISION MAKING: Evolving/moderate complexity  EVALUATION COMPLEXITY: Moderate   GOALS:  SHORT TERM GOALS: Target date: 08/20/2021  Pt will become independent with HEP in order to demonstrate synthesis of PT education.   Goal status: IN PROGRESS   2.  Pt will report at least 2 pt reduction on NPRS scale for pain in order to demonstrate functional improvement with household activity, self care, and ADL.   Goal status: Achieved - 07/21/21  3.   Pt will score at least 3 pt increase on FOTO to demonstrate functional improvement in MCII and pt perceived function.    Goal status: NOT MET  4.  Pain in L shoulder to be no more than 3/10 at worst  Baseline: 5/10 at worst - 9/7 Goal status: PARTIALLY MET  5..  Will have better understanding of posture and ergonomics  Baseline:  Goal status: INITIAL  LONG TERM GOALS: Target date:  10/08/2021  Pt  will become independent with final HEP in order to demonstrate synthesis of PT education.  Goal status: IN PROGRESS  2.  Pt will score >/= 63 on FOTO to demonstrate improvement in perceived lumbar function.  Goal status: IN PROGRESS 44  3.  Pt will be able to perform 5XSTS in under 12s  in order to demonstrate functional improvement above the cut off score for adults.   Goal status: GOAL MET  4.  Pt will be able to demonstrate/report ability to sit/stand/sleep for extended periods of time without pain in order to demonstrate functional improvement and tolerance to static positioning.    Goal status:  ONGOING  5.  AROM in L shoulder will be within 10 degrees of R shoulder in all directions  Baseline:  Goal status:  PROGRESSING  6.  Will be able to use L UE for dressing/ADLs/hygiene without increase in pain  Baseline:  Goal status: Achieved 8/21  7.  Will be able to reach overhead to turn a fan on/off or put a light object on a shelf overhead with LUE without increase in pain  Baseline:  Goal status: Pt hasn't attempted recently  8.  Pt will perform hair care without significant difficulty.     Baseline:   Goal Status:  INITIAL       PLAN: PT FREQUENCY: 2x/week  PT DURATION: 2-3 weeks  PLANNED INTERVENTIONS: Therapeutic exercises, Therapeutic activity, Neuromuscular re-education, Balance training, Gait training, Patient/Family education, Joint manipulation, Joint mobilization, Stair training, Orthotic/Fit training, DME instructions, Aquatic Therapy, Dry Needling, Electrical stimulation, Spinal manipulation, Spinal mobilization, Cryotherapy, Moist heat, scar mobilization, Splintting, Taping, Vasopneumatic device, Traction, Ultrasound, Ionotophoresis 38m/ml Dexamethasone, Manual therapy, and Re-evaluation.  PLAN FOR NEXT SESSION:   Cont with land based PT to improve core and L shoulder strength and L shoulder ROM.  Progress shoulder HEP  RSelinda MichaelsIII PT, DPT 09/30/21 9:22 PM

## 2021-10-01 ENCOUNTER — Ambulatory Visit (HOSPITAL_BASED_OUTPATIENT_CLINIC_OR_DEPARTMENT_OTHER): Payer: 59 | Admitting: Physical Therapy

## 2021-10-01 ENCOUNTER — Encounter (HOSPITAL_BASED_OUTPATIENT_CLINIC_OR_DEPARTMENT_OTHER): Payer: Self-pay | Admitting: Physical Therapy

## 2021-10-01 DIAGNOSIS — M25612 Stiffness of left shoulder, not elsewhere classified: Secondary | ICD-10-CM

## 2021-10-01 DIAGNOSIS — M5459 Other low back pain: Secondary | ICD-10-CM

## 2021-10-01 DIAGNOSIS — M25512 Pain in left shoulder: Secondary | ICD-10-CM

## 2021-10-01 DIAGNOSIS — R262 Difficulty in walking, not elsewhere classified: Secondary | ICD-10-CM

## 2021-10-01 DIAGNOSIS — M6281 Muscle weakness (generalized): Secondary | ICD-10-CM

## 2021-10-01 NOTE — Therapy (Signed)
OUTPATIENT PHYSICAL THERAPY THORACOLUMBAR / SHOULDER  TREATMENT NOTE      Patient Name: Melissa Roth MRN: [id number] DOB:03/04/1960, 61 y.o., female Today's Date: 10/01/2021   PT End of Session - 10/01/21 1519     Visit Number 25    Number of Visits 26    Date for PT Re-Evaluation 10/08/21    Authorization Type Aetna    PT Start Time 1436    PT Stop Time 1517    PT Time Calculation (min) 41 min    Activity Tolerance Patient tolerated treatment well    Behavior During Therapy WFL for tasks assessed/performed                          Past Medical History:  Diagnosis Date   Back pain    Cervical disc disorder with radiculopathy of cervical region    PONV (postoperative nausea and vomiting)    Sciatica    Vitamin D deficiency    Past Surgical History:  Procedure Laterality Date   ABDOMINAL HYSTERECTOMY     CHOLECYSTECTOMY N/A 06/07/2017   Procedure: LAPAROSCOPIC CHOLECYSTECTOMY;  Surgeon: Judeth Horn, MD;  Location: [facility];  Service: General;  Laterality: N/A;   FOOT SURGERY     LUMBAR LAMINECTOMY/DECOMPRESSION MICRODISCECTOMY Left 11/10/2016   Procedure: Left L5-S1 discectomy;  Surgeon: Melina Schools, MD;  Location: [facility];  Service: Orthopedics;  Laterality: Left;  120 mins   Patient Active Problem List   Diagnosis Date Noted   Family history of coronary artery disease 12/19/2019   Prediabetes 11/06/2019   Class 1 obesity due to excess calories with body mass index (BMI) of 32.0 to 32.9 in adult 11/05/2019   Bunion 01/19/2019   Pain in right knee 01/02/2019   Cervical radiculopathy 02/07/2018   Chronic pain syndrome 10/03/2017   Closed fracture of second toe of left foot 08/23/2017   DDD (degenerative disc disease), cervical 03/28/2017   Lumbar post-laminectomy syndrome 03/28/2017   Status post lumbar spine surgery for decompression of spinal cord 11/10/2016   Plantar fasciitis 02/03/2016   Left shoulder pain 12/26/2012   Low back pain 09/25/2012    Cervical pain (neck) 09/25/2012    PCP: Marda Stalker, PA-C  REFERRING PROVIDER: Melina Schools, MD / Nuala Alpha, MD  REFERRING DIAG: M54.50 (ICD-10-CM) - Low back pain, unspecified / M25.512 (ICD-10-CM) - Pain in left shoulder  Rationale for Evaluation and Treatment Rehabilitation  THERAPY DIAG:  Other low back pain  Difficulty walking  Muscle weakness (generalized)  Stiffness of left shoulder, not elsewhere classified  Acute pain of left shoulder  ONSET DATE: 05/2021  SUBJECTIVE:  SUBJECTIVE STATEMENT: Pt denies any adverse effects after prior Rx.  Pt reports she walks about 2 miles 2-3x/wk.  Pt reports she performs her land based HEP 3x/wk and has not performed her aquatic exercises.  Pt did not perform HEP yesterday though has been helping her mom.  Pt reports improved lifting objects including groceries and improved laundry in reference to her shoulder.  She also reports improved performance of cleaning her home and her car.   PERTINENT HISTORY:   2018 Lumbar laminectomy/decompression microdiscectomy (Left) Pre-diabetic C/S radic   PAIN:  Are you having pain? yes:  NPRS scale: Shoulder:  3-4/10  Lumbar: 6/10 Pain location: see above Pain description: dull, sore  Aggravating factors: using it  Relieving factors:  tylenol     PRECAUTIONS: None  WEIGHT BEARING RESTRICTIONS No  FALLS:  Has patient fallen in last 6 months? No  LIVING ENVIRONMENT: Lives with: lives with their family Lives in: House/apartment Stairs: No Has following equipment at home: None  OCCUPATION: [employer]; at a desk   PLOF: Independent  PATIENT GOALS :   OBJECTIVE:   DIAGNOSTIC FINDINGS:  N/A   TODAY'S TREATMENT   Therapeutic Exercise:   -Reviewed current function, HEP compliance,  response to prior Rx, and pain levels   -Pt received L shoulder PROM in flexion, abd, ER, and IR in supine   -Pt performed:  Supine alt UE/LE with TrA 2x10  Supine SLR with contralat UE ext with TrA 2x10 reps  Supine clams with GTB 2x10 reps  Supine rhythmic stab's at 60/90/120 deg flexion 2x30 sec each  Supine shoulder ABC x 1 rep with 1#  ER/IR with RTB/GTB 2x 10 reps  Rows with GTB 2x10 reps  Paloff press with Tra with RTB x 10 each      PATIENT EDUCATION:  Education details: exercise form and POC.  Encouraged pt to be compliant with HEP.     Person educated: Patient Education method: explanation, demonstration; verbal and tactile cues Education comprehension: verbalized understanding, returned demonstration, verbal cues required, and tactile cues required   HOME EXERCISE PROGRAM:  Access Code: [email or web address] URL: [email or web address] Date: 08/13/2021 Prepared by: [place]  Exercises - Supine Posterior Pelvic Tilt  - 2 x daily - 7 x weekly - 2 sets - 10 reps - 2 hold - Hooklying Single Knee to Chest Stretch  - 2 x daily - 7 x weekly - 1 sets - 10 reps - 5 hold - Seated Quadratus Lumborum Stretch in Chair  - 2 x daily - 7 x weekly - 1 sets - 3 reps - 30 hold - Seated Shoulder Flexion Table Top Stretch  - 3 x daily - 7 x weekly - 1 sets - 10 reps - 10 hold - Seated Shoulder Abduction Towel Slide at Table Top  - 3 x daily - 7 x weekly - 1 sets - 10 reps - 10 hold  Aquatic-  Circular Shoulder Pendulum with Table Support  - 3 x daily - 7 x weekly - 1 sets - 30 reps - Forward Backward March  - 1 x daily - 3 x weekly - 1 sets - Side Stepping  - 1 x daily - 7 x weekly - 1 sets - Bilateral Shoulder Horizontal Abduction Adduction AROM  - 1 x daily - 3 x weekly - 2 sets - 5-10 reps - Bilateral Shoulder Internal and External Rotation with Hand Paddles  - 1 x daily - 3 x weekly - 1  sets - 10 reps - Warrior I in [employer] with [employer]   - 1 x daily - 3 x weekly - 1 sets - 5 reps   - Standing Alternating Shoulder Flexion  - 1 x daily - 3 x weekly - 1 sets - 5-10 reps - Standing Hip Circles at [employer]  - 1 x daily - 3 x weekly - 1 sets - 10 reps - Piriformis Stretch at [employer]  - 1 x daily - 3 x weekly - 1 sets - 2 reps - 10-20 seconds seconds  hold - Hamstring Stretch at [employer]  - 1 x daily - 3 x weekly - 3 sets - 2 reps - 10-20 seconds hold - Standing 'L' Stretch at [employer]  - 1 x daily - 7 x weekly - 1 sets - 2 reps - 10-20 seconds hold    - Supine Transversus Abdominis Bracing - Hands on Stomach  - 1-2 x daily - 7 x weekly - 2 sets - 10 reps - Supine March  - 1 x daily - 7 x weekly - 2 sets - 10 reps - Hooklying Clamshell with Resistance  - 3-4 x weekly - 2 sets - 10 reps  ASSESSMENT:  CLINICAL IMPRESSION: Pt is improving with function as evidenced by subjective reports of improved household and car cleaning, performing laundry, and lifting.  Pt continues to require cuing and instruction for proper sequencing of core exercises including supine alt UE/LE and supine SLR with contralateral UE ext though has improved form with all exercises.  She had good PROM t/o L shoulder.  Pt responded well to Rx having no increased pain after Rx.  Pt should benefit from continued skilled PT services to address goals and impairments and to assist in restoring desired level of function.        OBJECTIVE IMPAIRMENTS Abnormal gait, decreased activity tolerance, decreased balance, decreased mobility, difficulty walking, decreased ROM, decreased strength, hypomobility, increased muscle spasms, impaired flexibility, impaired sensation, improper body mechanics, postural dysfunction, and pain.   ACTIVITY LIMITATIONS carrying, lifting, bending, sitting, standing, squatting, sleeping, stairs, transfers, and locomotion level  PARTICIPATION LIMITATIONS: cleaning, laundry, interpersonal relationship, driving, shopping, community activity, yard  work, and exercise  PERSONAL FACTORS Age, Fitness, Time since onset of injury/illness/exacerbation, and 1-2 comorbidities:    are also affecting patient's functional outcome.   REHAB POTENTIAL: Fair    CLINICAL DECISION MAKING: Evolving/moderate complexity  EVALUATION COMPLEXITY: Moderate   GOALS:  SHORT TERM GOALS: Target date: 08/20/2021  Pt will become independent with HEP in order to demonstrate synthesis of PT education.   Goal status: IN PROGRESS   2.  Pt will report at least 2 pt reduction on NPRS scale for pain in order to demonstrate functional improvement with household activity, self care, and ADL.   Goal status: Achieved - 07/21/21  3.   Pt will score at least 3 pt increase on FOTO to demonstrate functional improvement in MCII and pt perceived function.    Goal status: NOT MET  4.  Pain in L shoulder to be no more than 3/10 at worst  Baseline: 5/10 at worst - 9/7 Goal status: PARTIALLY MET  5..  Will have better understanding of posture and ergonomics  Baseline:  Goal status: INITIAL      LONG TERM GOALS: Target date:  10/08/2021  Pt  will become independent with final HEP in order to demonstrate synthesis of PT education.  Goal status: IN PROGRESS  2.  Pt will score >/= 63 on FOTO to demonstrate improvement in perceived lumbar function.  Goal status: IN PROGRESS 44  3.  Pt will be able to perform 5XSTS in under 12s  in order to demonstrate functional improvement above the cut off score for adults.   Goal status: GOAL MET  4.  Pt will be able to demonstrate/report ability to sit/stand/sleep for extended periods of time without pain in order to demonstrate functional improvement and tolerance to static positioning.    Goal status: ONGOING  5.  AROM in L shoulder will be within 10 degrees of R shoulder in all directions  Baseline:  Goal status:  PROGRESSING  6.  Will be able to use L UE for dressing/ADLs/hygiene without increase in pain  Baseline:   Goal status: Achieved 8/21  7.  Will be able to reach overhead to turn a fan on/off or put a light object on a shelf overhead with LUE without increase in pain  Baseline:  Goal status: Pt hasn't attempted recently  8.  Pt will perform hair care without significant difficulty.     Baseline:   Goal Status:  INITIAL       PLAN: PT FREQUENCY: 2x/week  PT DURATION: 2-3 weeks  PLANNED INTERVENTIONS: Therapeutic exercises, Therapeutic activity, Neuromuscular re-education, Balance training, Gait training, Patient/Family education, Joint manipulation, Joint mobilization, Stair training, Orthotic/Fit training, DME instructions, Aquatic Therapy, Dry Needling, Electrical stimulation, Spinal manipulation, Spinal mobilization, Cryotherapy, Moist heat, scar mobilization, Splintting, Taping, Vasopneumatic device, Traction, Ultrasound, Ionotophoresis 44m/ml Dexamethasone, Manual therapy, and Re-evaluation.  PLAN FOR NEXT SESSION:   Possible discharge next week.  Recert next Rx.  Cont with land based PT to improve core and L shoulder strength and L shoulder ROM.  Progress shoulder HEP  RSelinda MichaelsIII PT, DPT 10/01/21 9:53 PM

## 2021-10-04 NOTE — Therapy (Signed)
OUTPATIENT PHYSICAL THERAPY THORACOLUMBAR / SHOULDER  TREATMENT NOTE  / RECERTIFICATION    Patient Name: MALASHA KLEPPE MRN: [id number] DOB:1960-09-15, 61 y.o., female Today's Date: 10/05/2021   PT End of Session - 10/05/21 0941     Visit Number 26    Number of Visits 27    Date for PT Re-Evaluation 10/19/21    Authorization Type [employer]    PT Start Time [phone]    PT Stop Time 1020    PT Time Calculation (min) 39 min    Activity Tolerance Patient tolerated treatment well    Behavior During Therapy WFL for tasks assessed/performed                           Past Medical History:  Diagnosis Date   Back pain    Cervical disc disorder with radiculopathy of cervical region    PONV (postoperative nausea and vomiting)    Sciatica    Vitamin D deficiency    Past Surgical History:  Procedure Laterality Date   ABDOMINAL HYSTERECTOMY     CHOLECYSTECTOMY N/A 06/07/2017   Procedure: LAPAROSCOPIC CHOLECYSTECTOMY;  Surgeon: Judeth Horn, MD;  Location: [place];  Service: General;  Laterality: N/A;   FOOT SURGERY     LUMBAR LAMINECTOMY/DECOMPRESSION MICRODISCECTOMY Left 11/10/2016   Procedure: Left L5-S1 discectomy;  Surgeon: Melina Schools, MD;  Location: [place];  Service: Orthopedics;  Laterality: Left;  120 mins   Patient Active Problem List   Diagnosis Date Noted   Family history of coronary artery disease 12/19/2019   Prediabetes 11/06/2019   Class 1 obesity due to excess calories with body mass index (BMI) of 32.0 to 32.9 in adult 11/05/2019   Bunion 01/19/2019   Pain in right knee 01/02/2019   Cervical radiculopathy 02/07/2018   Chronic pain syndrome 10/03/2017   Closed fracture of second toe of left foot 08/23/2017   DDD (degenerative disc disease), cervical 03/28/2017   Lumbar post-laminectomy syndrome 03/28/2017   Status post lumbar spine surgery for decompression of spinal cord 11/10/2016   Plantar fasciitis 02/03/2016   Left shoulder pain 12/26/2012   Low  back pain 09/25/2012   Cervical pain (neck) 09/25/2012    PCP: Marda Stalker, PA-C  REFERRING PROVIDER: Melina Schools, MD / Nuala Alpha, MD  REFERRING DIAG: M54.50 (ICD-10-CM) - Low back pain, unspecified / M25.512 (ICD-10-CM) - Pain in left shoulder  Rationale for Evaluation and Treatment Rehabilitation  THERAPY DIAG:  Other low back pain  Difficulty walking  Muscle weakness (generalized)  Stiffness of left shoulder, not elsewhere classified  Acute pain of left shoulder  ONSET DATE: 05/2021  SUBJECTIVE:  SUBJECTIVE STATEMENT: Pt denies any adverse effects after prior Rx.  Pt reports improved lifting objects including groceries and improved laundry in reference to her shoulder.  She also reports improved performance of cleaning her home and her car.  Pt was able to plant some flowers this past weekend which she was not able to.  Pt attributes her increased back pain to the change in weather or maybe planting flowers.  Pt states her back has been about the same.  Pt denies any recent improvements relating to her lumbar.  Pt reports she is able to perform hair care without significant difficulty.  Pt feels a twinge in her shoulder with lifting objects onto an overhead shelf and limits her L UE from putting things on an overhead shelf.  Pt states she has done more of her home exercises.    PERTINENT HISTORY:   2018 Lumbar laminectomy/decompression microdiscectomy (Left) Pre-diabetic C/S radic   PAIN:  Are you having pain? yes:  NPRS scale: Shoulder:  Current:  4/10, Worst:  4/10,  Best:  1/10  Lumbar: Current:  7/10, Worst:  10+/10, Best:  4-5/10  Pain location: see above Pain description: dull, sore  Aggravating factors: using it  Relieving factors:  tylenol     PRECAUTIONS:  None  WEIGHT BEARING RESTRICTIONS No  FALLS:  Has patient fallen in last 6 months? No  LIVING ENVIRONMENT: Lives with: lives with their family Lives in: House/apartment Stairs: No Has following equipment at home: None  OCCUPATION: [employer]; at a desk   PLOF: Independent  PATIENT GOALS :   OBJECTIVE:   DIAGNOSTIC FINDINGS:  N/A   TODAY'S TREATMENT  Therapeutic Exercise:  -Reviewed current function, HEP compliance, response to prior Rx, and pain levels.  -Assessed strength and ROM.   -Pt completed FOTO.   PATIENT SURVEYS:  FOTO:  Lumbar:    50 with a goal of 63 at visit #12.    L shoulder:  77 with a goal of 68 at visit #11            -L shoulder AROM: Flex:  163/168 Abd:  156  [place]:  150 ER:  77 IR:  70  -R shoulder AROM:  Abd:  167, ER: 78    -L shoulder MMT: Flexion:  L:  5/5 [place]:  5/5 w/n available ROM Abd:  4+/5 w/n available ROM ER: 4+/5 IR:  4+/5    Lumbar AROM: Flexion:  25% limited Extenstion:  25% limted SB:  25% lim bilat Rotations:  WFL bilat           -Pt performed:  ER/IR with RTB/GTB 2x 10 reps  Rows with GTB 2x10 reps  Paloff press with Tra with RTB x 10 each      PATIENT EDUCATION:  Education details: exercise form and POC.  Encouraged pt to be compliant with HEP.     Person educated: Patient Education method: explanation, demonstration; verbal and tactile cues Education comprehension: verbalized understanding, returned demonstration, verbal cues required, and tactile cues required   HOME EXERCISE PROGRAM:  Access Code: [email or web address] URL: [email or web address] Date: 08/13/2021 Prepared by: [place]  Exercises - Supine Posterior Pelvic Tilt  - 2 x daily - 7 x weekly - 2 sets - 10 reps - 2 hold - Hooklying Single Knee to Chest Stretch  - 2 x daily - 7 x weekly - 1 sets - 10 reps - 5 hold - Seated Quadratus Lumborum Stretch in Chair  - 2  x daily - 7 x weekly - 1 sets - 3  reps - 30 hold - Seated Shoulder Flexion Table Top Stretch  - 3 x daily - 7 x weekly - 1 sets - 10 reps - 10 hold - Seated Shoulder Abduction Towel Slide at Table Top  - 3 x daily - 7 x weekly - 1 sets - 10 reps - 10 hold  Aquatic-  Circular Shoulder Pendulum with Table Support  - 3 x daily - 7 x weekly - 1 sets - 30 reps - Forward Backward March  - 1 x daily - 3 x weekly - 1 sets - Side Stepping  - 1 x daily - 7 x weekly - 1 sets - Bilateral Shoulder Horizontal Abduction Adduction AROM  - 1 x daily - 3 x weekly - 2 sets - 5-10 reps - Bilateral Shoulder Internal and External Rotation with Hand Paddles  - 1 x daily - 3 x weekly - 1 sets - 10 reps - Warrior I in [employer] with [employer]  - 1 x daily - 3 x weekly - 1 sets - 5 reps   - Standing Alternating Shoulder Flexion  - 1 x daily - 3 x weekly - 1 sets - 5-10 reps - Standing Hip Circles at [employer]  - 1 x daily - 3 x weekly - 1 sets - 10 reps - Piriformis Stretch at [employer]  - 1 x daily - 3 x weekly - 1 sets - 2 reps - 10-20 seconds seconds  hold - Hamstring Stretch at [employer]  - 1 x daily - 3 x weekly - 3 sets - 2 reps - 10-20 seconds hold - Standing 'L' Stretch at [employer]  - 1 x daily - 7 x weekly - 1 sets - 2 reps - 10-20 seconds hold    - Supine Transversus Abdominis Bracing - Hands on Stomach  - 1-2 x daily - 7 x weekly - 2 sets - 10 reps - Supine March  - 1 x daily - 7 x weekly - 2 sets - 10 reps - Hooklying Clamshell with Resistance  - 3-4 x weekly - 2 sets - 10 reps  ASSESSMENT:  CLINICAL IMPRESSION: Pt has made great progress relating to her shoulder though seems to have plateaued in lumbar.  Pt demonstrates improved AROM t/o L shoulder though had no change in lumbar AROM.  Pt has improved much with functional usage of L UE.  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  Pt has improved with overhead activities  though is still limited.  Pt continues to have lumbar pain and reports no changes in lumbar recently.  Pt has increased performance of HEP from prior Rx though has not been compliant with HEP.  Pt demonstrates improved strength t/o L shoulder.  She demonstrates clinically significant improvement in self perceived disability with shoulder FOTO score improving from 48 prior to 77 currently.  She met her shoulder FOTO score.  Lumbar FOTO score has improved from prior though still worse from her initial score.  Pt has met STG's #1,2 and LTG's #3,6,8 and partially met STG #4 and LTG's #5,7.  Pt should benefit from one more PT visit to establish long term land based HEP.      OBJECTIVE IMPAIRMENTS Abnormal gait, decreased activity tolerance, decreased balance, decreased mobility, difficulty walking, decreased ROM, decreased  strength, hypomobility, increased muscle spasms, impaired flexibility, impaired sensation, improper body mechanics, postural dysfunction, and pain.   ACTIVITY LIMITATIONS carrying, lifting, bending, sitting, standing, squatting, sleeping, stairs, transfers, and locomotion level  PARTICIPATION LIMITATIONS: cleaning, laundry, interpersonal relationship, driving, shopping, community activity, yard work, and exercise  PERSONAL FACTORS Age, Fitness, Time since onset of injury/illness/exacerbation, and 1-2 comorbidities:    are also affecting patient's functional outcome.   REHAB POTENTIAL: Fair    CLINICAL DECISION MAKING: Evolving/moderate complexity  EVALUATION COMPLEXITY: Moderate   GOALS:  SHORT TERM GOALS: Target date: 08/20/2021  Pt will become independent with HEP in order to demonstrate synthesis of PT education.   Goal status: GOAL MET  2.  Pt will report at least 2 pt reduction on NPRS scale for pain in order to demonstrate functional improvement with household activity, self care, and ADL.   Goal status: Achieved - 07/21/21  3.   Pt will score at least 3 pt increase  on FOTO to demonstrate functional improvement in MCII and pt perceived function.    Goal status: NOT MET  4.  Pain in L shoulder to be no more than 3/10 at worst  Baseline: 7/10 at worst - 10/2 Goal status: PARTIALLY MET  5.  Will have better understanding of posture and ergonomics  Baseline:  Goal status: INITIAL      LONG TERM GOALS: Target date:  10/19/2021   Pt  will become independent with final HEP in order to demonstrate synthesis of PT education.  Goal status: IN PROGRESS  2.  Pt will score >/= 63 on FOTO to demonstrate improvement in perceived lumbar function.  Goal status: NOT MET FOR LUMBAR, MET FOR SHOULDER  3.  Pt will be able to perform 5XSTS in under 12s  in order to demonstrate functional improvement above the cut off score for adults.   Goal status: GOAL MET  4.  Pt will be able to demonstrate/report ability to sit/stand/sleep for extended periods of time without pain in order to demonstrate functional improvement and tolerance to static positioning.    Goal status: ONGOING  5.  AROM in L shoulder will be within 10 degrees of R shoulder in all directions  Baseline:  Goal status: 90% MET  6.  Will be able to use L UE for dressing/ADLs/hygiene without increase in pain  Baseline:  Goal status: Achieved 8/21  7.  Will be able to reach overhead to turn a fan on/off or put a light object on a shelf overhead with LUE without increase in pain  Baseline:  Goal status: PARTIALLY MET  8.  Pt will perform hair care without significant difficulty.     Baseline:   Goal Status:  GOAL MET  10/2       PLAN: PT FREQUENCY: 2 visits  PT DURATION: 2 weeks  PLANNED INTERVENTIONS: Therapeutic exercises, Therapeutic activity, Neuromuscular re-education, Balance training, Gait training, Patient/Family education, Joint manipulation, Joint mobilization, Stair training, Orthotic/Fit training, DME instructions, Aquatic Therapy, Dry Needling, Electrical stimulation,  Spinal manipulation, Spinal mobilization, Cryotherapy, Moist heat, scar mobilization, Splintting, Taping, Vasopneumatic device, Traction, Ultrasound, Ionotophoresis 81m/ml Dexamethasone, Manual therapy, and Re-evaluation.  PLAN FOR NEXT SESSION:   1 more visit to establish long term land based HEP.  Discharge next visit.   RSelinda MichaelsIII PT, DPT 10/05/21 1:44 PM

## 2021-10-05 ENCOUNTER — Encounter (HOSPITAL_BASED_OUTPATIENT_CLINIC_OR_DEPARTMENT_OTHER): Payer: Self-pay | Admitting: Physical Therapy

## 2021-10-05 ENCOUNTER — Ambulatory Visit (HOSPITAL_BASED_OUTPATIENT_CLINIC_OR_DEPARTMENT_OTHER): Payer: 59 | Attending: Orthopedic Surgery | Admitting: Physical Therapy

## 2021-10-05 DIAGNOSIS — M25512 Pain in left shoulder: Secondary | ICD-10-CM | POA: Diagnosis present

## 2021-10-05 DIAGNOSIS — M5459 Other low back pain: Secondary | ICD-10-CM | POA: Diagnosis present

## 2021-10-05 DIAGNOSIS — R262 Difficulty in walking, not elsewhere classified: Secondary | ICD-10-CM | POA: Insufficient documentation

## 2021-10-05 DIAGNOSIS — M6281 Muscle weakness (generalized): Secondary | ICD-10-CM | POA: Diagnosis present

## 2021-10-05 DIAGNOSIS — M25612 Stiffness of left shoulder, not elsewhere classified: Secondary | ICD-10-CM | POA: Insufficient documentation

## 2021-10-13 ENCOUNTER — Ambulatory Visit (HOSPITAL_BASED_OUTPATIENT_CLINIC_OR_DEPARTMENT_OTHER): Payer: 59 | Admitting: Physical Therapy

## 2021-10-13 ENCOUNTER — Encounter (HOSPITAL_BASED_OUTPATIENT_CLINIC_OR_DEPARTMENT_OTHER): Payer: Self-pay | Admitting: Physical Therapy

## 2021-10-13 DIAGNOSIS — M5459 Other low back pain: Secondary | ICD-10-CM | POA: Diagnosis not present

## 2021-10-13 DIAGNOSIS — M6281 Muscle weakness (generalized): Secondary | ICD-10-CM

## 2021-10-13 DIAGNOSIS — M25612 Stiffness of left shoulder, not elsewhere classified: Secondary | ICD-10-CM

## 2021-10-13 DIAGNOSIS — M25512 Pain in left shoulder: Secondary | ICD-10-CM

## 2021-10-13 DIAGNOSIS — R262 Difficulty in walking, not elsewhere classified: Secondary | ICD-10-CM

## 2021-10-13 NOTE — Therapy (Signed)
OUTPATIENT PHYSICAL THERAPY THORACOLUMBAR / SHOULDER  TREATMENT NOTE  / PT discharge   Patient Name: Melissa Roth MRN: [id number] DOB:1960/09/02, 61 y.o., female Today's Date: 10/13/2021   PT End of Session - 10/13/21 1438     Visit Number 27    Number of Visits 27    Authorization Type Aetna    PT Start Time 6553    PT Stop Time 1510    PT Time Calculation (min) 34 min    Activity Tolerance Patient tolerated treatment well    Behavior During Therapy WFL for tasks assessed/performed                 Past Medical History:  Diagnosis Date   Back pain    Cervical disc disorder with radiculopathy of cervical region    PONV (postoperative nausea and vomiting)    Sciatica    Vitamin D deficiency    Past Surgical History:  Procedure Laterality Date   ABDOMINAL HYSTERECTOMY     CHOLECYSTECTOMY N/A 06/07/2017   Procedure: LAPAROSCOPIC CHOLECYSTECTOMY;  Surgeon: Judeth Horn, MD;  Location: [place];  Service: General;  Laterality: N/A;   FOOT SURGERY     LUMBAR LAMINECTOMY/DECOMPRESSION MICRODISCECTOMY Left 11/10/2016   Procedure: Left L5-S1 discectomy;  Surgeon: Melina Schools, MD;  Location: [place];  Service: Orthopedics;  Laterality: Left;  120 mins   Patient Active Problem List   Diagnosis Date Noted   Family history of coronary artery disease 12/19/2019   Prediabetes 11/06/2019   Class 1 obesity due to excess calories with body mass index (BMI) of 32.0 to 32.9 in adult 11/05/2019   Bunion 01/19/2019   Pain in right knee 01/02/2019   Cervical radiculopathy 02/07/2018   Chronic pain syndrome 10/03/2017   Closed fracture of second toe of left foot 08/23/2017   DDD (degenerative disc disease), cervical 03/28/2017   Lumbar post-laminectomy syndrome 03/28/2017   Status post lumbar spine surgery for decompression of spinal cord 11/10/2016   Plantar fasciitis 02/03/2016   Left shoulder pain 12/26/2012   Low back pain 09/25/2012   Cervical pain (neck) 09/25/2012     PCP: Marda Stalker, PA-C  REFERRING PROVIDER: Melina Schools, MD / Nuala Alpha, MD  REFERRING DIAG: M54.50 (ICD-10-CM) - Low back pain, unspecified / M25.512 (ICD-10-CM) - Pain in left shoulder  Rationale for Evaluation and Treatment Rehabilitation  THERAPY DIAG:  Other low back pain  Muscle weakness (generalized)  Difficulty walking  Stiffness of left shoulder, not elsewhere classified  Acute pain of left shoulder  ONSET DATE: 05/2021  SUBJECTIVE:  SUBJECTIVE STATEMENT: Pt denies any adverse effects after prior Rx.  Pt states the colder weather has aggravated her back but not as bad as it was prior to therapy.   Pt reports improved lifting objects including groceries and improved laundry in reference to her shoulder.  She also reports improved performance of cleaning her home and her car.  Pt reports she is able to perform hair care without significant difficulty.  Pt feels a twinge in her shoulder with lifting objects onto an overhead shelf and limits her L UE from putting things on an overhead shelf.  Pt has done some of her exercises.        PERTINENT HISTORY:   2018 Lumbar laminectomy/decompression microdiscectomy (Left) Pre-diabetic C/S radic   PAIN:  Are you having pain? yes:  NPRS scale: Shoulder:  Current:  3-4/10, Worst:  4/10,  Best:  1/10  Lumbar: Current:  5-6/10, Worst:  10+/10, Best:  4-5/10  Pain location: see above Pain description: dull, sore  Aggravating factors: using it  Relieving factors:  tylenol     PRECAUTIONS: None  WEIGHT BEARING RESTRICTIONS No  FALLS:  Has patient fallen in last 6 months? No  LIVING ENVIRONMENT: Lives with: lives with their family Lives in: House/apartment Stairs: No Has following equipment at home: None  OCCUPATION:  [place]; at a desk   PLOF: Independent  PATIENT GOALS :   OBJECTIVE:   DIAGNOSTIC FINDINGS:  N/A   TODAY'S TREATMENT  Therapeutic Exercise:  -Reviewed current function, HEP compliance, response to prior Rx, and pain levels.       -Pt performed:  Supine alt UE/LE with TrA 2x10  Supine shoulder ABC x 1 rep   Supine SLR with contralat UE ext with TrA 2x10 reps  ER/IR with RTB/GTB 3 x 10 reps  Rows with GTB 2x10 reps  Paloff press with Tra with RTB 2 x 10 each  PT reviewed and updated HEP.  Pt received an updated HEP and educated pt in correct form and appropriate frequency.        PATIENT EDUCATION:  Education details: exercise form and POC.  Encouraged pt to be compliant with HEP.     Person educated: Patient Education method: explanation, demonstration; verbal and tactile cues; handout Education comprehension: verbalized understanding, returned demonstration, verbal cues required, and tactile cues required   HOME EXERCISE PROGRAM:  Access Code: [email or web address] URL: [email or web address] Date: 08/13/2021 Prepared by: [place]  Updated HEP: - Supine alternating UE/LE  - 1 x daily - 7 x weekly - 2 sets - 10 reps - Supine Shoulder Alphabet  - 1 x daily - 5 x weekly - 1 reps - Shoulder External Rotation with Anchored Resistance  - 1 x daily - 3-4 x weekly - 2-3 sets - 10 reps - Shoulder Internal Rotation with Resistance  - 1 x daily - 3-4 x weekly - 2-3 sets - 10 reps - Standing Shoulder Row with Anchored Resistance  - 1 x daily - 4-5 x weekly - 2 sets - 10 reps - Standing Anti-Rotation Press with Anchored Resistance  - 1 x daily - 4 x weekly - 2 sets - 10 reps   ASSESSMENT:  CLINICAL IMPRESSION: Pt has made great progress relating to her shoulder though seems to have plateaued in lumbar.  Though she continues to have lumbar pain, she has improved overall with her lumbar.  She reports having increased lumbar pain with weather  change though states it wasn't as  bad as it used to be.  Pt is not compliant with HEP though has been performing some of her HEP.  PT updated HEP and gave pt a HEP handout.  PT encouraged pt to be compliant with HEP.  Pt demonstrated good form with HEP and is independent with long term HEP.  She met LTG #1.   For progress for other goals, please see below.  Pt has land based and aquatic home programs.  Pt is ready for discharge due to reaching her maximum functional potential at this time.        OBJECTIVE IMPAIRMENTS Abnormal gait, decreased activity tolerance, decreased balance, decreased mobility, difficulty walking, decreased ROM, decreased strength, hypomobility, increased muscle spasms, impaired flexibility, impaired sensation, improper body mechanics, postural dysfunction, and pain.   ACTIVITY LIMITATIONS carrying, lifting, bending, sitting, standing, squatting, sleeping, stairs, transfers, and locomotion level  PARTICIPATION LIMITATIONS: cleaning, laundry, interpersonal relationship, driving, shopping, community activity, yard work, and exercise  PERSONAL FACTORS Age, Fitness, Time since onset of injury/illness/exacerbation, and 1-2 comorbidities:    are also affecting patient's functional outcome.   REHAB POTENTIAL: Fair    CLINICAL DECISION MAKING: Evolving/moderate complexity  EVALUATION COMPLEXITY: Moderate   GOALS:  SHORT TERM GOALS: Target date: 08/20/2021  Pt will become independent with HEP in order to demonstrate synthesis of PT education.   Goal status: GOAL MET  2.  Pt will report at least 2 pt reduction on NPRS scale for pain in order to demonstrate functional improvement with household activity, self care, and ADL.   Goal status: Achieved - 07/21/21  3.   Pt will score at least 3 pt increase on FOTO to demonstrate functional improvement in MCII and pt perceived function.    Goal status: NOT MET  4.  Pain in L shoulder to be no more than 3/10 at worst  Baseline:  7/10 at worst - 10/2 Goal status: PARTIALLY MET  5.  Will have better understanding of posture and ergonomics  Baseline:  Goal status: NOT ASSESSED      LONG TERM GOALS: Target date:  10/19/2021   Pt  will become independent with final HEP in order to demonstrate synthesis of PT education.  Goal status: GOAL MET  2.  Pt will score >/= 63 on FOTO to demonstrate improvement in perceived lumbar function.  Goal status: NOT MET FOR LUMBAR, MET FOR SHOULDER  3.  Pt will be able to perform 5XSTS in under 12s  in order to demonstrate functional improvement above the cut off score for adults.   Goal status: GOAL MET  4.  Pt will be able to demonstrate/report ability to sit/stand/sleep for extended periods of time without pain in order to demonstrate functional improvement and tolerance to static positioning.    Goal status: ONGOING  5.  AROM in L shoulder will be within 10 degrees of R shoulder in all directions  Baseline:  Goal status: 90% MET  6.  Will be able to use L UE for dressing/ADLs/hygiene without increase in pain  Baseline:  Goal status: Achieved 8/21  7.  Will be able to reach overhead to turn a fan on/off or put a light object on a shelf overhead with LUE without increase in pain  Baseline:  Goal status: PARTIALLY MET  8.  Pt will perform hair care without significant difficulty.     Baseline:   Goal Status:  GOAL MET  10/2       PLAN: PT FREQUENCY: 2 visits  PT DURATION:  2 weeks  PLANNED INTERVENTIONS: Therapeutic exercises, Therapeutic activity, Neuromuscular re-education, Balance training, Gait training, Patient/Family education, Joint manipulation, Joint mobilization, Stair training, Orthotic/Fit training, DME instructions, Aquatic Therapy, Dry Needling, Electrical stimulation, Spinal manipulation, Spinal mobilization, Cryotherapy, Moist heat, scar mobilization, Splintting, Taping, Vasopneumatic device, Traction, Ultrasound, Ionotophoresis 9m/ml  Dexamethasone, Manual therapy, and Re-evaluation.  PLAN FOR NEXT SESSION:   Pt to be discharged from skilled PT services due to reaching maximum functional potential at this time.  She is agreeable with discharge and will cont with land based and aquatic HEP.  PHYSICAL THERAPY DISCHARGE SUMMARY  Visits from Start of Care: 27  Current functional level related to goals / functional outcomes: See above   Remaining deficits: See above   Education / Equipment: Pt has HEP       RSelinda MichaelsIII PT, DPT 10/13/21 5:25 PM

## 2021-11-02 ENCOUNTER — Other Ambulatory Visit: Payer: Self-pay | Admitting: Obstetrics and Gynecology

## 2021-11-02 DIAGNOSIS — Z1231 Encounter for screening mammogram for malignant neoplasm of breast: Secondary | ICD-10-CM

## 2021-11-03 ENCOUNTER — Ambulatory Visit (INDEPENDENT_AMBULATORY_CARE_PROVIDER_SITE_OTHER): Payer: 59 | Admitting: Podiatry

## 2021-11-03 DIAGNOSIS — M722 Plantar fascial fibromatosis: Secondary | ICD-10-CM

## 2021-11-03 MED ORDER — MELOXICAM 15 MG PO TABS: 15.0000 mg | ORAL_TABLET | Freq: Every day | ORAL | 0 refills | Status: AC | PRN

## 2021-11-03 MED ORDER — TRIAMCINOLONE ACETONIDE 10 MG/ML IJ SUSP
10.0000 mg | Freq: Once | INTRAMUSCULAR | Status: AC
  Administered 2021-11-03: 10 mg

## 2021-11-03 NOTE — Patient Instructions (Signed)
Look at [facility] and [employer]   While at your visit today you received a steroid injection in your foot or ankle to help with your pain. Along with having the steroid medication there is some "numbing" medication in the shot that you received. Due to this you may notice some numbness to the area for the next couple of hours.   I would recommend limiting activity for the next few days to help the steroid injection take affect.    The actually benefit from the steroid injection may take up to 2-7 days to see a difference. You may actually experience a small (as in 10%) INCREASE in pain in the first 24 hours---that is common. It would be best if you can ice the area today and take anti-inflammatory medications (such as Ibuprofen, Motrin, or Aleve) if you are able to take these medications. If you were prescribed another medication to help with the pain go ahead and start that medication today    Things to watch out for that you should contact us or a health care provider urgently would include: 1. Unusual (as in more than 10%) increase in pain 2. New fever > 101.5 3. New swelling or redness of the injected area.  4. Streaking of red lines around the area injected.  If you have any questions or concerns about this, please give our office a call at [phone].    Plantar Fasciitis (Heel Spur Syndrome) with Rehab The plantar fascia is a fibrous, ligament-like, soft-tissue structure that spans the bottom of the foot. Plantar fasciitis is a condition that causes pain in the foot due to inflammation of the tissue. SYMPTOMS  Pain and tenderness on the underneath side of the foot. Pain that worsens with standing or walking. CAUSES  Plantar fasciitis is caused by irritation and injury to the plantar fascia on the underneath side of the foot. Common mechanisms of injury include: Direct trauma to bottom of the foot. Damage to a small nerve that runs under the foot where the main fascia attaches to  the heel bone. Stress placed on the plantar fascia due to bone spurs. RISK INCREASES WITH:  Activities that place stress on the plantar fascia (running, jumping, pivoting, or cutting). Poor strength and flexibility. Improperly fitted shoes. Tight calf muscles. Flat feet. Failure to warm-up properly before activity. Obesity. PREVENTION Warm up and stretch properly before activity. Allow for adequate recovery between workouts. Maintain physical fitness: Strength, flexibility, and endurance. Cardiovascular fitness. Maintain a health body weight. Avoid stress on the plantar fascia. Wear properly fitted shoes, including arch supports for individuals who have flat feet.  PROGNOSIS  If treated properly, then the symptoms of plantar fasciitis usually resolve without surgery. However, occasionally surgery is necessary.  RELATED COMPLICATIONS  Recurrent symptoms that may result in a chronic condition. Problems of the lower back that are caused by compensating for the injury, such as limping. Pain or weakness of the foot during push-off following surgery. Chronic inflammation, scarring, and partial or complete fascia tear, occurring more often from repeated injections.  TREATMENT  Treatment initially involves the use of ice and medication to help reduce pain and inflammation. The use of strengthening and stretching exercises may help reduce pain with activity, especially stretches of the Achilles tendon. These exercises may be performed at home or with a therapist. Your caregiver may recommend that you use heel cups of arch supports to help reduce stress on the plantar fascia. Occasionally, corticosteroid injections are given to reduce inflammation. If  symptoms persist for greater than 6 months despite non-surgical (conservative), then surgery may be recommended.   MEDICATION  If pain medication is necessary, then nonsteroidal anti-inflammatory medications, such as aspirin and ibuprofen, or  other minor pain relievers, such as acetaminophen, are often recommended. Do not take pain medication within 7 days before surgery. Prescription pain relievers may be given if deemed necessary by your caregiver. Use only as directed and only as much as you need. Corticosteroid injections may be given by your caregiver. These injections should be reserved for the most serious cases, because they may only be given a certain number of times.  HEAT AND COLD Cold treatment (icing) relieves pain and reduces inflammation. Cold treatment should be applied for 10 to 15 minutes every 2 to 3 hours for inflammation and pain and immediately after any activity that aggravates your symptoms. Use ice packs or massage the area with a piece of ice (ice massage). Heat treatment may be used prior to performing the stretching and strengthening activities prescribed by your caregiver, physical therapist, or athletic trainer. Use a heat pack or soak the injury in warm water.  SEEK IMMEDIATE MEDICAL CARE IF: Treatment seems to offer no benefit, or the condition worsens. Any medications produce adverse side effects.  EXERCISES- RANGE OF MOTION (ROM) AND STRETCHING EXERCISES - Plantar Fasciitis (Heel Spur Syndrome) These exercises may help you when beginning to rehabilitate your injury. Your symptoms may resolve with or without further involvement from your physician, physical therapist or athletic trainer. While completing these exercises, remember:  Restoring tissue flexibility helps normal motion to return to the joints. This allows healthier, less painful movement and activity. An effective stretch should be held for at least 30 seconds. A stretch should never be painful. You should only feel a gentle lengthening or release in the stretched tissue.  RANGE OF MOTION - Toe Extension, Flexion Sit with your right / left leg crossed over your opposite knee. Grasp your toes and gently pull them back toward the top of your  foot. You should feel a stretch on the bottom of your toes and/or foot. Hold this stretch for 10 seconds. Now, gently pull your toes toward the bottom of your foot. You should feel a stretch on the top of your toes and or foot. Hold this stretch for 10 seconds. Repeat  times. Complete this stretch 3 times per day.   RANGE OF MOTION - Ankle Dorsiflexion, Active Assisted Remove shoes and sit on a chair that is preferably not on a carpeted surface. Place right / left foot under knee. Extend your opposite leg for support. Keeping your heel down, slide your right / left foot back toward the chair until you feel a stretch at your ankle or calf. If you do not feel a stretch, slide your bottom forward to the edge of the chair, while still keeping your heel down. Hold this stretch for 10 seconds. Repeat 3 times. Complete this stretch 2 times per day.   STRETCH  Gastroc, Standing Place hands on wall. Extend right / left leg, keeping the front knee somewhat bent. Slightly point your toes inward on your back foot. Keeping your right / left heel on the floor and your knee straight, shift your weight toward the wall, not allowing your back to arch. You should feel a gentle stretch in the right / left calf. Hold this position for 10 seconds. Repeat 3 times. Complete this stretch 2 times per day.  STRETCH  Soleus, Standing Place hands  on wall. Extend right / left leg, keeping the other knee somewhat bent. Slightly point your toes inward on your back foot. Keep your right / left heel on the floor, bend your back knee, and slightly shift your weight over the back leg so that you feel a gentle stretch deep in your back calf. Hold this position for 10 seconds. Repeat 3 times. Complete this stretch 2 times per day.  STRETCH  Gastrocsoleus, Standing  Note: This exercise can place a lot of stress on your foot and ankle. Please complete this exercise only if specifically instructed by your caregiver.  Place  the ball of your right / left foot on a step, keeping your other foot firmly on the same step. Hold on to the wall or a rail for balance. Slowly lift your other foot, allowing your body weight to press your heel down over the edge of the step. You should feel a stretch in your right / left calf. Hold this position for 10 seconds. Repeat this exercise with a slight bend in your right / left knee. Repeat 3 times. Complete this stretch 2 times per day.   STRENGTHENING EXERCISES - Plantar Fasciitis (Heel Spur Syndrome)  These exercises may help you when beginning to rehabilitate your injury. They may resolve your symptoms with or without further involvement from your physician, physical therapist or athletic trainer. While completing these exercises, remember:  Muscles can gain both the endurance and the strength needed for everyday activities through controlled exercises. Complete these exercises as instructed by your physician, physical therapist or athletic trainer. Progress the resistance and repetitions only as guided.  STRENGTH - Towel Curls Sit in a chair positioned on a non-carpeted surface. Place your foot on a towel, keeping your heel on the floor. Pull the towel toward your heel by only curling your toes. Keep your heel on the floor. Repeat 3 times. Complete this exercise 2 times per day.  STRENGTH - Ankle Inversion Secure one end of a rubber exercise band/tubing to a fixed object (table, pole). Loop the other end around your foot just before your toes. Place your fists between your knees. This will focus your strengthening at your ankle. Slowly, pull your big toe up and in, making sure the band/tubing is positioned to resist the entire motion. Hold this position for 10 seconds. Have your muscles resist the band/tubing as it slowly pulls your foot back to the starting position. Repeat 3 times. Complete this exercises 2 times per day.  Document Released: 12/21/2004 Document Revised:  03/15/2011 Document Reviewed: 04/04/2008 [facility] Patient Information 2014 [place], [place].

## 2021-11-03 NOTE — Progress Notes (Signed)
Subjective: Chief Complaint  Patient presents with   Plantar Fasciitis    Bilateral plantar fasciitis, heel and arch, patient is still having pain, rate of pain 5 out of 10, TX: stretching, patient would like to proceed with with another steroid injection today     61 year old female presents the above concerns.  The right side is worse than the left. She has been stretching, not incing. No injuries or falls. No swelling, numbness or tingling. She notices it when she walks or when she first gets up she has pain to the heel.   Objective: AAO x3, NAD DP/PT pulses palpable bilaterally, CRT less than 3 seconds Tenderness to palpation along the plantar medial tubercle of the calcaneus at the insertion of plantar fascia on the left and right foot. There is no pain along the course of the plantar fascia within the arch of the foot. Plantar fascia appears to be intact. There is no pain with lateral compression of the calcaneus or pain with vibratory sensation. There is no pain along the course or insertion of the achilles tendon. No other areas of tenderness to bilateral lower extremities. No pain with calf compression, swelling, warmth, erythema  Assessment: Lateral heel pain, Plantar fasciitis  Plan: -All treatment options discussed with the patient including all alternatives, risks, complications.  -Steroid injection performed.  See procedure note below. -Prescribed mobic. Discussed side effects of the medication and directed to stop if any are to occur and call the office.  -Continue stretching, icing a regular basis and continue shoes and good arch support. -Patient encouraged to call the office with any questions, concerns, change in symptoms.   Procedure: Injection Tendon/Ligament Discussed alternatives, risks, complications and verbal consent was obtained.  Location: Bilateral plantar fascia at the glabrous junction; medial approach. Skin Prep: Alcohol. Injectate: 0.5cc 0.5% marcaine  plain, 0.5 cc 2% lidocaine plain and, 1 cc kenalog 10. Disposition: Patient tolerated procedure well. Injection site dressed with a band-aid.  Post-injection care was discussed and return precautions discussed.   Trula Slade DPM

## 2021-12-17 ENCOUNTER — Ambulatory Visit: Payer: 59 | Admitting: Internal Medicine

## 2021-12-17 NOTE — Progress Notes (Deleted)
Cardiology Office Note:    Date:  12/17/2021   ID:  Melissa Roth, DOB 1960/11/28, MRN [id number]  PCP:  Jarrett Soho, PA-C  [facility] Cardiologist:  Christell Constant, MD  [facility] Electrophysiologist:  None   CC: Prevention follow up  History of Present Illness:    Melissa Roth is a 61 y.o. female  Obesity, who presents for evaluation. In interim of this visit, patient had a calcium score of 0.  Seen 12/19/20.  Patient notes that she is doing great.  She has labs required for work and had an increased waist circumference, elevated MPO, and elevated CRP.  LDL has dropped to 46.  She comes with questions about what to do .    No chest pain or pressure .  No SOB/DOE and no PND/Orthopnea.  No weight gain or leg swelling.  No palpitations or syncope .  Leg pain from prior has resolved.   Past Medical History:  Diagnosis Date   Back pain    Cervical disc disorder with radiculopathy of cervical region    PONV (postoperative nausea and vomiting)    Sciatica    Vitamin D deficiency     Past Surgical History:  Procedure Laterality Date   ABDOMINAL HYSTERECTOMY     CHOLECYSTECTOMY N/A 06/07/2017   Procedure: LAPAROSCOPIC CHOLECYSTECTOMY;  Surgeon: Jimmye Norman, MD;  Location: [facility] OR;  Service: General;  Laterality: N/A;   FOOT SURGERY     LUMBAR LAMINECTOMY/DECOMPRESSION MICRODISCECTOMY Left 11/10/2016   Procedure: Left L5-S1 discectomy;  Surgeon: Venita Lick, MD;  Location: [facility] OR;  Service: Orthopedics;  Laterality: Left;  120 mins    Current Medications: No outpatient medications have been marked as taking for the 12/17/21 encounter (Appointment) with Christell Constant, MD.     Allergies:   Iodinated contrast media and Sulfa antibiotics   Social History   Socioeconomic History   Marital status: Widowed    Spouse name: Not on file   Number of children: Not on file   Years of education: Not on file   Highest education level: Not on file   Occupational History   Occupation: Scientist, research (physical sciences) Rep  Tobacco Use   Smoking status: Never   Smokeless tobacco: Never  Vaping Use   Vaping Use: Never used  Substance and Sexual Activity   Alcohol use: Yes    Comment: occ   Drug use: No   Sexual activity: Not on file  Other Topics Concern   Not on file  Social History Narrative   Not on file   Social Determinants of Health   Financial Resource Strain: Not on file  Food Insecurity: Not on file  Transportation Needs: Not on file  Physical Activity: Not on file  Stress: Not on file  Social Connections: Not on file    Social:  Husband died last year from Covid Mother is battle breast cancer Client service rep for [employer]  Family History: The patient's family history includes Alcoholism in her father; Heart attack in her father; Hyperlipidemia in her brother, father, and mother; Hypertension in her brother, father, and mother; Obesity in her mother; Sleep apnea in her mother; Sudden death in her father. There is no history of Diabetes.  History of coronary artery disease notable for father (has HTN). History of heart failure notable for father. No history of cardiomyopathies including hypertrophic cardiomyopathy, left ventricular non-compaction, or arrhythmogenic right ventricular cardiomyopathy. History of arrhythmia notable for no members. Denies family history of sudden  cardiac death including drowning, car accidents, or unexplained deaths in the family (her father had MI). No history of bicuspid aortic valve or aortic aneurysm or dissection.  ROS:   Please see the history of present illness.     All other systems reviewed and are negative.  EKGs/Labs/Other Studies Reviewed:    The following studies were reviewed today: EKG: 12/19/20: SR rate 66 WNL- no changes 11/05/19: SR 64 WNL  Recent Labs: No results found for requested labs within last 365 days.  Recent Lipid Panel    Component Value Date/Time    CHOL 155 11/05/2019 0818   TRIG 71 11/05/2019 0818   HDL 84 11/05/2019 0818   LDLCALC 57 11/05/2019 0818   Hgb 12.8 HCT 37.4 Plt 259 AST/ALT 24/27 Creatinine 0.69 TSH 1.85  Risk Assessment/Calculations:     The 10-year ASCVD risk score (Arnett DK, et al., 2019) is: 4.4%   Values used to calculate the score:     Age: 89 years     Sex: Female     Is Non-Hispanic African American: Yes     Diabetic: No     Tobacco smoker: No     Systolic Blood Pressure: 123456 mmHg     Is BP treated: No     HDL Cholesterol: 84 mg/dL     Total Cholesterol: 155 mg/dL  Physical Exam:    VS:  There were no vitals taken for this visit.    Wt Readings from Last 3 Encounters:  07/24/21 189 lb (85.7 kg)  12/19/20 191 lb 12.8 oz (87 kg)  01/10/20 187 lb (84.8 kg)    Gen: no distress  Neck: No JVD Ears:  Pilar Plate Sign Cardiac: No Rubs or Gallops, no Murmur, normal rhythm and +2 radial pulses Respiratory: Clear to auscultation bilaterally, normal effort, normal  respiratory rate GI: Soft, nontender, non-distended  MS: No  edema;  moves all extremities Integument: Skin feels warm Neuro:  At time of evaluation, alert and oriented to person/place/time/situation  Psych: Normal affect, patient feels well   ASSESSMENT:    No diagnosis found.   PLAN:    Cardiac Preventive Visit Family history of coronary artery disease Elevated HS-CRP (10) Elevated MPO AHA Life's Simple Seven- People with at least five ideal Life's Simple 7 metrics had a 78% reduced risk for heart-related death compared to people with no ideal metrics. - Discussed Decreasing bread and dietary sugars - Discussed recommendations for 150 minus weekly exercise - Discussed the important of blood sugar and blood pressure control - Reviewed AMP BP; under 130/80.   - though BMI is not a perfect measurement in all patient populations; a BMI < 30 can improve cardiac outcomes; present BMI is  still 32; we discussed WC as a marker of metabolic  syndrome - LODOCO - 0 CAC score checked 2022 - discussed mindfulness medication.    0.5 mg of colchicine once daily   One year follow up with me      Medication Adjustments/Labs and Tests Ordered: Current medicines are reviewed at length with the patient today.  Concerns regarding medicines are outlined above.  No orders of the defined types were placed in this encounter.   No orders of the defined types were placed in this encounter.    There are no Patient Instructions on file for this visit.   Signed, Werner Lean, MD  12/17/2021 8:46 AM    [place]

## 2021-12-21 NOTE — Progress Notes (Unsigned)
Office Visit    Patient Name: Melissa Roth Date of Encounter: 12/21/2021  Primary Care Provider:  Marda Stalker, PA-C Primary Cardiologist:  Werner Lean, MD Primary Electrophysiologist: None  Chief Complaint    Melissa Roth is a 61 y.o. female with PMH of strong family history of CAD who presents for 1 year follow-up. Past Medical History    Past Medical History:  Diagnosis Date   Back pain    Cervical disc disorder with radiculopathy of cervical region    PONV (postoperative nausea and vomiting)    Sciatica    Vitamin D deficiency    Past Surgical History:  Procedure Laterality Date   ABDOMINAL HYSTERECTOMY     CHOLECYSTECTOMY N/A 06/07/2017   Procedure: LAPAROSCOPIC CHOLECYSTECTOMY;  Surgeon: Judeth Horn, MD;  Location: [facility];  Service: General;  Laterality: N/A;   FOOT SURGERY     LUMBAR LAMINECTOMY/DECOMPRESSION MICRODISCECTOMY Left 11/10/2016   Procedure: Left L5-S1 discectomy;  Surgeon: Melina Schools, MD;  Location: [facility];  Service: Orthopedics;  Laterality: Left;  120 mins    Allergies  Allergies  Allergen Reactions   Iodinated Contrast Media Hives   Sulfa Antibiotics Hives    History of Present Illness    Melissa Roth  is a 61 year old female with the above mention past medical history who presents today for follow-up of family history of coronary artery disease.  She was initially seen by Dr. Harmon Pier and 12/2019 due to strong family history of coronary artery disease.  Her 10-year ASCVD risk score was 1.8%.  She had complained of atypical leg pain and ABI and duplex were ordered that revealed no evidence of left lower extremity DVT.  She was seen the previous year on 12/2020 and had previous calcium scoring completed 01/2020 that revealed score of 0 and normal coronaries.  She was otherwise doing well and blood pressure and lipids both WNL.  Since last being seen in the office patient reports***.  Patient denies chest pain,  palpitations, dyspnea, PND, orthopnea, nausea, vomiting, dizziness, syncope, edema, weight gain, or early satiety.  ***Notes:  Home Medications    Current Outpatient Medications  Medication Sig Dispense Refill   baclofen (LIORESAL) 10 MG tablet as needed.     benzonatate (TESSALON) 100 MG capsule Take 100 mg by mouth as needed.     buPROPion (WELLBUTRIN XL) 150 MG 24 hr tablet Take 150 mg by mouth as needed.     cephALEXin (KEFLEX) 500 MG capsule Take 1 capsule (500 mg total) by mouth 2 (two) times daily. 10 capsule 0   diclofenac (VOLTAREN) 75 MG EC tablet as needed.     diphenhydrAMINE (BENADRYL) 50 MG tablet Take 1 tablet (50 mg total) by mouth once for 1 dose. Pt to take 50 mg of benadryl on 12/05/20 at 7 AM. Please call [phone] with any questions. (Patient not taking: Reported on 12/19/2020) 1 tablet 0   diphenhydrAMINE (BENADRYL) 50 MG tablet Take 1 tablet (50 mg total) by mouth once for 1 dose. Pt is to take 50 mg of benadryl on 04/10/21 at 7:00 AM. Please call [phone] with any questions. 1 tablet 0   estradiol (ESTRACE) 0.1 MG/GM vaginal cream as needed.     fluconazole (DIFLUCAN) 150 MG tablet Take by mouth as needed.     gabapentin (NEURONTIN) 300 MG capsule Take 300 mg by mouth as needed.     HYDROcodone-acetaminophen (NORCO/VICODIN) 5-325 MG tablet as needed.     ibuprofen (  ADVIL) 600 MG tablet Take 1 tablet (600 mg total) by mouth every 8 (eight) hours as needed for up to 21 doses for mild pain or moderate pain. 21 tablet 0   ipratropium (ATROVENT) 0.03 % nasal spray as needed.     meloxicam (MOBIC) 15 MG tablet Take 1 tablet (15 mg total) by mouth daily as needed for pain. 14 tablet 0   methocarbamol (ROBAXIN) 500 MG tablet as needed.     Multiple Vitamins-Minerals (MULTIVITAMIN WITH MINERALS) tablet Take 1 tablet by mouth daily.     nitrofurantoin, macrocrystal-monohydrate, (MACROBID) 100 MG capsule Take 100 mg by mouth 2 (two) times daily. (Patient not taking: Reported  on 12/19/2020)     oxyCODONE-acetaminophen (PERCOCET/ROXICET) 5-325 MG tablet Take 1 tablet by mouth every 6 (six) hours as needed for up to 10 doses for severe pain. 10 tablet 0   thiamine (VITAMIN B-1) 100 MG tablet Take 100 mg by mouth daily.     valACYclovir (VALTREX) 500 MG tablet as needed.     Vitamin D, Ergocalciferol, (DRISDOL) 1.25 MG (50000 UNIT) CAPS capsule Take 1 capsule (50,000 Units total) by mouth every 7 (seven) days. 5 capsule 0   No current facility-administered medications for this visit.     Review of Systems  Please see the history of present illness.    (+)*** (+)***  All other systems reviewed and are otherwise negative except as noted above.  Physical Exam    Wt Readings from Last 3 Encounters:  07/24/21 189 lb (85.7 kg)  12/19/20 191 lb 12.8 oz (87 kg)  01/10/20 187 lb (84.8 kg)   QM:GQQPY were no vitals filed for this visit.,There is no height or weight on file to calculate BMI.  Constitutional:      Appearance: Healthy appearance. Not in distress.  Neck:     Vascular: JVD normal.  Pulmonary:     Effort: Pulmonary effort is normal.     Breath sounds: No wheezing. No rales. Diminished in the bases Cardiovascular:     Normal rate. Regular rhythm. Normal S1. Normal S2.      Murmurs: There is no murmur.  Edema:    Peripheral edema absent.  Abdominal:     Palpations: Abdomen is soft non tender. There is no hepatomegaly.  Skin:    General: Skin is warm and dry.  Neurological:     General: No focal deficit present.     Mental Status: Alert and oriented to person, place and time.     Cranial Nerves: Cranial nerves are intact.  EKG/LABS/Other Studies Reviewed    ECG personally reviewed by me today - ***  Risk Assessment/Calculations:   {Does this patient have ATRIAL FIBRILLATION?:[phone]}        Lab Results  Component Value Date   WBC 5.3 06/06/2017   HGB 12.6 06/06/2017   HCT 39.6 06/06/2017   MCV 95.7 06/06/2017   PLT 285 06/06/2017    Lab Results  Component Value Date   CREATININE 0.69 11/05/2019   BUN 10 11/05/2019   NA 139 11/05/2019   K 4.2 11/05/2019   CL 103 11/05/2019   CO2 27 11/05/2019   Lab Results  Component Value Date   ALT 40 (H) 11/05/2019   AST 32 11/05/2019   ALKPHOS 109 11/05/2019   BILITOT 0.2 11/05/2019   Lab Results  Component Value Date   CHOL 155 11/05/2019   HDL 84 11/05/2019   LDLCALC 57 11/05/2019   TRIG 71 11/05/2019  Lab Results  Component Value Date   HGBA1C 5.7 (H) 11/05/2019    Assessment & Plan    1.  Family history of CAD: -Continue your ASCVD score was 2.4% -Calcium score was completed with score of 0  2.***  3.***  4.***      Disposition: Follow-up with Werner Lean, MD or APP in *** months {Are you ordering a CV Procedure (e.g. stress test, cath, DCCV, TEE, etc)?   Press F2        :[id number]   Medication Adjustments/Labs and Tests Ordered: Current medicines are reviewed at length with the patient today.  Concerns regarding medicines are outlined above.   Signed, Mable Fill, Marissa Nestle, NP 12/21/2021, 8:15 PM [facility]  Note:  This document was prepared using Dragon voice recognition software and may include unintentional dictation errors.

## 2021-12-22 ENCOUNTER — Encounter: Payer: Self-pay | Admitting: Nurse Practitioner

## 2021-12-22 ENCOUNTER — Ambulatory Visit: Payer: 59 | Attending: Orthopedic Surgery | Admitting: Nurse Practitioner

## 2021-12-22 VITALS — BP 112/72 | HR 63 | Ht 64.0 in | Wt 186.4 lb

## 2021-12-22 DIAGNOSIS — M5412 Radiculopathy, cervical region: Secondary | ICD-10-CM | POA: Insufficient documentation

## 2021-12-22 DIAGNOSIS — Z8249 Family history of ischemic heart disease and other diseases of the circulatory system: Secondary | ICD-10-CM | POA: Insufficient documentation

## 2021-12-22 DIAGNOSIS — M5030 Other cervical disc degeneration, unspecified cervical region: Secondary | ICD-10-CM | POA: Diagnosis not present

## 2021-12-22 NOTE — Patient Instructions (Signed)
Medication Instructions:  Your physician recommends that you continue on your current medications as directed. Please refer to the Current Medication list given to you today.  *If you need a refill on your cardiac medications before your next appointment, please call your pharmacy*   Lab Work: None ordered  If you have labs (blood work) drawn today and your tests are completely normal, you will receive your results only by: MyChart Message (if you have MyChart) OR A paper copy in the mail If you have any lab test that is abnormal or we need to change your treatment, we will call you to review the results.   Testing/Procedures: None ordered   Follow-Up: At [facility], you and your health needs are our priority.  As part of our continuing mission to provide you with exceptional heart care, we have created designated Provider Care Teams.  These Care Teams include your primary Cardiologist (physician) and Advanced Practice Providers (APPs -  Physician Assistants and Nurse Practitioners) who all work together to provide you with the care you need, when you need it.  We recommend signing up for the patient portal called "MyChart".  Sign up information is provided on this After Visit Summary.  MyChart is used to connect with patients for Virtual Visits (Telemedicine).  Patients are able to view lab/test results, encounter notes, upcoming appointments, etc.  Non-urgent messages can be sent to your provider as well.   To learn more about what you can do with MyChart, go to [email or web address].    Your next appointment:   1 year(s)  The format for your next appointment:   In Person  Provider:   Christell Constant, MD     Other Instructions   Important Information About Sugar

## 2022-02-15 ENCOUNTER — Ambulatory Visit
Admission: RE | Admit: 2022-02-15 | Discharge: 2022-02-15 | Disposition: A | Discharge status: Home / Self Care | Payer: 59 | Source: Ambulatory Visit | Attending: Obstetrics and Gynecology | Admitting: Obstetrics and Gynecology

## 2022-02-15 DIAGNOSIS — Z1231 Encounter for screening mammogram for malignant neoplasm of breast: Secondary | ICD-10-CM

## 2022-02-22 ENCOUNTER — Ambulatory Visit: Payer: 59 | Admitting: Podiatry

## 2022-03-01 ENCOUNTER — Ambulatory Visit (INDEPENDENT_AMBULATORY_CARE_PROVIDER_SITE_OTHER): Payer: 59 | Admitting: Podiatry

## 2022-03-01 DIAGNOSIS — M722 Plantar fascial fibromatosis: Secondary | ICD-10-CM

## 2022-03-01 MED ORDER — TRIAMCINOLONE ACETONIDE 10 MG/ML IJ SUSP
10.0000 mg | Freq: Once | INTRAMUSCULAR | Status: AC
  Administered 2022-03-01: 10 mg

## 2022-03-01 NOTE — Progress Notes (Signed)
Subjective: Chief Complaint  Patient presents with   Plantar Fasciitis    PF, pain more in R foot    62 year old female presents the above concerns.  She said the heel started hurting again and she also has pain along the area of the bunion on the left foot at times.  No injuries that she recalls.  She is still confined to the home stretch exercises as well as wearing good supportive shoes.   Objective: AAO x3, NAD DP/PT pulses palpable bilaterally, CRT less than 3 seconds Tenderness to palpation along the plantar aspect of the calcaneus at the insertion of plantar fascia on the left and right foot. There is no pain along the course of the plantar fascia within the arch of the foot. Plantar fascia appears to be intact. There is no pain with lateral compression of the calcaneus or pain with vibratory sensation. There is no pain along the course or insertion of the achilles tendon. No other areas of tenderness to bilateral lower extremities. Mild bunion present left foot mild tenderness palpation along the first metatarsal head. No pain with calf compression, swelling, warmth, erythema  Assessment: Bilateral heel pain, Plantar fasciitis; left foot bunion  Plan: -All treatment options discussed with the patient including all alternatives, risks, complications.  -We discussed different conservative treatments including physical therapy, EPAT as well as injection therapy.  She wants to proceed with injections today. -Steroid injection performed.  See procedure note below. -Anti-inflammatories as needed -Offloading for the bunion. -Continue stretching, icing a regular basis and continue shoes and good arch support. -Patient encouraged to call the office with any questions, concerns, change in symptoms.   Procedure: Injection Tendon/Ligament Discussed alternatives, risks, complications and verbal consent was obtained.  Location: Bilateral plantar fascia at the glabrous junction; medial  approach. Skin Prep: Alcohol. Injectate: 0.5cc 0.5% marcaine plain, 0.5 cc 2% lidocaine plain and, 1 cc kenalog 10. Disposition: Patient tolerated procedure well. Injection site dressed with a band-aid.  Post-injection care was discussed and return precautions discussed.   Trula Slade DPM   Left bunion pain Sometimes fee irritated with certain shoes, pointing to the top Right heel she can feel it with pressure.  AM pain, has NS she think

## 2022-03-01 NOTE — Patient Instructions (Signed)

## 2022-04-20 ENCOUNTER — Ambulatory Visit
Admission: EM | Admit: 2022-04-20 | Discharge: 2022-04-20 | Disposition: A | Discharge status: Home / Self Care | Payer: 59 | Attending: Family Medicine | Admitting: Family Medicine

## 2022-04-20 DIAGNOSIS — N3090 Cystitis, unspecified without hematuria: Secondary | ICD-10-CM | POA: Diagnosis present

## 2022-04-20 LAB — POCT URINALYSIS DIP (MANUAL ENTRY)
Bilirubin, UA: NEGATIVE
Glucose, UA: NEGATIVE mg/dL
Ketones, POC UA: NEGATIVE mg/dL
Nitrite, UA: NEGATIVE
Protein Ur, POC: 30 mg/dL — AB
Spec Grav, UA: >=1.03 — AB (ref 1.010–1.025)
Urobilinogen, UA: 0.2 E.U./dL
pH, UA: 5.5 (ref 5.0–8.0)

## 2022-04-20 MED ORDER — CEPHALEXIN 500 MG PO CAPS: 500.0000 mg | ORAL_CAPSULE | Freq: Two times a day (BID) | ORAL | 0 refills | Status: DC

## 2022-04-20 NOTE — Discharge Instructions (Signed)
You have had labs (urine culture) sent today. We will call you with any significant abnormalities or if there is need to begin or change treatment or pursue further follow up.  You may also review your test results online through MyChart. If you do not have a MyChart account, instructions to sign up should be on your discharge paperwork.  

## 2022-04-20 NOTE — ED Triage Notes (Signed)
Pt reports she has pressure when urinating, frequent urination x 2 days.  Took azo

## 2022-04-21 NOTE — ED Provider Notes (Signed)
[facility]    ASSESSMENT & PLAN:  1. Cystitis    Begin: Meds ordered this encounter  Medications   cephALEXin (KEFLEX) 500 MG capsule    Sig: Take 1 capsule (500 mg total) by mouth 2 (two) times daily.    Dispense:  10 capsule    Refill:  0   No signs of pyelonephritis. Urine culture sent. Will notify patient of any significant results. Will follow up with her PCP or here if not showing improvement over the next 48 hours, sooner if needed.  Outlined signs and symptoms indicating need for more acute intervention. Patient verbalized understanding. After Visit Summary given.  SUBJECTIVE:  Melissa Roth is a 62 y.o. female who complains of urinary frequency, urgency and dysuria for the past 2 d. Without associated flank pain, fever, chills, vaginal discharge or bleeding. Gross hematuria: not present. No specific aggravating or alleviating factors reported. No LE edema. Normal PO intake without n/v/d. Without specific abdominal pain. Ambulatory without difficulty. AZO without relief. H/O UTI: infrequent.  LMP: No LMP recorded. Patient has had a hysterectomy.  OBJECTIVE:  Vitals:   04/20/22 2012  BP: 135/85  Pulse: 91  Resp: 20  Temp: 98 F (36.7 C)  TempSrc: Oral  SpO2: 97%   General appearance: alert; no distress HENT: oropharynx: moist Lungs: unlabored respirations Abdomen: soft, non-tender Back: no CVA tenderness Extremities: no edema; symmetrical with no gross deformities Skin: warm and dry Neurologic: normal gait Psychological: alert and cooperative; normal mood and affect  Labs Reviewed  POCT URINALYSIS DIP (MANUAL ENTRY) - Abnormal; Notable for the following components:      Result Value   Clarity, UA cloudy (*)    Spec Grav, UA >=1.030 (*)    Blood, UA moderate (*)    Protein Ur, POC =30 (*)    Leukocytes, UA Trace (*)    All other components within normal limits  URINE CULTURE    Allergies  Allergen Reactions   Iodinated Contrast  Media Hives   Sulfa Antibiotics Hives    Past Medical History:  Diagnosis Date   Back pain    Cervical disc disorder with radiculopathy of cervical region    PONV (postoperative nausea and vomiting)    Sciatica    Vitamin D deficiency    Social History   Socioeconomic History   Marital status: Widowed    Spouse name: Not on file   Number of children: Not on file   Years of education: Not on file   Highest education level: Not on file  Occupational History   Occupation: Scientist, research (physical sciences) Rep  Tobacco Use   Smoking status: Never   Smokeless tobacco: Never  Vaping Use   Vaping Use: Never used  Substance and Sexual Activity   Alcohol use: Yes    Comment: occ   Drug use: No   Sexual activity: Not on file  Other Topics Concern   Not on file  Social History Narrative   Not on file   Social Determinants of Health   Financial Resource Strain: Not on file  Food Insecurity: Not on file  Transportation Needs: Not on file  Physical Activity: Not on file  Stress: Not on file  Social Connections: Not on file  Intimate Partner Violence: Not on file   Family History  Problem Relation Age of Onset   Hyperlipidemia Mother    Hypertension Mother    Sleep apnea Mother    Obesity Mother    Hypertension Father  Hyperlipidemia Father    Heart attack Father    Sudden death Father    Alcoholism Father    Hyperlipidemia Brother    Hypertension Brother    Diabetes Neg Hx         Mardella Layman, MD 04/21/22 1013

## 2022-04-22 LAB — URINE CULTURE: Culture: 60000 — AB

## 2022-04-26 ENCOUNTER — Telehealth: Payer: Self-pay | Admitting: Nurse Practitioner

## 2022-04-26 MED ORDER — FLUCONAZOLE 150 MG PO TABS: 150.0000 mg | ORAL_TABLET | Freq: Once | ORAL | 0 refills | Status: AC

## 2022-04-26 NOTE — Telephone Encounter (Signed)
Fluconazole prescription sent to pharmacy as patient reports history of yeast infection with antibiotic use

## 2022-09-16 ENCOUNTER — Ambulatory Visit (INDEPENDENT_AMBULATORY_CARE_PROVIDER_SITE_OTHER): Payer: 59 | Admitting: Podiatry

## 2022-09-16 ENCOUNTER — Encounter: Payer: Self-pay | Admitting: Podiatry

## 2022-09-16 DIAGNOSIS — M722 Plantar fascial fibromatosis: Secondary | ICD-10-CM

## 2022-09-16 NOTE — Progress Notes (Signed)
History of Present Illness Chief Complaint  Patient presents with   Plantar Fasciitis    RM13: patient is here for PF of right foot    A 62 year old patient presents with a recurrence of plantar fasciitis symptoms, which initially began in August. The discomfort is most noticeable upon rising from bed or from a seated position at her desk. The patient denies any new injuries to her feet and has not noticed any swelling. She admits to not consistently performing the recommended stretching exercises. The pain is localized to the bottom and outside of her right heel and extends up through the arch. She also reports mild discomfort in her left heel. The patient also mentions a bunion on her left foot, which may be contributing to her altered gait and subsequent flare-up of plantar fasciitis.  Physical Exam AAO x 3, NAD Vascular: DP, PT pulses 2/4 bilaterally.  Calf is supple. Dermatologic:: No ulcerations MUSCULOSKELETAL: Tenderness on palpation at central bottom and lateral aspect of right heel. Mild tenderness on palpation of left heel. No tenderness on palpation of back of right heel. Pain on palpation through the arch area of the right foot.  Assessment & Plan Plantar Fasciitis  Recurrent symptoms since August, worse in the morning and after periods of rest. Pain localized to the central and lateral aspects of the right heel and the arch. Mild pain in the left heel.   -Administer corticosteroid injections to both heels today. -Advise to ice the heels after the injections. -Continue stretching exercises, icing, and wearing shoes with good arch support. -Consider physical therapy or EPAT if symptoms persist.  Bunion on Left Foot Likely contributing to altered gait and exacerbation of plantar fasciitis. -Advise to wear wider toe box shoes and shoes with softer material to offload pressure. -Consider surgery if symptoms worsen.  Procedure: Injection Tendon/Ligament Discussed alternatives,  risks, complications and verbal consent was obtained.  Location: Bilateral plantar fascia at the glabrous junction; medial approach. Skin Prep: Alcohol. Injectate: 0.5cc 0.5% marcaine plain, 0.5 cc 2% lidocaine plain and, 1 cc kenalog 10. Disposition: Patient tolerated procedure well. Injection site dressed with a band-aid.  Post-injection care was discussed and return precautions discussed.    Follow-up as needed. If patient decides to pursue physical therapy, she can call to have a referral placed without needing an in-person visit.  [name] DPM

## 2022-09-16 NOTE — Patient Instructions (Signed)

## 2022-10-07 ENCOUNTER — Other Ambulatory Visit: Payer: Self-pay

## 2022-10-07 ENCOUNTER — Encounter: Payer: Self-pay | Admitting: Emergency Medicine

## 2022-10-07 ENCOUNTER — Ambulatory Visit
Admission: EM | Admit: 2022-10-07 | Discharge: 2022-10-07 | Disposition: A | Discharge status: Home / Self Care | Payer: 59 | Attending: Nurse Practitioner | Admitting: Nurse Practitioner

## 2022-10-07 DIAGNOSIS — Z1152 Encounter for screening for COVID-19: Secondary | ICD-10-CM | POA: Diagnosis not present

## 2022-10-07 DIAGNOSIS — J069 Acute upper respiratory infection, unspecified: Secondary | ICD-10-CM | POA: Diagnosis present

## 2022-10-07 MED ORDER — CETIRIZINE HCL 10 MG PO TABS: 10.0000 mg | ORAL_TABLET | Freq: Every day | ORAL | 0 refills | Status: AC

## 2022-10-07 MED ORDER — PSEUDOEPH-BROMPHEN-DM 30-2-10 MG/5ML PO SYRP: 5.0000 mL | ORAL_SOLUTION | Freq: Four times a day (QID) | ORAL | 0 refills | Status: AC | PRN

## 2022-10-07 MED ORDER — FLUTICASONE PROPIONATE 50 MCG/ACT NA SUSP: 2.0000 | Freq: Every day | NASAL | 0 refills | Status: AC

## 2022-10-07 NOTE — ED Triage Notes (Signed)
Both ears are itchy and stopped up.  Also c/o dry cough and nasal congestion.  Symptoms since Tuesday.  Has been taking tylenol cold and sinus to help with symptoms.

## 2022-10-07 NOTE — ED Provider Notes (Signed)
[facility]    CSN: [id number] Arrival date & time: 10/07/22  1854      History   Chief Complaint No chief complaint on file.   HPI Melissa Roth is a 62 y.o. female.   The history is provided by the patient.   Patient presents with a 2-day history of nasal congestion, dry cough, postnasal drainage, and itching in her ears.  Patient denies fever, chills, headache, sore throat, ear drainage, ear pain, wheezing, difficulty breathing, chest pain, abdominal pain, nausea, vomiting, or diarrhea.  Patient reports she has been taking over-the-counter cough and cold medicine for her symptoms with minimal relief.  Patient denies any obvious known sick contacts.  Past Medical History:  Diagnosis Date   Back pain    Cervical disc disorder with radiculopathy of cervical region    PONV (postoperative nausea and vomiting)    Sciatica    Vitamin D deficiency     Patient Active Problem List   Diagnosis Date Noted   Family history of coronary artery disease 12/19/2019   Prediabetes 11/06/2019   Class 1 obesity due to excess calories with body mass index (BMI) of 32.0 to 32.9 in adult 11/05/2019   Bunion 01/19/2019   Pain in right knee 01/02/2019   Cervical radiculopathy 02/07/2018   Chronic pain syndrome 10/03/2017   Closed fracture of second toe of left foot 08/23/2017   DDD (degenerative disc disease), cervical 03/28/2017   Lumbar post-laminectomy syndrome 03/28/2017   Status post lumbar spine surgery for decompression of spinal cord 11/10/2016   Plantar fasciitis 02/03/2016   Left shoulder pain 12/26/2012   Low back pain 09/25/2012   Cervical pain (neck) 09/25/2012    Past Surgical History:  Procedure Laterality Date   ABDOMINAL HYSTERECTOMY     CHOLECYSTECTOMY N/A 06/07/2017   Procedure: LAPAROSCOPIC CHOLECYSTECTOMY;  Surgeon: Jimmye Norman, MD;  Location: [facility] OR;  Service: General;  Laterality: N/A;   FOOT SURGERY     LUMBAR LAMINECTOMY/DECOMPRESSION  MICRODISCECTOMY Left 11/10/2016   Procedure: Left L5-S1 discectomy;  Surgeon: Venita Lick, MD;  Location: [facility] OR;  Service: Orthopedics;  Laterality: Left;  120 mins    OB History     Gravida  1   Para  1   Term      Preterm      AB      Living         SAB      IAB      Ectopic      Multiple      Live Births               Home Medications    Prior to Admission medications   Medication Sig Start Date End Date Taking? Authorizing Provider  brompheniramine-pseudoephedrine-DM 30-2-10 MG/5ML syrup Take 5 mLs by mouth 4 (four) times daily as needed. 10/07/22  Yes Yalexa Blust-Warren, Sadie Haber, NP  cetirizine (ZYRTEC) 10 MG tablet Take 1 tablet (10 mg total) by mouth daily. 10/07/22  Yes Tedi Hughson-Warren, Sadie Haber, NP  fluticasone (FLONASE) 50 MCG/ACT nasal spray Place 2 sprays into both nostrils daily. 10/07/22  Yes Breton Berns-Warren, Sadie Haber, NP  baclofen (LIORESAL) 10 MG tablet as needed. 05/04/18   [provider]  benzonatate (TESSALON) 100 MG capsule Take 100 mg by mouth as needed. 11/27/19   [provider]  buPROPion (WELLBUTRIN XL) 150 MG 24 hr tablet Take 150 mg by mouth as needed. 08/08/20   [provider]  diclofenac (VOLTAREN) 75 MG  EC tablet as needed.    [provider]  diphenhydrAMINE (BENADRYL) 50 MG tablet Take 1 tablet (50 mg total) by mouth once for 1 dose. Pt to take 50 mg of benadryl on 12/05/20 at 7 AM. Please call [phone] with any questions. Patient not taking: Reported on 12/19/2020 12/01/20 12/01/20  Oley Balm, MD  diphenhydrAMINE (BENADRYL) 50 MG tablet Take 1 tablet (50 mg total) by mouth once for 1 dose. Pt is to take 50 mg of benadryl on 04/10/21 at 7:00 AM. Please call [phone] with any questions. 03/30/21 03/30/21  Paulina Fusi, MD  estradiol (ESTRACE) 0.1 MG/GM vaginal cream as needed. 08/14/18   [provider]  gabapentin (NEURONTIN) 300 MG capsule Take 300 mg by mouth as needed. 02/25/20    [provider]  HYDROcodone-acetaminophen (NORCO/VICODIN) 5-325 MG tablet as needed.    [provider]  ibuprofen (ADVIL) 600 MG tablet Take 1 tablet (600 mg total) by mouth every 8 (eight) hours as needed for up to 21 doses for mild pain or moderate pain. 07/24/21   Terald Sleeper, MD  ipratropium (ATROVENT) 0.03 % nasal spray as needed.    [provider]  meloxicam (MOBIC) 15 MG tablet Take 1 tablet (15 mg total) by mouth daily as needed for pain. 11/03/21 11/03/22  [name], DPM  methocarbamol (ROBAXIN) 500 MG tablet as needed.    [provider]  Multiple Vitamins-Minerals (MULTIVITAMIN WITH MINERALS) tablet Take 1 tablet by mouth daily.    [provider]  oxyCODONE-acetaminophen (PERCOCET/ROXICET) 5-325 MG tablet Take 1 tablet by mouth every 6 (six) hours as needed for up to 10 doses for severe pain. 07/24/21   Terald Sleeper, MD  thiamine (VITAMIN B-1) 100 MG tablet Take 100 mg by mouth daily.    [provider]  valACYclovir (VALTREX) 500 MG tablet as needed. 11/04/18   [provider]  Vitamin D, Ergocalciferol, (DRISDOL) 1.25 MG (50000 UNIT) CAPS capsule Take 1 capsule (50,000 Units total) by mouth every 7 (seven) days. 01/10/20   Roswell Nickel, DO    Family History Family History  Problem Relation Age of Onset   Hyperlipidemia Mother    Hypertension Mother    Sleep apnea Mother    Obesity Mother    Hypertension Father    Hyperlipidemia Father    Heart attack Father    Sudden death Father    Alcoholism Father    Hyperlipidemia Brother    Hypertension Brother    Diabetes Neg Hx     Social History Social History   Tobacco Use   Smoking status: Never   Smokeless tobacco: Never  Vaping Use   Vaping status: Never Used  Substance Use Topics   Alcohol use: Yes    Comment: occ   Drug use: No     Allergies   Iodinated contrast media, Sulfa antibiotics, and Lasix [furosemide]   Review of  Systems Review of Systems Per HPI  Physical Exam Triage Vital Signs ED Triage Vitals  Encounter Vitals Group     BP 10/07/22 1900 (!) 159/65     Systolic BP Percentile --      Diastolic BP Percentile --      Pulse Rate 10/07/22 1900 82     Resp 10/07/22 1900 18     Temp 10/07/22 1900 98.2 F (36.8 C)     Temp Source 10/07/22 1900 Oral     SpO2 10/07/22 1900 96 %     Weight --  Height --      Head Circumference --      Peak Flow --      Pain Score 10/07/22 1901 0     Pain Loc --      Pain Education --      Exclude from Growth Chart --    No data found.  Updated Vital Signs BP (!) 159/65 (BP Location: Right Arm)   Pulse 82   Temp 98.2 F (36.8 C) (Oral)   Resp 18   SpO2 96%   Visual Acuity Right Eye Distance:   Left Eye Distance:   Bilateral Distance:    Right Eye Near:   Left Eye Near:    Bilateral Near:     Physical Exam Vitals and nursing note reviewed.  Constitutional:      General: She is not in acute distress.    Appearance: Normal appearance.  HENT:     Head: Normocephalic.     Right Ear: Tympanic membrane, ear canal and external ear normal.     Left Ear: Tympanic membrane, ear canal and external ear normal.     Nose: Congestion present.     Right Turbinates: Enlarged and swollen.     Left Turbinates: Enlarged and swollen.     Right Sinus: No maxillary sinus tenderness or frontal sinus tenderness.     Left Sinus: No maxillary sinus tenderness or frontal sinus tenderness.     Mouth/Throat:     Mouth: Mucous membranes are moist.     Pharynx: Oropharynx is clear. Uvula midline. Postnasal drip present.     Comments: Cobblestoning present to the posterior oropharynyx Eyes:     Extraocular Movements: Extraocular movements intact.     Conjunctiva/sclera: Conjunctivae normal.     Pupils: Pupils are equal, round, and reactive to light.  Cardiovascular:     Rate and Rhythm: Normal rate and regular rhythm.     Pulses: Normal pulses.     Heart  sounds: Normal heart sounds.  Pulmonary:     Effort: Pulmonary effort is normal.     Breath sounds: Normal breath sounds.  Abdominal:     General: Bowel sounds are normal.     Palpations: Abdomen is soft.  Musculoskeletal:     Cervical back: Normal range of motion.  Skin:    General: Skin is warm and dry.  Neurological:     General: No focal deficit present.     Mental Status: She is alert and oriented to person, place, and time.  Psychiatric:        Mood and Affect: Mood normal.        Behavior: Behavior normal.      UC Treatments / Results  Labs (all labs ordered are listed, but only abnormal results are displayed) Labs Reviewed  SARS CORONAVIRUS 2 (TAT 6-24 HRS)    EKG   Radiology No results found.  Procedures Procedures (including critical care time)  Medications Ordered in UC Medications - No data to display  Initial Impression / Assessment and Plan / UC Course  I have reviewed the triage vital signs and the nursing notes.  Pertinent labs & imaging results that were available during my care of the patient were reviewed by me and considered in my medical decision making (see chart for details).  The patient is well-appearing, she is in no acute distress, vital signs are stable.  COVID test is pending.  Patient is able to receive Paxlovid if her COVID test is positive.  Do  suspect a viral upper respiratory infection with cough.  Will provide symptomatic treatment while COVID test is pending with Bromfed-DM for the cough and nasal congestion, fluticasone 50 micro nasal spray for nasal congestion and postnasal drainage, and cetirizine 10 mg for nasal congestion and postnasal drainage.  Supportive care recommendations were provided and discussed with the patient to include increasing fluids, allowing for plenty of rest, over-the-counter analgesics, normal saline nasal spray, and warm salt water gargles.  Discussed viral etiology with the patient and when follow-up may  be indicated.  Also discussed with patient that if her test is positive for COVID, she should remain home until his been fever free for 24 hours with no medication.  Patient is in agreement with this plan of care and verbalizes understanding.  All questions were answered.  Patient stable for discharge.    Final Clinical Impressions(s) / UC Diagnoses   Final diagnoses:  Viral upper respiratory tract infection with cough  Encounter for screening for COVID-19     Discharge Instructions      COVID test is pending.  You will be contacted if the pending test result is positive. Take medication as prescribed.   Increase fluids and allow for plenty of rest. May take over-the-counter Tylenol or ibuprofen as needed for pain, fever, or general discomfort. Recommend normal saline nasal spray throughout the day to help with nasal congestion and runny nose. For your cough, recommend using a humidifier in your bedroom at nighttime during sleep and sleeping elevated on pillows while cough symptoms persist. If your COVID test is positive and you have a fever, you will need to remain home if you develop a fever.  You can return to your normal activities when you have been fever free for 24 hours with no medication.  If you have symptoms, you can return to your normal activities as long as you are wearing a mask.  If you require the medication, and continue to experience symptoms after completing the medication, continue to wear your mask for an additional 5 days. If symptoms do not improve over the next 7 to 10 days, or suddenly worsen before that time, you may follow-up in this clinic or with your primary care physician for further evaluation. Follow-up as needed.       ED Prescriptions     Medication Sig Dispense Auth. Provider   brompheniramine-pseudoephedrine-DM 30-2-10 MG/5ML syrup Take 5 mLs by mouth 4 (four) times daily as needed. 140 mL Eris Hannan-Warren, Sadie Haber, NP   fluticasone (FLONASE) 50  MCG/ACT nasal spray Place 2 sprays into both nostrils daily. 16 g Adalyn Pennock-Warren, Sadie Haber, NP   cetirizine (ZYRTEC) 10 MG tablet Take 1 tablet (10 mg total) by mouth daily. 30 tablet Morenike Cuff-Warren, Sadie Haber, NP      PDMP not reviewed this encounter.   Abran Cantor, NP 10/07/22 1947

## 2022-10-07 NOTE — Discharge Instructions (Addendum)
COVID test is pending.  You will be contacted if the pending test result is positive. Take medication as prescribed.   Increase fluids and allow for plenty of rest. May take over-the-counter Tylenol or ibuprofen as needed for pain, fever, or general discomfort. Recommend normal saline nasal spray throughout the day to help with nasal congestion and runny nose. For your cough, recommend using a humidifier in your bedroom at nighttime during sleep and sleeping elevated on pillows while cough symptoms persist. If your COVID test is positive and you have a fever, you will need to remain home if you develop a fever.  You can return to your normal activities when you have been fever free for 24 hours with no medication.  If you have symptoms, you can return to your normal activities as long as you are wearing a mask.  If you require the medication, and continue to experience symptoms after completing the medication, continue to wear your mask for an additional 5 days. If symptoms do not improve over the next 7 to 10 days, or suddenly worsen before that time, you may follow-up in this clinic or with your primary care physician for further evaluation. Follow-up as needed.

## 2022-10-08 ENCOUNTER — Telehealth: Payer: Self-pay | Admitting: Emergency Medicine

## 2022-10-08 LAB — SARS CORONAVIRUS 2 (TAT 6-24 HRS): SARS Coronavirus 2: POSITIVE — AB

## 2022-10-08 MED ORDER — PAXLOVID (150/100) 10 X 150 MG & 10 X 100MG PO TBPK: 2.0000 | ORAL_TABLET | Freq: Two times a day (BID) | ORAL | 0 refills | Status: DC

## 2022-10-08 MED ORDER — PAXLOVID (300/100) 20 X 150 MG & 10 X 100MG PO TBPK: 3.0000 | ORAL_TABLET | Freq: Two times a day (BID) | ORAL | 0 refills | Status: AC

## 2022-10-08 NOTE — Telephone Encounter (Signed)
Patient is covid positive.  Paxlovid sent into [facility] for patient

## 2022-12-14 NOTE — Progress Notes (Signed)
Cardiology Office Note    Patient Name: Melissa Roth Date of Encounter: 12/17/2022  Primary Care Provider:  Jarrett Soho, PA-C Primary Cardiologist:  Christell Constant, MD Primary Electrophysiologist: None   Past Medical History    Past Medical History:  Diagnosis Date   Back pain    Cervical disc disorder with radiculopathy of cervical region    PONV (postoperative nausea and vomiting)    Sciatica    Vitamin D deficiency     History of Present Illness  Melissa Roth is a 62 y.o. female with PMH of strong family history of CAD who presents for 1 year follow-up.   [name] was last seen on 12/2021 for annual follow-up visit.  She reported at that time doing well with no new cardiac complaints.  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  Blood pressure was well-controlled at that time was completing water therapy to help with degenerative disc disease and leg injury.  There were no interval medication changes made at visit.  Ms. Roti presents today for 1 year follow-up. T They report increased stress and decreased free time due to caring for their mother and autistic brother who recently had back surgery. Despite these challenges, they continue to walk approximately two miles when possible and maintain a healthy diet, avoiding fried foods and eating plenty of salads and baked foods. They express frustration with recent weight gain, although it is only about five to six pounds over the past year. They are aware of the need for stricter dietary control and express a desire to lose weight. During today's visit the patient reports no new cardiac complaints or concerns.  Patient denies chest pain, palpitations, dyspnea, PND, orthopnea, nausea, vomiting, dizziness, syncope, edema, weight gain, or early satiety.   Review of Systems  Please see the history of present illness.    All other systems reviewed and are otherwise negative except as  noted above.  Physical Exam    Wt Readings from Last 3 Encounters:  12/17/22 195 lb (88.5 kg)  12/22/21 186 lb 6.4 oz (84.6 kg)  07/24/21 189 lb (85.7 kg)   VS: Vitals:   12/17/22 0910  BP: 130/78  Pulse: 77  SpO2: 97%  ,Body mass index is 33.47 kg/m. GEN: Well nourished, well developed in no acute distress Neck: No JVD; No carotid bruits Pulmonary: Clear to auscultation without rales, wheezing or rhonchi  Cardiovascular: Normal rate. Regular rhythm. Normal S1. Normal S2.   Murmurs: There is no murmur.  ABDOMEN: Soft, non-tender, non-distended EXTREMITIES:  No edema; No deformity   EKG/LABS/ Recent Cardiac Studies   ECG personally reviewed by me today -sinus rhythm with rate of 77 bpm and no acute changes consistent with previous EKG.  Risk Assessment/Calculations:          Lab Results  Component Value Date   WBC 5.3 06/06/2017   HGB 12.6 06/06/2017   HCT 39.6 06/06/2017   MCV 95.7 06/06/2017   PLT 285 06/06/2017   Lab Results  Component Value Date   CREATININE 0.69 11/05/2019   BUN 10 11/05/2019   NA 139 11/05/2019   K 4.2 11/05/2019   CL 103 11/05/2019   CO2 27 11/05/2019   Lab Results  Component Value Date   CHOL 155 11/05/2019   HDL 84 11/05/2019   LDLCALC 57 11/05/2019   TRIG 71 11/05/2019    Lab Results  Component Value Date   HGBA1C 5.7 (H) 11/05/2019  Assessment & Plan    1.Family history of CAD: -Continue your ASCVD score was 2.4% -Calcium score was completed with score of 0 -She reports no new cardiac complaints or concerns. --Check Lipoprotein A to further assess cardiovascular risk.  2.Degenerative disc disease /cervical radiculopathy:  -She reports ongoing management with no acute changes since previous follow-up.  3.Weight Management Patient expressed concern about recent weight gain, though it is within a reasonable range from last year. She is aware of her dietary habits and has control over her sweet intake. -Encouraged  continuation of healthy diet and regular exercise. -Suggested following a Mediterranean or DASH diet plan.  Disposition: Follow-up with Christell Constant, MD or APP in 12 months    Signed, Napoleon Form, Leodis Rains, NP 12/17/2022, 9:49 AM [facility]

## 2022-12-17 ENCOUNTER — Encounter: Payer: Self-pay | Admitting: Nurse Practitioner

## 2022-12-17 ENCOUNTER — Ambulatory Visit (INDEPENDENT_AMBULATORY_CARE_PROVIDER_SITE_OTHER): Payer: 59 | Admitting: Podiatry

## 2022-12-17 ENCOUNTER — Encounter: Payer: Self-pay | Admitting: Podiatry

## 2022-12-17 ENCOUNTER — Ambulatory Visit: Payer: 59 | Attending: Nurse Practitioner | Admitting: Nurse Practitioner

## 2022-12-17 VITALS — BP 130/78 | HR 77 | Ht 64.0 in | Wt 195.0 lb

## 2022-12-17 DIAGNOSIS — M5030 Other cervical disc degeneration, unspecified cervical region: Secondary | ICD-10-CM | POA: Diagnosis not present

## 2022-12-17 DIAGNOSIS — Z8249 Family history of ischemic heart disease and other diseases of the circulatory system: Secondary | ICD-10-CM

## 2022-12-17 DIAGNOSIS — M5412 Radiculopathy, cervical region: Secondary | ICD-10-CM | POA: Diagnosis not present

## 2022-12-17 DIAGNOSIS — Z7689 Persons encountering health services in other specified circumstances: Secondary | ICD-10-CM | POA: Diagnosis not present

## 2022-12-17 DIAGNOSIS — M722 Plantar fascial fibromatosis: Secondary | ICD-10-CM

## 2022-12-17 MED ORDER — MELOXICAM 15 MG PO TABS: 15.0000 mg | ORAL_TABLET | Freq: Every day | ORAL | 0 refills | Status: AC | PRN

## 2022-12-17 MED ORDER — TRIAMCINOLONE ACETONIDE 10 MG/ML IJ SUSP
5.0000 mg | Freq: Once | INTRAMUSCULAR | Status: AC
  Administered 2022-12-17: 5 mg via INTRAMUSCULAR

## 2022-12-17 NOTE — Patient Instructions (Addendum)

## 2022-12-17 NOTE — Patient Instructions (Signed)
 Medication Instructions:  No Changes *If you need a refill on your cardiac medications before your next appointment, please call your pharmacy*   Lab Work: No Labs If you have labs (blood work) drawn today and your tests are completely normal, you will receive your results only by: MyChart Message (if you have MyChart) OR A paper copy in the mail If you have any lab test that is abnormal or we need to change your treatment, we will call you to review the results.   Testing/Procedures: No Testing   Follow-Up: At [facility], you and your health needs are our priority.  As part of our continuing mission to provide you with exceptional heart care, we have created designated Provider Care Teams.  These Care Teams include your primary Cardiologist (physician) and Advanced Practice Providers (APPs -  Physician Assistants and Nurse Practitioners) who all work together to provide you with the care you need, when you need it.  We recommend signing up for the patient portal called "MyChart".  Sign up information is provided on this After Visit Summary.  MyChart is used to connect with patients for Virtual Visits (Telemedicine).  Patients are able to view lab/test results, encounter notes, upcoming appointments, etc.  Non-urgent messages can be sent to your provider as well.   To learn more about what you can do with MyChart, go to [email or web address].    Your next appointment:   1 year(s)  Provider:   Christell Constant, MD

## 2022-12-17 NOTE — Progress Notes (Unsigned)
History of Present Illness Chief Complaint  Patient presents with   Plantar Fasciitis    RM#13 Patient states is here for injections on both feet pain on the right foot is worse then the left.     A 62 year old patient presents with a recurrence of plantar fasciitis symptoms.  She states that she was doing well but the pain started to come back and she is requesting injections again today.  She does not report any recent injury or changes.  She still stretching, icing.  She tried to wear supportive shoe gear.  She has no other concerns today.    Physical Exam AAO x 3, NAD Vascular: DP, PT pulses 2/4 bilaterally.  Calf is supple. Dermatologic:: No ulcerations MUSCULOSKELETAL: Tenderness on palpation at plantar medial aspect of the heel bilaterally.  There is no edema, erythema.  Negative sign.  No pain with lateral compression of calcaneus.  Flexor, extensor tendons.  Intact.  MMT 5/5.  Assessment & Plan Plantar Fasciitis, bilaterally  Recurrent symptoms since August, worse in the morning and after periods of rest. Pain localized to the central and lateral aspects of the right heel and the arch. Mild pain in the left heel.   -Administer corticosteroid injections to both heels today.  See procedure note below. -Advise to ice the heels after the injections. -Continue stretching exercises, icing, and wearing shoes with good arch support. -We again discussed alternative treatments including physical therapy or EPAT if symptoms persist.  As last resort there is surgical intervention if needed.  Procedure: Injection Tendon/Ligament Discussed alternatives, risks, complications and verbal consent was obtained.  Location: Bilateral plantar fascia at the glabrous junction; medial approach. Skin Prep: Alcohol. Injectate: 0.5cc 0.5% marcaine plain, 0.5 cc 2% lidocaine plain and, 1 cc kenalog 10. Disposition: Patient tolerated procedure well. Injection site dressed with a band-aid.  Post-injection  care was discussed and return precautions discussed.   [name] DPM

## 2023-01-04 ENCOUNTER — Telehealth: Payer: Self-pay | Admitting: Internal Medicine

## 2023-01-04 LAB — LIPOPROTEIN A (LPA): Lipoprotein (a): 41.7 nmol/L (ref <75.0)

## 2023-01-04 NOTE — Telephone Encounter (Signed)
See lab results notes ./cy 

## 2023-01-04 NOTE — Telephone Encounter (Signed)
Patient returned RN's call regarding results. 

## 2023-01-04 NOTE — Telephone Encounter (Signed)
Lm to call back ./cy 

## 2023-01-04 NOTE — Telephone Encounter (Signed)
Patient is returning call. Transferred to [place], LPN.

## 2023-02-07 ENCOUNTER — Other Ambulatory Visit: Payer: Self-pay | Admitting: Obstetrics and Gynecology

## 2023-02-07 DIAGNOSIS — Z1231 Encounter for screening mammogram for malignant neoplasm of breast: Secondary | ICD-10-CM

## 2023-02-18 ENCOUNTER — Ambulatory Visit
Admission: RE | Admit: 2023-02-18 | Discharge: 2023-02-18 | Disposition: A | Discharge status: Home / Self Care | Payer: 59 | Source: Ambulatory Visit | Attending: Obstetrics and Gynecology | Admitting: Obstetrics and Gynecology

## 2023-02-18 DIAGNOSIS — Z1231 Encounter for screening mammogram for malignant neoplasm of breast: Secondary | ICD-10-CM

## 2023-05-04 ENCOUNTER — Encounter (INDEPENDENT_AMBULATORY_CARE_PROVIDER_SITE_OTHER): Payer: Self-pay | Admitting: Otolaryngology

## 2023-05-12 ENCOUNTER — Encounter (INDEPENDENT_AMBULATORY_CARE_PROVIDER_SITE_OTHER): Payer: Self-pay | Admitting: Otolaryngology

## 2023-07-04 ENCOUNTER — Encounter (INDEPENDENT_AMBULATORY_CARE_PROVIDER_SITE_OTHER): Payer: Self-pay | Admitting: Physician Assistant

## 2023-07-04 ENCOUNTER — Ambulatory Visit (INDEPENDENT_AMBULATORY_CARE_PROVIDER_SITE_OTHER): Admitting: Audiology

## 2023-07-04 ENCOUNTER — Ambulatory Visit (INDEPENDENT_AMBULATORY_CARE_PROVIDER_SITE_OTHER): Admitting: Physician Assistant

## 2023-07-04 ENCOUNTER — Encounter: Payer: Self-pay | Admitting: Plastic Surgery

## 2023-07-04 ENCOUNTER — Ambulatory Visit (INDEPENDENT_AMBULATORY_CARE_PROVIDER_SITE_OTHER): Admitting: Plastic Surgery

## 2023-07-04 VITALS — BP 141/84 | HR 84 | Ht 64.0 in | Wt 196.2 lb

## 2023-07-04 VITALS — BP 132/70 | HR 69

## 2023-07-04 DIAGNOSIS — M5030 Other cervical disc degeneration, unspecified cervical region: Secondary | ICD-10-CM

## 2023-07-04 DIAGNOSIS — M545 Low back pain, unspecified: Secondary | ICD-10-CM

## 2023-07-04 DIAGNOSIS — H6990 Unspecified Eustachian tube disorder, unspecified ear: Secondary | ICD-10-CM

## 2023-07-04 DIAGNOSIS — Z9889 Other specified postprocedural states: Secondary | ICD-10-CM

## 2023-07-04 DIAGNOSIS — Z6833 Body mass index (BMI) 33.0-33.9, adult: Secondary | ICD-10-CM

## 2023-07-04 DIAGNOSIS — N62 Hypertrophy of breast: Secondary | ICD-10-CM | POA: Diagnosis not present

## 2023-07-04 DIAGNOSIS — M542 Cervicalgia: Secondary | ICD-10-CM

## 2023-07-04 DIAGNOSIS — H6993 Unspecified Eustachian tube disorder, bilateral: Secondary | ICD-10-CM

## 2023-07-04 DIAGNOSIS — Z803 Family history of malignant neoplasm of breast: Secondary | ICD-10-CM

## 2023-07-04 DIAGNOSIS — M961 Postlaminectomy syndrome, not elsewhere classified: Secondary | ICD-10-CM

## 2023-07-04 DIAGNOSIS — M5412 Radiculopathy, cervical region: Secondary | ICD-10-CM

## 2023-07-04 DIAGNOSIS — G8929 Other chronic pain: Secondary | ICD-10-CM

## 2023-07-04 DIAGNOSIS — Z0110 Encounter for examination of ears and hearing without abnormal findings: Secondary | ICD-10-CM

## 2023-07-04 NOTE — Progress Notes (Signed)
 Patient ID: Melissa Roth, female    DOB: 1960/09/11, 63 y.o.   MRN: [id number]   Chief Complaint  Patient presents with   consult    Mammary Hyperplasia: The patient is a 62 y.o. female with a history of mammary hyperplasia for several years.  She has extremely large breasts causing symptoms that include the following: Back pain in the upper and lower back, including neck pain. She pulls or pins her bra straps to provide better lift and relief of the pressure and pain. She notices relief by holding her breast up manually.  Her shoulder straps cause grooves and pain and pressure that requires padding for relief. Pain medication is sometimes required with motrin  and tylenol .  Activities that are hindered by enlarged breasts include: exercise and running.  She has tried supportive clothing as well as fitted bras without improvement.  Her breasts are extremely large and fairly symmetric.  She has hyperpigmentation of the inframammary area on both sides.  The sternal to nipple distance on the right is 31 cm and the left is 31 cm.  The IMF distance is 13 cm.  She is 5 feet 4 inches tall and weighs 196 pounds.  The BMI = 33.6 kg/m.  Preoperative bra size = D cup.  She would like to be a pea size cup.  The estimated excess breast tissue to be removed at the time of surgery = 550-600 grams on the left and 550-600 grams on the right.  Mammogram history: February 2025 and it was negative.  Family history of breast cancer: Mom and niece on her mother side.  Tobacco use: None.   No history of diabetes the patient expresses the desire to pursue surgical intervention.  The patient has had physical therapy for the past several months without much improvement.  She also has seen spine surgery who referred her to us .     Review of Systems  Constitutional:  Positive for activity change.  Eyes: Negative.   Respiratory: Negative.    Cardiovascular: Negative.   Gastrointestinal: Negative.   Endocrine:  Negative.   Genitourinary: Negative.   Musculoskeletal:  Positive for back pain and neck pain.    Past Medical History:  Diagnosis Date   Back pain    Cervical disc disorder with radiculopathy of cervical region    PONV (postoperative nausea and vomiting)    Sciatica    Vitamin D  deficiency     Past Surgical History:  Procedure Laterality Date   ABDOMINAL HYSTERECTOMY     CHOLECYSTECTOMY N/A 06/07/2017   Procedure: LAPAROSCOPIC CHOLECYSTECTOMY;  Surgeon: Kimble Agent, MD;  Location: [facility] OR;  Service: General;  Laterality: N/A;   FOOT SURGERY     LUMBAR LAMINECTOMY/DECOMPRESSION MICRODISCECTOMY Left 11/10/2016   Procedure: Left L5-S1 discectomy;  Surgeon: Burnetta Aures, MD;  Location: [facility] OR;  Service: Orthopedics;  Laterality: Left;  120 mins      Current Outpatient Medications:    baclofen  (LIORESAL ) 10 MG tablet, as needed., Disp: , Rfl:    brompheniramine-pseudoephedrine-DM 30-2-10 MG/5ML syrup, Take 5 mLs by mouth 4 (four) times daily as needed., Disp: 140 mL, Rfl: 0   buPROPion (WELLBUTRIN XL) 150 MG 24 hr tablet, Take 150 mg by mouth as needed., Disp: , Rfl:    estradiol (ESTRACE) 0.1 MG/GM vaginal cream, as needed., Disp: , Rfl:    gabapentin  (NEURONTIN ) 300 MG capsule, Take 300 mg by mouth as needed., Disp: , Rfl:    HYDROcodone -acetaminophen  (NORCO/VICODIN) 5-325 MG tablet,  as needed., Disp: , Rfl:    ipratropium (ATROVENT) 0.03 % nasal spray, as needed., Disp: , Rfl:    meloxicam  (MOBIC ) 15 MG tablet, Take 1 tablet (15 mg total) by mouth daily as needed for pain., Disp: 30 tablet, Rfl: 0   methocarbamol  (ROBAXIN ) 500 MG tablet, as needed., Disp: , Rfl:    Multiple Vitamins-Minerals (MULTIVITAMIN WITH MINERALS) tablet, Take 1 tablet by mouth daily., Disp: , Rfl:    oxyCODONE -acetaminophen  (PERCOCET/ROXICET) 5-325 MG tablet, Take 1 tablet by mouth every 6 (six) hours as needed for up to 10 doses for severe pain., Disp: 10 tablet, Rfl: 0   valACYclovir (VALTREX) 500 MG tablet,  as needed., Disp: , Rfl:    Vitamin D , Ergocalciferol , (DRISDOL ) 1.25 MG (50000 UNIT) CAPS capsule, Take 1 capsule (50,000 Units total) by mouth every 7 (seven) days., Disp: 5 capsule, Rfl: 0   benzonatate (TESSALON) 100 MG capsule, Take 100 mg by mouth as needed., Disp: , Rfl:    cetirizine  (ZYRTEC ) 10 MG tablet, Take 1 tablet (10 mg total) by mouth daily., Disp: 30 tablet, Rfl: 0   diphenhydrAMINE  (BENADRYL ) 50 MG tablet, Take 1 tablet (50 mg total) by mouth once for 1 dose. Pt to take 50 mg of benadryl  on 12/05/20 at 7 AM. Please call [phone] with any questions. (Patient not taking: Reported on 07/04/2023), Disp: 1 tablet, Rfl: 0   fluticasone  (FLONASE ) 50 MCG/ACT nasal spray, Place 2 sprays into both nostrils daily. (Patient not taking: Reported on 07/04/2023), Disp: 16 g, Rfl: 0   thiamine (VITAMIN B-1) 100 MG tablet, Take 100 mg by mouth daily., Disp: , Rfl:    Objective:   Vitals:   07/04/23 1239  BP: (!) 141/84  Pulse: 84  SpO2: 95%    Physical Exam Vitals reviewed.  Constitutional:      Appearance: Normal appearance.  HENT:     Head: Atraumatic.   Cardiovascular:     Rate and Rhythm: Normal rate.     Pulses: Normal pulses.  Pulmonary:     Effort: Pulmonary effort is normal.  Abdominal:     Palpations: Abdomen is soft.   Musculoskeletal:        General: Normal range of motion.   Skin:    General: Skin is warm.     Capillary Refill: Capillary refill takes less than 2 seconds.   Neurological:     Mental Status: She is alert and oriented to person, place, and time.   Psychiatric:        Mood and Affect: Mood normal.        Behavior: Behavior normal.        Thought Content: Thought content normal.        Judgment: Judgment normal.    Assessment & Plan:  Lumbar post-laminectomy syndrome  Status post lumbar spine surgery for decompression of spinal cord  Chronic bilateral low back pain, unspecified whether sciatica present  DDD (degenerative disc disease),  cervical  Cervical radiculopathy  Cervical pain (neck)  Symptomatic mammary hypertrophy  The procedure the patient selected and that was best for the patient was discussed. The risk were discussed and include but not limited to the following:  Breast asymmetry, fluid accumulation, firmness of the breast, inability to breast feed, loss of nipple or areola, skin loss, change in skin and nipple sensation, fat necrosis of the breast tissue, bleeding, infection and healing delay.  There are risks of anesthesia and injury to nerves or blood vessels.  Allergic reaction to  tape, suture and skin glue are possible.  There will be swelling.  Any of these can lead to the need for revisional surgery which is not included in this surgery.  A breast reduction has potential to interfere with diagnostic procedures in the future.  This procedure is best done when the breast is fully developed.  Changes in the breast will continue to occur over time: pregnancy, weight gain or weigh loss. No guarantees are given for a certain bra or breast size.    Total time: 40 minutes. This includes time spent with the patient during the visit as well as time spent before and after the visit reviewing the chart, documenting the encounter, ordering pertinent studies and literature for the patient.   Physical therapy: Done Mammogram: Negative  The patient is a good candidate for bilateral breast reduction with liposuction.  She is going to continue to try to decrease her weight as well.  Pictures were obtained of the patient and placed in the chart with the patient's or guardian's permission.   Melissa RAMAN Tahesha Skeet, DO

## 2023-07-04 NOTE — Progress Notes (Signed)
  [address], [address] [place], [place] [address] [phone]  Audiological Evaluation    Name: Melissa Roth     DOB:   1960-09-10      MRN:   [id number]                                                                                     Service Date: 07/04/2023     Accompanied by: unaccompanied   Patient comes today after Reyes Cohen, PA-C sent a referral for a hearing evaluation due to concerns with ear pain.   Symptoms Yes Details  Hearing loss  []    Tinnitus  [x]  Very rare  Ear pain/ infections/pressure  [x]  Pain in both ears- comes and goes and wonders if it is related to allergies  Balance problems  []    Noise exposure history  []    Previous ear surgeries  []    Family history of hearing loss  [x]  Mother with age  Amplification  []    Other  []      Otoscopy: Right ear: Clear external ear canal and notable landmarks visualized on the tympanic membrane. Left ear:  Clear external ear canal and notable landmarks visualized on the tympanic membrane.  Tympanometry: Right ear: Type A- Normal external ear canal volume with normal middle ear pressure and tympanic membrane compliance. Left ear: Type A- Normal external ear canal volume with normal middle ear pressure and tympanic membrane compliance.    Pure tone Audiometry: Both ears - Normal hearing from 250 Hz - 8000 Hz.    Speech Audiometry: Right ear- Speech Reception Threshold (SRT) was obtained at 10 dBHL. Left ear-Speech Reception Threshold (SRT) was obtained at 10 dBHL.   Word Recognition Score Tested using NU-6 (recorded) Right ear: 100% was obtained at a presentation level of 50 dBHL with contralateral masking which is deemed as  excellent. Left ear: 100% was obtained at a presentation level of 50 dBHL with contralateral masking which is deemed as  excellent.   The hearing test results were completed under headphones and results are deemed to be of good reliability. Test technique:  conventional      Recommendations: Follow up with ENT as scheduled for today. Return for a hearing evaluation if concerns with hearing changes arise or per MD recommendation.   Yoseph Haile MARIE LEROUX-MARTINEZ, AUD

## 2023-07-04 NOTE — Progress Notes (Signed)
 Dear Dr. Katina, Here is my assessment for our mutual patient, Melissa Roth. Thank you for allowing me the opportunity to care for your patient. Please do not hesitate to contact me should you have any other questions. Sincerely, Chyrl Cohen PA-C  Otolaryngology Clinic Note Referring provider: Dr. Katina HPI:  Melissa Roth is a 63 y.o. female kindly referred by Dr. Katina   The patient is a 63 year old female seen in our office for evaluation of ear pressure.  The patient notes over the last 2 years she has had pressure in her ears with associated clicking and popping.  She notes that when she goes on airplanes the pressure becomes more.  She denies any significant pain, no changes to her hearing.  She notes that when she opens her mouth she is able to make her ears pop.  She denies any episodes of ear infections as an adult, she notes some ear infections as a kid but not reoccurring.  She denies any head or neck surgery, no trauma.  She does report a history of seasonal allergies which are significantly worse this year.  She currently takes Zyrtec  and Flonase  to get some relief of her symptoms but notes over the last week things have been more bothersome.  She notes occasional ringing in her ears, no persistent ringing.    Independent Review of Additional Tests or Records:  Audiological evaluation 07/04/2023  Normal audiological evaluation     PMH/Meds/All/SocHx/FamHx/ROS:   Past Medical History:  Diagnosis Date   Back pain    Cervical disc disorder with radiculopathy of cervical region    PONV (postoperative nausea and vomiting)    Sciatica    Vitamin D  deficiency      Past Surgical History:  Procedure Laterality Date   ABDOMINAL HYSTERECTOMY     CHOLECYSTECTOMY N/A 06/07/2017   Procedure: LAPAROSCOPIC CHOLECYSTECTOMY;  Surgeon: Kimble Agent, MD;  Location: [facility] OR;  Service: General;  Laterality: N/A;   FOOT SURGERY     LUMBAR LAMINECTOMY/DECOMPRESSION MICRODISCECTOMY Left  11/10/2016   Procedure: Left L5-S1 discectomy;  Surgeon: Burnetta Aures, MD;  Location: [facility] OR;  Service: Orthopedics;  Laterality: Left;  120 mins    Family History  Problem Relation Age of Onset   Hyperlipidemia Mother    Hypertension Mother    Sleep apnea Mother    Obesity Mother    Hypertension Father    Hyperlipidemia Father    Heart attack Father    Sudden death Father    Alcoholism Father    Hyperlipidemia Brother    Hypertension Brother    Diabetes Neg Hx      Social Connections: Not on file      Current Outpatient Medications:    baclofen  (LIORESAL ) 10 MG tablet, as needed., Disp: , Rfl:    benzonatate (TESSALON) 100 MG capsule, Take 100 mg by mouth as needed., Disp: , Rfl:    brompheniramine-pseudoephedrine-DM 30-2-10 MG/5ML syrup, Take 5 mLs by mouth 4 (four) times daily as needed., Disp: 140 mL, Rfl: 0   buPROPion (WELLBUTRIN XL) 150 MG 24 hr tablet, Take 150 mg by mouth as needed., Disp: , Rfl:    cetirizine  (ZYRTEC ) 10 MG tablet, Take 1 tablet (10 mg total) by mouth daily., Disp: 30 tablet, Rfl: 0   diphenhydrAMINE  (BENADRYL ) 50 MG tablet, Take 1 tablet (50 mg total) by mouth once for 1 dose. Pt to take 50 mg of benadryl  on 12/05/20 at 7 AM. Please call [phone] with any questions. (Patient taking differently: Take  50 mg by mouth as needed for allergies. Pt to take 50 mg of benadryl  on 12/05/20 at 7 AM. Please call [phone] with any questions.), Disp: 1 tablet, Rfl: 0   estradiol (ESTRACE) 0.1 MG/GM vaginal cream, as needed., Disp: , Rfl:    fluticasone  (FLONASE ) 50 MCG/ACT nasal spray, Place 2 sprays into both nostrils daily., Disp: 16 g, Rfl: 0   gabapentin  (NEURONTIN ) 300 MG capsule, Take 300 mg by mouth as needed., Disp: , Rfl:    HYDROcodone -acetaminophen  (NORCO/VICODIN) 5-325 MG tablet, as needed., Disp: , Rfl:    ipratropium (ATROVENT) 0.03 % nasal spray, as needed., Disp: , Rfl:    meloxicam  (MOBIC ) 15 MG tablet, Take 1 tablet (15 mg total) by mouth daily  as needed for pain., Disp: 30 tablet, Rfl: 0   methocarbamol  (ROBAXIN ) 500 MG tablet, as needed., Disp: , Rfl:    Multiple Vitamins-Minerals (MULTIVITAMIN WITH MINERALS) tablet, Take 1 tablet by mouth daily., Disp: , Rfl:    oxyCODONE -acetaminophen  (PERCOCET/ROXICET) 5-325 MG tablet, Take 1 tablet by mouth every 6 (six) hours as needed for up to 10 doses for severe pain., Disp: 10 tablet, Rfl: 0   thiamine (VITAMIN B-1) 100 MG tablet, Take 100 mg by mouth daily., Disp: , Rfl:    valACYclovir (VALTREX) 500 MG tablet, as needed., Disp: , Rfl:    Vitamin D , Ergocalciferol , (DRISDOL ) 1.25 MG (50000 UNIT) CAPS capsule, Take 1 capsule (50,000 Units total) by mouth every 7 (seven) days., Disp: 5 capsule, Rfl: 0   Physical Exam:   There were no vitals taken for this visit.  Pertinent Findings  CN II-XII intact Bilateral EAC clear and TM intact with well pneumatized middle ear spaces Anterior rhinoscopy: Septum midline; bilateral inferior turbinates with minimal hypertrophy No lesions of oral cavity/oropharynx; dentition WNL No obviously palpable neck masses/lymphadenopathy/thyromegaly No respiratory distress or stridor  Seprately Identifiable Procedures:  none  Impression & Plans:  Melissa Roth is a 63 y.o. female with the following   Eustachian tube dysfunction-  63 year old female seen today with evaluation of ear pressure.  Her symptoms are most consistent with eustachian tube dysfunction.  Physical exam benign today, audiological evaluation within normal limits.  I would recommend she maximize medical therapy and treatment of her allergies, if her symptoms continue to persist would consider adding additional allergy medicine and referring to allergist.  The patient will add nasal saline irrigations and follow-up on a as needed basis.  She verbalized understanding and agreement to today's plan.   - f/u PRN   Thank you for allowing me the opportunity to care for your patient. Please do  not hesitate to contact me should you have any other questions.  Sincerely, Chyrl Cohen PA-C [facility] Phone: [phone] Fax: [phone]  07/04/2023, 9:46 AM

## 2023-07-12 ENCOUNTER — Encounter: Payer: Self-pay | Admitting: Audiology

## 2023-08-05 ENCOUNTER — Ambulatory Visit
Admission: EM | Admit: 2023-08-05 | Discharge: 2023-08-05 | Disposition: A | Discharge status: Home / Self Care | Attending: Nurse Practitioner | Admitting: Nurse Practitioner

## 2023-08-05 ENCOUNTER — Other Ambulatory Visit: Payer: Self-pay

## 2023-08-05 ENCOUNTER — Encounter: Payer: Self-pay | Admitting: Emergency Medicine

## 2023-08-05 DIAGNOSIS — J069 Acute upper respiratory infection, unspecified: Secondary | ICD-10-CM

## 2023-08-05 LAB — POC SOFIA SARS ANTIGEN FIA: SARS Coronavirus 2 Ag: NEGATIVE

## 2023-08-05 MED ORDER — PROMETHAZINE-DM 6.25-15 MG/5ML PO SYRP: 5.0000 mL | ORAL_SOLUTION | Freq: Every evening | ORAL | 0 refills | Status: AC | PRN

## 2023-08-05 NOTE — ED Provider Notes (Signed)
 [facility]    CSN: [id number] Arrival date & time: 08/05/23  1411      History   Chief Complaint Chief Complaint  Patient presents with   Cough    HPI Melissa Roth is a 63 y.o. female.   The history is provided by the patient.   Patient presents with a 3-4-day history of nasal congestion, dry cough, postnasal drainage, and headache. Patient denies fever, chills, sore throat, ear drainage, ear pain, wheezing, difficulty breathing, chest pain, abdominal pain, nausea, vomiting, or diarrhea. Patient reports she has been taking Bromfed, cetirizine , and Flonase  for her symptoms.  Patient states I just cannot get anything up.  Patient denies any obvious known sick contacts.   Past Medical History:  Diagnosis Date   Back pain    Cervical disc disorder with radiculopathy of cervical region    PONV (postoperative nausea and vomiting)    Sciatica    Vitamin D  deficiency     Patient Active Problem List   Diagnosis Date Noted   Symptomatic mammary hypertrophy 07/04/2023   Family history of coronary artery disease 12/19/2019   Prediabetes 11/06/2019   Class 1 obesity due to excess calories with body mass index (BMI) of 32.0 to 32.9 in adult 11/05/2019   Bunion 01/19/2019   Pain in right knee 01/02/2019   Cervical radiculopathy 02/07/2018   Chronic pain syndrome 10/03/2017   Closed fracture of second toe of left foot 08/23/2017   DDD (degenerative disc disease), cervical 03/28/2017   Lumbar post-laminectomy syndrome 03/28/2017   Status post lumbar spine surgery for decompression of spinal cord 11/10/2016   Plantar fasciitis 02/03/2016   Left shoulder pain 12/26/2012   Low back pain 09/25/2012   Cervical pain (neck) 09/25/2012    Past Surgical History:  Procedure Laterality Date   ABDOMINAL HYSTERECTOMY     CHOLECYSTECTOMY N/A 06/07/2017   Procedure: LAPAROSCOPIC CHOLECYSTECTOMY;  Surgeon: Kimble Agent, MD;  Location: [facility] OR;  Service: General;  Laterality: N/A;    FOOT SURGERY     LUMBAR LAMINECTOMY/DECOMPRESSION MICRODISCECTOMY Left 11/10/2016   Procedure: Left L5-S1 discectomy;  Surgeon: Burnetta Aures, MD;  Location: [facility] OR;  Service: Orthopedics;  Laterality: Left;  120 mins    OB History     Gravida  1   Para  1   Term      Preterm      AB      Living         SAB      IAB      Ectopic      Multiple      Live Births               Home Medications    Prior to Admission medications   Medication Sig Start Date End Date Taking? Authorizing Provider  promethazine -dextromethorphan (PROMETHAZINE -DM) 6.25-15 MG/5ML syrup Take 5 mLs by mouth at bedtime as needed. 08/05/23  Yes Leath-Warren, Etta PARAS, NP  baclofen  (LIORESAL ) 10 MG tablet as needed. 05/04/18   [provider]  benzonatate (TESSALON) 100 MG capsule Take 100 mg by mouth as needed. 11/27/19   [provider]  brompheniramine-pseudoephedrine-DM 30-2-10 MG/5ML syrup Take 5 mLs by mouth 4 (four) times daily as needed. 10/07/22   Leath-Warren, Etta PARAS, NP  buPROPion (WELLBUTRIN XL) 150 MG 24 hr tablet Take 150 mg by mouth as needed. 08/08/20   [provider]  cetirizine  (ZYRTEC ) 10 MG tablet Take 1 tablet (10 mg total) by mouth daily.  10/07/22   Leath-Warren, Etta PARAS, NP  diphenhydrAMINE  (BENADRYL ) 50 MG tablet Take 1 tablet (50 mg total) by mouth once for 1 dose. Pt to take 50 mg of benadryl  on 12/05/20 at 7 AM. Please call [phone] with any questions. Patient not taking: Reported on 07/04/2023 12/01/20 12/17/22  Johann Sieving, MD  estradiol (ESTRACE) 0.1 MG/GM vaginal cream as needed. 08/14/18   [provider]  fluticasone  (FLONASE ) 50 MCG/ACT nasal spray Place 2 sprays into both nostrils daily. Patient not taking: Reported on 07/04/2023 10/07/22   Leath-Warren, Etta PARAS, NP  gabapentin  (NEURONTIN ) 300 MG capsule Take 300 mg by mouth as needed. 02/25/20   [provider]  HYDROcodone -acetaminophen  (NORCO/VICODIN) 5-325  MG tablet as needed.    [provider]  ipratropium (ATROVENT) 0.03 % nasal spray as needed.    [provider]  meloxicam  (MOBIC ) 15 MG tablet Take 1 tablet (15 mg total) by mouth daily as needed for pain. 12/17/22 12/17/23  Gershon Donnice SAUNDERS, DPM  methocarbamol  (ROBAXIN ) 500 MG tablet as needed.    [provider]  Multiple Vitamins-Minerals (MULTIVITAMIN WITH MINERALS) tablet Take 1 tablet by mouth daily.    [provider]  oxyCODONE -acetaminophen  (PERCOCET/ROXICET) 5-325 MG tablet Take 1 tablet by mouth every 6 (six) hours as needed for up to 10 doses for severe pain. 07/24/21   Cottie Donnice PARAS, MD  thiamine (VITAMIN B-1) 100 MG tablet Take 100 mg by mouth daily.    [provider]  valACYclovir (VALTREX) 500 MG tablet as needed. 11/04/18   [provider]  Vitamin D , Ergocalciferol , (DRISDOL ) 1.25 MG (50000 UNIT) CAPS capsule Take 1 capsule (50,000 Units total) by mouth every 7 (seven) days. 01/10/20   Delores Clayborne LABOR, DO    Family History Family History  Problem Relation Age of Onset   Hyperlipidemia Mother    Hypertension Mother    Sleep apnea Mother    Obesity Mother    Hypertension Father    Hyperlipidemia Father    Heart attack Father    Sudden death Father    Alcoholism Father    Hyperlipidemia Brother    Hypertension Brother    Diabetes Neg Hx     Social History Social History   Tobacco Use   Smoking status: Never   Smokeless tobacco: Never  Vaping Use   Vaping status: Never Used  Substance Use Topics   Alcohol use: Yes    Comment: occ   Drug use: No     Allergies   Iodinated contrast media, Sulfa antibiotics, and Lasix [furosemide]   Review of Systems Review of Systems Per HPI  Physical Exam Triage Vital Signs ED Triage Vitals  Encounter Vitals Group     BP 08/05/23 1421 137/82     Girls Systolic BP Percentile --      Girls Diastolic BP Percentile --      Boys Systolic BP Percentile --       Boys Diastolic BP Percentile --      Pulse Rate 08/05/23 1421 73     Resp 08/05/23 1421 20     Temp 08/05/23 1421 98 F (36.7 C)     Temp Source 08/05/23 1421 Oral     SpO2 08/05/23 1421 95 %     Weight --      Height --      Head Circumference --      Peak Flow --      Pain Score 08/05/23 1419 0  Pain Loc --      Pain Education --      Exclude from Growth Chart --    No data found.  Updated Vital Signs BP 137/82 (BP Location: Right Arm)   Pulse 73   Temp 98 F (36.7 C) (Oral)   Resp 20   SpO2 95%   Visual Acuity Right Eye Distance:   Left Eye Distance:   Bilateral Distance:    Right Eye Near:   Left Eye Near:    Bilateral Near:     Physical Exam Vitals and nursing note reviewed.  Constitutional:      General: She is not in acute distress.    Appearance: Normal appearance. She is well-developed.  HENT:     Head: Normocephalic and atraumatic.     Right Ear: Tympanic membrane, ear canal and external ear normal.     Left Ear: Tympanic membrane, ear canal and external ear normal.     Nose: Congestion present.     Right Turbinates: Enlarged and swollen.     Left Turbinates: Enlarged and swollen.     Right Sinus: No maxillary sinus tenderness or frontal sinus tenderness.     Left Sinus: No maxillary sinus tenderness or frontal sinus tenderness.     Mouth/Throat:     Lips: Pink.     Mouth: Mucous membranes are moist.     Pharynx: Postnasal drip present. No pharyngeal swelling, oropharyngeal exudate, posterior oropharyngeal erythema or uvula swelling.     Comments: Cobblestoning present to posterior oropharynx  Eyes:     Conjunctiva/sclera: Conjunctivae normal.     Pupils: Pupils are equal, round, and reactive to light.  Neck:     Thyroid: No thyromegaly.     Trachea: No tracheal deviation.  Cardiovascular:     Rate and Rhythm: Normal rate and regular rhythm.     Pulses: Normal pulses.     Heart sounds: Normal heart sounds.  Pulmonary:     Effort: Pulmonary  effort is normal.     Breath sounds: Normal breath sounds.  Abdominal:     General: Bowel sounds are normal.     Palpations: Abdomen is soft.     Tenderness: There is no abdominal tenderness.  Musculoskeletal:     Cervical back: Normal range of motion and neck supple.  Skin:    General: Skin is warm and dry.  Neurological:     General: No focal deficit present.     Mental Status: She is alert and oriented to person, place, and time.  Psychiatric:        Behavior: Behavior normal.        Thought Content: Thought content normal.        Judgment: Judgment normal.      UC Treatments / Results  Labs (all labs ordered are listed, but only abnormal results are displayed) Labs Reviewed  POC SOFIA SARS ANTIGEN FIA    EKG   Radiology No results found.  Procedures Procedures (including critical care time)  Medications Ordered in UC Medications - No data to display  Initial Impression / Assessment and Plan / UC Course  I have reviewed the triage vital signs and the nursing notes.  Pertinent labs & imaging results that were available during my care of the patient were reviewed by me and considered in my medical decision making (see chart for details).  COVID test was negative.  On exam, the patient's lung sounds are clear throughout, room air sats at 95%.  She is well-appearing, and is in no acute distress.  Symptoms consistent with a viral URI with cough.  Will provide symptomatic treatment with Promethazine  DM for nighttime.  Supportive care recommendations were provided discussed with the patient to include fluids, rest, over-the-counter analgesics, normal saline nasal spray, and use of a humidifier during sleep.  Discussed indications regarding follow-up.  Patient was in agreement with this plan of care and verbalizes understanding.  All questions were answered.  Patient stable for discharge.  Final Clinical Impressions(s) / UC Diagnoses   Final diagnoses:  Viral URI with  cough     Discharge Instructions      The COVID test was negative. Take medication as prescribed.  Continue the cetirizine , Flonase , and Bromfed that you currently have. Increase fluids and allow for plenty of rest. May take over-the-counter Tylenol  or ibuprofen  as needed for pain, fever, or general discomfort. Recommend normal saline nasal spray throughout the day to help with nasal congestion and runny nose. For your cough, recommend using a humidifier in your bedroom at nighttime during sleep and sleeping elevated on pillows while cough symptoms persist. Symptoms should improve over the next 5 to 7 days.  If symptoms fail to improve, or appear to be worsening, you may follow-up in this clinic or with your primary care physician for further evaluation. Follow-up as needed.     ED Prescriptions     Medication Sig Dispense Auth. Provider   promethazine -dextromethorphan (PROMETHAZINE -DM) 6.25-15 MG/5ML syrup Take 5 mLs by mouth at bedtime as needed. 118 mL Leath-Warren, Etta PARAS, NP      PDMP not reviewed this encounter.   Gilmer Etta PARAS, NP 08/05/23 1452

## 2023-08-05 NOTE — Discharge Instructions (Addendum)
 The COVID test was negative. Take medication as prescribed.  Continue the cetirizine , Flonase , and Bromfed that you currently have. Increase fluids and allow for plenty of rest. May take over-the-counter Tylenol  or ibuprofen  as needed for pain, fever, or general discomfort. Recommend normal saline nasal spray throughout the day to help with nasal congestion and runny nose. For your cough, recommend using a humidifier in your bedroom at nighttime during sleep and sleeping elevated on pillows while cough symptoms persist. Symptoms should improve over the next 5 to 7 days.  If symptoms fail to improve, or appear to be worsening, you may follow-up in this clinic or with your primary care physician for further evaluation. Follow-up as needed.

## 2023-08-05 NOTE — ED Triage Notes (Signed)
 Pt reports cough, headache, nasal congestion x3 days. Pt reports has been taking allergy medication, nasal spray, and cough syrup but reports I just can't get anything up.

## 2023-08-17 ENCOUNTER — Other Ambulatory Visit: Payer: Self-pay | Admitting: Obstetrics and Gynecology

## 2023-08-17 DIAGNOSIS — Z1231 Encounter for screening mammogram for malignant neoplasm of breast: Secondary | ICD-10-CM

## 2023-09-06 ENCOUNTER — Encounter: Admitting: Surgical

## 2023-09-13 ENCOUNTER — Institutional Professional Consult (permissible substitution): Admitting: Plastic Surgery

## 2023-10-07 ENCOUNTER — Encounter: Admitting: Plastic Surgery

## 2023-10-18 ENCOUNTER — Encounter: Admitting: Surgical

## 2023-10-24 ENCOUNTER — Telehealth: Payer: Self-pay | Admitting: Plastic Surgery

## 2023-10-24 ENCOUNTER — Ambulatory Visit: Admitting: Podiatry

## 2023-10-24 DIAGNOSIS — M722 Plantar fascial fibromatosis: Secondary | ICD-10-CM | POA: Diagnosis not present

## 2023-10-24 NOTE — Telephone Encounter (Signed)
 Patient needs to cancel surgery at the moment he father and someone els in her family have a surgery going on around the same time

## 2023-10-24 NOTE — Progress Notes (Unsigned)
 Was having pain, better now Started new vitmains- Held off on injection today Sit for work and feet get puffy- compression

## 2023-10-24 NOTE — Patient Instructions (Signed)

## 2023-10-31 ENCOUNTER — Encounter: Admitting: Student

## 2023-11-01 ENCOUNTER — Encounter: Admitting: Surgical

## 2023-11-24 ENCOUNTER — Ambulatory Visit (HOSPITAL_BASED_OUTPATIENT_CLINIC_OR_DEPARTMENT_OTHER): Admit: 2023-11-24 | Admitting: Plastic Surgery

## 2023-11-24 ENCOUNTER — Encounter (HOSPITAL_BASED_OUTPATIENT_CLINIC_OR_DEPARTMENT_OTHER): Payer: Self-pay

## 2023-11-24 SURGERY — BREAST REDUCTION WITH LIPOSUCTION
Anesthesia: Choice | Site: Breast | Laterality: Bilateral

## 2023-11-30 ENCOUNTER — Encounter: Admitting: Student

## 2023-12-13 ENCOUNTER — Encounter: Admitting: Student

## 2023-12-19 ENCOUNTER — Ambulatory Visit: Attending: Internal Medicine | Admitting: Internal Medicine

## 2023-12-19 ENCOUNTER — Encounter: Payer: Self-pay | Admitting: Internal Medicine

## 2023-12-19 VITALS — BP 120/70 | HR 64 | Ht 64.0 in | Wt 192.8 lb

## 2023-12-19 DIAGNOSIS — R7982 Elevated C-reactive protein (CRP): Secondary | ICD-10-CM | POA: Diagnosis not present

## 2023-12-19 DIAGNOSIS — E66811 Obesity, class 1: Secondary | ICD-10-CM | POA: Diagnosis not present

## 2023-12-19 DIAGNOSIS — Z8249 Family history of ischemic heart disease and other diseases of the circulatory system: Secondary | ICD-10-CM | POA: Diagnosis not present

## 2023-12-19 NOTE — Patient Instructions (Signed)
 Medication Instructions:  Your physician recommends that you continue on your current medications as directed. Please refer to the Current Medication list given to you today.  *If you need a refill on your cardiac medications before your next appointment, please call your pharmacy*  Lab Work: NONE  If you have labs (blood work) drawn today and your tests are completely normal, you will receive your results only by: MyChart Message (if you have MyChart) OR A paper copy in the mail If you have any lab test that is abnormal or we need to change your treatment, we will call you to review the results.  Testing/Procedures: NONE  Follow-Up: At [facility], you and your health needs are our priority.  As part of our continuing mission to provide you with exceptional heart care, our providers are all part of one team.  This team includes your primary Cardiologist (physician) and Advanced Practice Providers or APPs (Physician Assistants and Nurse Practitioners) who all work together to provide you with the care you need, when you need it.  Your next appointment:   2 year(s)  Provider:   Stanly DELENA Leavens, MD

## 2023-12-19 NOTE — Progress Notes (Signed)
 Cardiology Office Note:  .    Date:  12/19/2023  ID:  Melissa Roth, DOB 04-26-1960, MRN [id number] PCP: Katina Pfeiffer, PA-C   HeartCare Providers Cardiologist:  Stanly DELENA Leavens, MD     CC: Primary prevention  History of Present Illness: .    Melissa Roth is a 63 y.o. female who presents for primary prevention of coronary artery disease.  She has been managing elevated high-sensitivity C-reactive protein (CRP) levels and maintains an LDL cholesterol level under 55 mg/dL. She is actively engaged in lifestyle-based weight loss interventions.  She has a strong family history of coronary artery disease, with her father affected by the condition. Her mother, who had breast cancer, is currently in remission, and an aunt has chronic myelogenous leukemia. She is actively involved in caregiving for her mother and a special needs brother, which impacts her ability to focus on her own health.  She has experienced recent weight gain, attributing it to caregiving responsibilities and acknowledges the need to prioritize her health. She engages in walking as exercise and recognizes the importance of strength-based training, especially in the context of menopause and aging.  She works as a production manager at [employer] and is aware of the wellness benefits available through her employer. She recently underwent a wellness check at work with no red flags noted. She has a history of back surgery, which she believes may contribute to her elevated CRP levels. She is not currently on any medications and prefers to avoid them if possible.  Discussed the use of AI scribe software for clinical note transcription with the patient, who gave verbal consent to proceed.   Relevant histories: .  Social  2021: Established with me 2022: CAC score negative 2023: Velton Manus  2024: Velton Manus- LPA normal Husband (Decreased) was a systems analyst ROS: As per HPI.   Studies Reviewed: .      Cardiac Studies & Procedures   ______________________________________________________________________________________________          CT SCANS  CT CARDIAC SCORING (SELF PAY ONLY) 01/14/2020  Addendum 01/14/2020 10:25 AM ADDENDUM REPORT: 01/14/2020 10:22  CLINICAL DATA:  Risk stratification: 63 Year-old African American Female  EXAM: Coronary Calcium Score  TECHNIQUE: The patient was scanned on a [employer]. Axial non-contrast 3 mm slices were carried out through the heart. The data set was analyzed on a dedicated work station and scored using the Agatson method.  FINDINGS: Non-cardiac: See separate report from [facility].  Ascending Aorta: Normal caliber.  Pericardium: Normal.  Coronary arteries: Normal origins.  Coronary calcium score of 0. This was 1st percentile for age, gender, and race matched controls.  IMPRESSION: 1. Coronary calcium score of 0. This was 1st percentile for age, gender, and race matched controls.  Stanly Leavens, MD   Electronically Signed By: Stanly Leavens MD On: 01/14/2020 10:22  Narrative CLINICAL DATA:  63 year old female family history of coronary artery disease. Screening study.  EXAM: OVER-READ INTERPRETATION  CT CHEST  The following report is an over-read performed by radiologist Dr. Ester Sides of [facility] on 01/14/2020. This over-read does not include interpretation of cardiac or coronary anatomy or pathology. The coronary calcium score/coronary CTA interpretation by the cardiologist is attached.  TECHNIQUE: CT heart, please refer to outside report for details of image acquisition.  COMPARISON:  None.  FINDINGS: Vascular: Normal caliber aorta with no significant atherosclerotic calcifications. The heart is normal in size.  Mediastinum/Nodes: No enlarged mediastinal or axillary lymph  nodes. Thyroid gland, trachea, and esophagus demonstrate no  significant findings.  Lungs/Pleura: Lungs are clear. No pleural effusion or pneumothorax.  Upper Abdomen: No acute abnormality.  Musculoskeletal: No chest wall mass or suspicious bone lesions identified.  IMPRESSION: 1. Outside facility CT heart for coronary calcium score. 2. Visualized portions of the thorax and upper abdomen within normal limits. No acute abnormality.  Ester Sides, MD  [facility]  [facility]  Electronically Signed: By: Ester Sides MD On: 01/14/2020 08:58     ______________________________________________________________________________________________       Physical Exam:    VS:  BP 120/70 (BP Location: Left Arm, Patient Position: Sitting, Cuff Size: Normal)   Pulse 64   Ht 5' 4 (1.626 m)   Wt 192 lb 12.8 oz (87.5 kg)   SpO2 100%   BMI 33.09 kg/m    Wt Readings from Last 3 Encounters:  12/19/23 192 lb 12.8 oz (87.5 kg)  07/04/23 196 lb 3.2 oz (89 kg)  12/17/22 195 lb (88.5 kg)    Gen: no distress, Obese   Neck: No JVD Cardiac: No Rubs or Gallops, soft systolic Murmur, RRR +2 radial pulses Respiratory: Clear to auscultation bilaterally, normal effort, normal  respiratory rate GI: Soft, nontender, non-distended  MS: No  edema;  moves all extremities Integument: Skin feels warm Neuro:  At time of evaluation, alert and oriented to person/place/time/situation  Psych: Normal affect, patient feels ok     ASSESSMENT AND PLAN: .    Primary prevention of coronary artery disease Strong family history of coronary artery disease. LDL cholesterol is under 55, and there is no evidence of coronary artery calcification. High sensitivity CRP is elevated, indicating inflammation. No current coronary artery disease or calcifications. Lpa normal - Continue current lifestyle modifications including exercise and weight management. - Will reassess in a couple of years unless new symptoms or conditions  arise.  Elevated C-reactive protein Elevated high sensitivity CRP indicating inflammation. No specific coronary artery disease or calcifications. Statin therapy could decrease inflammation. Colchicine is an option but may cause diarrhea and other side effects. She prefers to avoid medication after SDM.  Obesity Weight gain attributed to caregiving responsibilities and lifestyle factors. Emphasis on the importance of exercise, including cardiovascular and strength-based training, to manage weight and improve overall health. She acknowledges the need to work on [employer]. - Encouraged regular exercise, including cardiovascular and strength-based training. - Advised on lifestyle modifications to support weight loss.  Discussed graduation- after SMD will see in two years unless SOB (soft systolic murmur with no sx- would get echo) or new cardiac sx  Stanly Leavens, MD FASE [facility] Cardiologist [facility]  [address], [address] [place], [place] [address] [phone]  8:38 AM

## 2023-12-27 ENCOUNTER — Encounter: Admitting: Plastic Surgery

## 2024-02-20 ENCOUNTER — Ambulatory Visit
